# Patient Record
Sex: Female | Born: 1935
Health system: Southern US, Community
[De-identification: ages and names within clinical notes are randomized; demographics above are authoritative.]

## PROBLEM LIST (undated history)

## (undated) DIAGNOSIS — I739 Peripheral vascular disease, unspecified: Secondary | ICD-10-CM

## (undated) DIAGNOSIS — I2510 Atherosclerotic heart disease of native coronary artery without angina pectoris: Secondary | ICD-10-CM

## (undated) DIAGNOSIS — I495 Sick sinus syndrome: Secondary | ICD-10-CM

## (undated) DIAGNOSIS — E039 Hypothyroidism, unspecified: Secondary | ICD-10-CM

## (undated) DIAGNOSIS — I10 Essential (primary) hypertension: Secondary | ICD-10-CM

## (undated) DIAGNOSIS — G729 Myopathy, unspecified: Secondary | ICD-10-CM

## (undated) DIAGNOSIS — K5792 Diverticulitis of intestine, part unspecified, without perforation or abscess without bleeding: Secondary | ICD-10-CM

## (undated) DIAGNOSIS — C801 Malignant (primary) neoplasm, unspecified: Secondary | ICD-10-CM

## (undated) DIAGNOSIS — E785 Hyperlipidemia, unspecified: Secondary | ICD-10-CM

## (undated) DIAGNOSIS — T8859XA Other complications of anesthesia, initial encounter: Secondary | ICD-10-CM

## (undated) DIAGNOSIS — I6529 Occlusion and stenosis of unspecified carotid artery: Secondary | ICD-10-CM

## (undated) DIAGNOSIS — K148 Other diseases of tongue: Secondary | ICD-10-CM

## (undated) DIAGNOSIS — I499 Cardiac arrhythmia, unspecified: Secondary | ICD-10-CM

## (undated) HISTORY — PX: CATARACT EXTRACTION, BILATERAL: SHX1313

## (undated) HISTORY — DX: Peripheral vascular disease, unspecified: I73.9

## (undated) HISTORY — PX: SALIVARY GLAND SURGERY: SHX768

## (undated) HISTORY — PX: CORONARY ANGIOPLASTY: SHX604

## (undated) HISTORY — DX: Hyperlipidemia, unspecified: E78.5

## (undated) HISTORY — PX: SKIN CANCER EXCISION: SHX779

## (undated) HISTORY — DX: Cardiac arrhythmia, unspecified: I49.9

## (undated) HISTORY — DX: Essential (primary) hypertension: I10

## (undated) HISTORY — DX: Myopathy, unspecified: G72.9

## (undated) HISTORY — PX: CORONARY STENT PLACEMENT: SHX6853

## (undated) HISTORY — DX: Other diseases of tongue: K14.8

## (undated) HISTORY — DX: Occlusion and stenosis of unspecified carotid artery: I65.29

## (undated) HISTORY — PX: SALPINGECTOMY: SHX328

## (undated) HISTORY — PX: COLONOSCOPY: SHX174

## (undated) HISTORY — DX: Atherosclerotic heart disease of native coronary artery without angina pectoris: I25.10

## (undated) HISTORY — DX: Sick sinus syndrome: I49.5

## (undated) HISTORY — PX: APPENDECTOMY: SHX54

## (undated) HISTORY — PX: MULTIPLE TOOTH EXTRACTIONS: SHX2053

## (undated) HISTORY — PX: CAROTID ENDARTERECTOMY: SUR193

---

## 2016-08-09 ENCOUNTER — Telehealth: Payer: Self-pay | Admitting: Cardiology

## 2016-08-09 NOTE — Telephone Encounter (Signed)
Received records from [facility] for appointment on 09/01/16 with Dr Percival Spanish.  Records given to [facility] (medical records) for Dr Hochrein's schedule on 09/01/16. lp

## 2016-08-31 NOTE — Progress Notes (Signed)
Cardiology Office Note   Date:  09/03/2016   ID:  Jasmin Crane, DOB 1932/03/07, MRN [id number]  PCP:  No primary care provider on file.  Cardiologist:   Minus Breeding, MD  Referring:  Self  Chief Complaint  Patient presents with  . Coronary Artery Disease      History of Present Illness: Jasmin Crane is a 83 y.o. female who presents for evaluation of coronary artery disease. She was living in [place] and is now moving here to live with her daughter. First diagnosis of coronary disease was apparently in 1998. She had stenting of her right coronary artery that time. She's had a well preserved ejection fraction over the years. Her last echocardiogram in August of last year demonstrated an EF of 60%. She does have some aortic sclerosis with moderate aortic insufficiency area she does have carotid stenosis with right 80% stenosis and left 15-49% stenosis. She has some moderately had normal ABIs left greater than right. Does have some symptoms of claudication. She's never had any neurologic symptoms area she had a stress perfusion study in August 2017 this demonstrated moderate to high suspicion for ischemia. However, this was followed up with coronary CTA. She had a long calcification of the left main and proximal LAD but the severity of this could not be quantified secondary to motion artifact. There was moderate stenosis of the left circumflex. The right coronary artery could not be assessed secondary to artifact. It was decided however manage her medically. He was suggested. She will undergo carotid endarterectomy she would likely need cardiac catheterization.  She comes today to establish as a new patient. She does quite well. She was walking when she was in [place]. She's not started that yet here. However, with her activities she denies any cardiovascular symptoms. The patient denies any new symptoms such as chest discomfort, neck or arm discomfort. There has been no new shortness of  breath, PND or orthopnea. There have been no reported palpitations, presyncope or syncope.   Past Medical History:  Diagnosis Date  . Arrhythmia   . Carotid artery occlusion   . Claudication of lower extremity ([place])   . Coronary artery disease   . Hyperlipidemia   . Hypertension   . Myopathy   . Paralysis of tongue   . Sick sinus syndrome [facility])     Past Surgical History:  Procedure Laterality Date  . APPENDECTOMY    . CAROTID ENDARTERECTOMY    . CATARACT EXTRACTION, BILATERAL    . CESAREAN SECTION    . CORONARY ANGIOPLASTY    . SALPINGECTOMY       Current Outpatient Prescriptions  Medication Sig Dispense Refill  . amLODipine (NORVASC) 5 MG tablet Take 5 mg by mouth daily.    [name] aspirin 325 MG tablet Take 325 mg by mouth daily.    [name] ezetimibe (ZETIA) 10 MG tablet Take 10 mg by mouth daily.    [name] levothyroxine (SYNTHROID, LEVOTHROID) 100 MCG tablet Take 1 tablet (100 mcg total) by mouth daily before breakfast. 90 tablet 3  . metoprolol succinate (TOPROL-XL) 25 MG 24 hr tablet Take 25 mg by mouth daily.    . rosuvastatin (CRESTOR) 40 MG tablet Take 40 mg by mouth daily.     No current facility-administered medications for this visit.     Allergies:   Cozaar [losartan]; Pletaal [cilostazol]; and Scallops [shellfish allergy]    Social History:  The patient  reports that she has quit smoking. She does not  have any smokeless tobacco history on file.   Family History:  The patient's family history includes Arthritis in her brother; Cancer in her mother; Heart attack (age of onset: 50) in her son; Heart disease (age of onset: 31) in her father; Heart failure in her sister; Lung cancer in her brother; Pancreatic cancer in her brother; Stroke in her mother.    ROS:  Please see the history of present illness.   Otherwise, review of systems are positive for none.   All other systems are reviewed and negative.    PHYSICAL EXAM: VS:  BP (!) 156/72 (BP Location: Left Arm, Patient  Position: Sitting, Cuff Size: Normal)   Pulse 82   Ht 5\' 2"  (1.575 m)   Wt 115 lb 6 oz (52.3 kg)   BMI 21.10 kg/m  , BMI Body mass index is 21.1 kg/m. GENERAL:  Well appearing HEENT:  Pupils equal round and reactive, fundi not visualized, oral mucosa unremarkable NECK:  No jugular venous distention, waveform within normal limits, carotid upstroke brisk and symmetric, bilateral bruits, no thyromegaly LYMPHATICS:  No cervical, inguinal adenopathy LUNGS:  Clear to auscultation bilaterally BACK:  No CVA tenderness CHEST:  Unremarkable HEART:  PMI not displaced or sustained,S1 and S2 within normal limits, no S3, no S4, no clicks, no rubs, no murmurs ABD:  Flat, positive bowel sounds normal in frequency in pitch, abdominal bruits, no rebound, no guarding, no midline pulsatile mass, no hepatomegaly, no splenomegaly EXT:  2 plus pulses throughout, no edema, no cyanosis no clubbing SKIN:  No rashes no nodules NEURO:  Cranial nerves II through XII grossly intact, motor grossly intact throughout PSYCH:  Cognitively intact, oriented to person place and time    EKG:  EKG is ordered today. The ekg ordered today demonstrates sinus rhythm with sinus arrhythmia, axis within normal limits, intervals within normal limits, diffuse nonspecific T-wave flattening.   Recent Labs: 09/01/2016: ALT 16; BUN 18; Creat 0.93; Hemoglobin 12.7; Platelets 175; Potassium 4.3; Sodium 142; TSH 0.20    Lipid Panel    Component Value Date/Time   CHOL 162 09/01/2016 1226   TRIG 75 09/01/2016 1226   HDL 82 09/01/2016 1226   CHOLHDL 2.0 09/01/2016 1226   VLDL 15 09/01/2016 1226   LDLCALC 65 09/01/2016 1226      Wt Readings from Last 3 Encounters:  09/01/16 115 lb 6 oz (52.3 kg)      Other studies Reviewed: Additional studies/ records that were reviewed today include: Outside extensive records. Review of the above records demonstrates:  Please see elsewhere in the note.     ASSESSMENT AND PLAN:  CAD:  The  patient has coronary disease as described. She's not having any ongoing symptoms. I will continue with aggressive risk reduction.  She can reduce her aspirin 81 mg daily.  PVD:  She has carotid stenosis as described. I'll plan repeat carotid scanning in 6 months.  DYSLIPIDEMIA:  She'll have a lipid profile today with other labs. The goal will be an LDL in the 70s.  HTN:  Her blood pressure is well controlled and she'll continue the meds as listed.  HYPOTHYROIDISM:  I will check a TSH.   AI:  I will follow up with echoes in the future.  Surprisingly I don't hear a murmur today.  Current medicines are reviewed at length with the patient today.  The patient does not have concerns regarding medicines.  The following changes have been made:  no change  Labs/ tests ordered today  include:   Orders Placed This Encounter  Procedures  . CBC  . TSH  . COMPLETE METABOLIC PANEL WITH GFR  . Lipid panel  . EKG 12-Lead     Disposition:   FU with me in 3 months.      Signed, Minus Breeding, MD  09/03/2016 8:31 PM    [place]

## 2016-09-01 ENCOUNTER — Ambulatory Visit (INDEPENDENT_AMBULATORY_CARE_PROVIDER_SITE_OTHER): Payer: Medicare Other | Admitting: Cardiology

## 2016-09-01 ENCOUNTER — Encounter: Payer: Self-pay | Admitting: Cardiology

## 2016-09-01 VITALS — BP 156/72 | HR 82 | Ht 62.0 in | Wt 115.4 lb

## 2016-09-01 DIAGNOSIS — E785 Hyperlipidemia, unspecified: Secondary | ICD-10-CM | POA: Diagnosis not present

## 2016-09-01 DIAGNOSIS — I2510 Atherosclerotic heart disease of native coronary artery without angina pectoris: Secondary | ICD-10-CM

## 2016-09-01 DIAGNOSIS — R5383 Other fatigue: Secondary | ICD-10-CM | POA: Diagnosis not present

## 2016-09-01 DIAGNOSIS — Z79899 Other long term (current) drug therapy: Secondary | ICD-10-CM | POA: Diagnosis not present

## 2016-09-01 LAB — COMPLETE METABOLIC PANEL WITH GFR
ALBUMIN: 3.9 g/dL (ref 3.6–5.1)
ALK PHOS: 51 U/L (ref 33–130)
ALT: 16 U/L (ref 6–29)
AST: 28 U/L (ref 10–35)
BUN: 18 mg/dL (ref 7–25)
CALCIUM: 9.1 mg/dL (ref 8.6–10.4)
CHLORIDE: 107 mmol/L (ref 98–110)
CO2: 28 mmol/L (ref 20–31)
Creat: 0.93 mg/dL — ABNORMAL HIGH (ref 0.60–0.88)
GFR, Est African American: 66 mL/min (ref >=60)
GFR, Est Non African American: 57 mL/min — ABNORMAL LOW (ref >=60)
GLUCOSE: 86 mg/dL (ref 65–99)
Potassium: 4.3 mmol/L (ref 3.5–5.3)
SODIUM: 142 mmol/L (ref 135–146)
Total Bilirubin: 0.5 mg/dL (ref 0.2–1.2)
Total Protein: 6.2 g/dL (ref 6.1–8.1)

## 2016-09-01 LAB — LIPID PANEL
CHOL/HDL RATIO: 2 ratio (ref <5.0)
CHOLESTEROL: 162 mg/dL (ref <200)
HDL: 82 mg/dL (ref >50)
LDL Cholesterol: 65 mg/dL (ref <100)
Triglycerides: 75 mg/dL (ref <150)
VLDL: 15 mg/dL (ref <30)

## 2016-09-01 LAB — CBC
HEMATOCRIT: 40.9 % (ref 35.0–45.0)
HEMOGLOBIN: 12.7 g/dL (ref 11.7–15.5)
MCH: 28.1 pg (ref 27.0–33.0)
MCHC: 31.1 g/dL — ABNORMAL LOW (ref 32.0–36.0)
MCV: 90.5 fL (ref 80.0–100.0)
MPV: 10.5 fL (ref 7.5–12.5)
Platelets: 175 10*3/uL (ref 140–400)
RBC: 4.52 MIL/uL (ref 3.80–5.10)
RDW: 14.3 % (ref 11.0–15.0)
WBC: 5.2 10*3/uL (ref 3.8–10.8)

## 2016-09-01 LAB — TSH: TSH: 0.2 mIU/L — ABNORMAL LOW

## 2016-09-01 MED ORDER — LEVOTHYROXINE SODIUM 100 MCG PO TABS: 100.0000 ug | ORAL_TABLET | Freq: Every day | ORAL | 3 refills | Status: DC

## 2016-09-01 NOTE — Patient Instructions (Signed)
Medication Instructions:  Continue current medications  Labwork: CBC, CMP, TSH, Fasting Lipids  Testing/Procedures: None Ordered  Follow-Up: Your physician recommends that you schedule a follow-up appointment in: 3 Months   Any Other Special Instructions Will Be Listed Below (If Applicable).    If you need a refill on your cardiac medications before your next appointment, please call your pharmacy.

## 2016-09-03 ENCOUNTER — Encounter: Payer: Self-pay | Admitting: Cardiology

## 2016-09-06 ENCOUNTER — Encounter: Payer: Self-pay | Admitting: Cardiology

## 2016-09-15 ENCOUNTER — Telehealth: Payer: Self-pay | Admitting: *Deleted

## 2016-09-15 DIAGNOSIS — Z1329 Encounter for screening for other suspected endocrine disorder: Secondary | ICD-10-CM

## 2016-09-15 DIAGNOSIS — E079 Disorder of thyroid, unspecified: Secondary | ICD-10-CM

## 2016-09-15 NOTE — Telephone Encounter (Signed)
-----   Message from Minus Breeding, MD sent at 09/10/2016 10:07 AM EST ----- Labs OK except TSH is low.  Please draw T3, T4.  Call Ms. Cutsforth with the results

## 2016-09-15 NOTE — Telephone Encounter (Signed)
Spoke with pt about her blood work, informed pt Dr Percival Spanish will like to check T3 T4 lab, pt stated that she is going to [place] on Saturday and will be back February 3rd, told pt she can get done when she returned, pt voice understanding

## 2016-10-10 LAB — T4, FREE: Free T4: 1.2 ng/dL (ref 0.8–1.8)

## 2016-10-10 LAB — T3, FREE: T3, Free: 2.4 pg/mL (ref 2.3–4.2)

## 2016-12-03 NOTE — Progress Notes (Signed)
Cardiology Office Note   Date:  12/04/2016   ID:  Jasmin Crane, DOB 04-27-1932, MRN [id number]  PCP:  No PCP Per Patient  Cardiologist:   Minus Breeding, MD  Referring:  Self  Chief Complaint  Patient presents with  . Leg Pain      History of Present Illness: Jasmin Crane is a 83 y.o. female who presents for evaluation of coronary artery disease. She was living in [place] and has moved here to live with her daughter. Her first diagnosis of coronary disease was apparently in 1998. She had stenting of her right coronary artery that time. She's had a well preserved ejection fraction over the years. Her last echocardiogram in August of last year demonstrated an EF of 60%. She does have some aortic sclerosis with moderate aortic insufficiency.  She does have carotid stenosis with right 80% stenosis and left 25 -49% stenosis. She has some moderately abnormal ABIs left greater than right. She had a stress perfusion study in August 2017 that demonstrated moderate to high suspicion for ischemia. However, this was followed up with coronary CTA. She had a long calcification of the left main and proximal LAD but the severity of this could not be quantified secondary to motion artifact. There was moderate stenosis of the left circumflex. The right coronary artery could not be assessed secondary to artifact. It was decided however manage her medically. It was suggested that if she were to undergo carotid endarterectomy she would likely need cardiac catheterization.  This is her second visit with me.  She has been doing well except for leg pain.  This seems to happen more at night with cramping in her foot. It was in her calf previously. She does have some symptoms of claudication as well but she is not walking as much as she was in [place] and doesn't know if this is worse or better.  She denies any chest pressure, neck or arm discomfort. She's had no new shortness of breath, PND or orthopnea. She's had  no weight gain or edema.   Past Medical History:  Diagnosis Date  . Arrhythmia   . Carotid artery occlusion   . Claudication of lower extremity ([place])   . Coronary artery disease   . Hyperlipidemia   . Hypertension   . Myopathy   . Paralysis of tongue   . Sick sinus syndrome [facility])     Past Surgical History:  Procedure Laterality Date  . APPENDECTOMY    . CAROTID ENDARTERECTOMY    . CATARACT EXTRACTION, BILATERAL    . CESAREAN SECTION    . CORONARY ANGIOPLASTY    . SALPINGECTOMY       Current Outpatient Prescriptions  Medication Sig Dispense Refill  . amLODipine (NORVASC) 5 MG tablet Take 5 mg by mouth daily.    [name] aspirin 325 MG tablet Take 325 mg by mouth daily.    [name] ezetimibe (ZETIA) 10 MG tablet Take 10 mg by mouth daily.    [name] levothyroxine (SYNTHROID, LEVOTHROID) 100 MCG tablet Take 1 tablet (100 mcg total) by mouth daily before breakfast. 90 tablet 3  . metoprolol succinate (TOPROL-XL) 25 MG 24 hr tablet Take 25 mg by mouth daily.    . rosuvastatin (CRESTOR) 40 MG tablet Take 40 mg by mouth daily.     No current facility-administered medications for this visit.     Allergies:   Cozaar [losartan]; Pletaal [cilostazol]; and Scallops [shellfish allergy]     ROS:  Please see the  history of present illness.   Otherwise, review of systems are positive for foot cramping.   All other systems are reviewed and negative.    PHYSICAL EXAM: VS:  BP 122/62   Pulse 68   Ht 5\' 2"  (1.575 m)   Wt 116 lb 6.4 oz (52.8 kg)   SpO2 96%   BMI 21.29 kg/m  , BMI Body mass index is 21.29 kg/m. GENERAL:  Well appearing NECK:  No jugular venous distention, waveform within normal limits, carotid upstroke brisk and symmetric, bilateral bruits, no thyromegaly LUNGS:  Clear to auscultation bilaterally BACK:  No CVA tenderness CHEST:  Unremarkable HEART:  PMI not displaced or sustained,S1 and S2 within normal limits, no S3, no S4, no clicks, no rubs, no murmurs ABD:  Flat, positive  bowel sounds normal in frequency in pitch, positive abdominal bruits, no rebound, no guarding, no midline pulsatile mass, no hepatomegaly, no splenomegaly EXT:  2 plus pulses throughout, no edema, no cyanosis no clubbing    EKG:  EKG is not ordered today.   Recent Labs: 09/01/2016: ALT 16; BUN 18; Creat 0.93; Hemoglobin 12.7; Platelets 175; Potassium 4.3; Sodium 142; TSH 0.20    Lipid Panel    Component Value Date/Time   CHOL 162 09/01/2016 1226   TRIG 75 09/01/2016 1226   HDL 82 09/01/2016 1226   CHOLHDL 2.0 09/01/2016 1226   VLDL 15 09/01/2016 1226   LDLCALC 65 09/01/2016 1226      Wt Readings from Last 3 Encounters:  12/04/16 116 lb 6.4 oz (52.8 kg)  09/01/16 115 lb 6 oz (52.3 kg)      Other studies Reviewed: Additional studies/ records that were reviewed today include: Labs Review of the above records demonstrates:  See below   ASSESSMENT AND PLAN:  CAD:    The patient has coronary disease as described. She's not having any ongoing symptoms. I will continue with aggressive risk reduction.  She will continue with meds as listed.   PVD:  She has carotid stenosis as described. I'll plan repeat carotid Dopplers.  I will also order ABIs  DYSLIPIDEMIA:     Her LDL is at target.  She will remain on the meds as listed.  HTN:   Her blood pressure is well controlled and she'll continue the meds as listed.  No change in therapyl  CLAUDICATION:  I will check ABIs as above.   HYPOTHYROIDISM:   T3 and T4 were OK.    AI:     I will follow up with echoes in the future.   She also has moderate TR and MR that we will follow.   Current medicines are reviewed at length with the patient today.  The patient does not have concerns regarding medicines.  The following changes have been made:   None  Labs/ tests ordered today include:    No orders of the defined types were placed in this encounter.    Disposition:   FU with me in 6 months.      Signed, Minus Breeding, MD    12/04/2016 11:42 AM    [facility]

## 2016-12-04 ENCOUNTER — Encounter: Payer: Self-pay | Admitting: Cardiology

## 2016-12-04 ENCOUNTER — Ambulatory Visit (INDEPENDENT_AMBULATORY_CARE_PROVIDER_SITE_OTHER): Payer: Medicare Other | Admitting: Cardiology

## 2016-12-04 VITALS — BP 122/62 | HR 68 | Ht 62.0 in | Wt 116.4 lb

## 2016-12-04 DIAGNOSIS — I10 Essential (primary) hypertension: Secondary | ICD-10-CM | POA: Diagnosis not present

## 2016-12-04 DIAGNOSIS — I351 Nonrheumatic aortic (valve) insufficiency: Secondary | ICD-10-CM | POA: Diagnosis not present

## 2016-12-04 DIAGNOSIS — I2510 Atherosclerotic heart disease of native coronary artery without angina pectoris: Secondary | ICD-10-CM | POA: Diagnosis not present

## 2016-12-04 DIAGNOSIS — I6523 Occlusion and stenosis of bilateral carotid arteries: Secondary | ICD-10-CM | POA: Diagnosis not present

## 2016-12-04 DIAGNOSIS — I739 Peripheral vascular disease, unspecified: Secondary | ICD-10-CM

## 2016-12-04 NOTE — Patient Instructions (Signed)
Medication Instructions:  Continue current medications  Labwork: None Ordered  Testing/Procedures: Your physician has requested that you have a carotid duplex. This test is an ultrasound of the carotid arteries in your neck. It looks at blood flow through these arteries that supply the brain with blood. Allow one hour for this exam. There are no restrictions or special instructions.  Your physician has requested that you have an ankle brachial index (ABI). During this test an ultrasound and blood pressure cuff are used to evaluate the arteries that supply the arms and legs with blood. Allow thirty minutes for this exam. There are no restrictions or special instructions.  Follow-Up: Your physician wants you to follow-up in: 6 Months. You will receive a reminder letter in the mail two months in advance. If you don't receive a letter, please call our office to schedule the follow-up appointment.   Any Other Special Instructions Will Be Listed Below (If Applicable).   If you need a refill on your cardiac medications before your next appointment, please call your pharmacy.

## 2017-01-29 ENCOUNTER — Other Ambulatory Visit: Payer: Self-pay | Admitting: *Deleted

## 2017-01-29 MED ORDER — AMLODIPINE BESYLATE 5 MG PO TABS: 5.0000 mg | ORAL_TABLET | Freq: Every day | ORAL | 2 refills | Status: DC

## 2017-02-08 ENCOUNTER — Other Ambulatory Visit: Payer: Self-pay | Admitting: Cardiology

## 2017-02-12 ENCOUNTER — Other Ambulatory Visit: Payer: Self-pay | Admitting: Cardiology

## 2017-02-12 DIAGNOSIS — I739 Peripheral vascular disease, unspecified: Secondary | ICD-10-CM

## 2017-02-20 ENCOUNTER — Ambulatory Visit (HOSPITAL_COMMUNITY)
Admission: RE | Admit: 2017-02-20 | Discharge: 2017-02-20 | Disposition: A | Discharge status: Home / Self Care | Payer: Medicare Other | Source: Ambulatory Visit | Attending: Cardiovascular Disease | Admitting: Cardiovascular Disease

## 2017-02-20 ENCOUNTER — Ambulatory Visit (HOSPITAL_COMMUNITY)
Admission: RE | Admit: 2017-02-20 | Discharge: 2017-02-20 | Disposition: A | Discharge status: Home / Self Care | Payer: Medicare Other | Source: Ambulatory Visit | Attending: Cardiology | Admitting: Cardiology

## 2017-02-20 DIAGNOSIS — I6523 Occlusion and stenosis of bilateral carotid arteries: Secondary | ICD-10-CM | POA: Insufficient documentation

## 2017-02-20 DIAGNOSIS — I739 Peripheral vascular disease, unspecified: Secondary | ICD-10-CM | POA: Diagnosis not present

## 2017-02-20 DIAGNOSIS — I70201 Unspecified atherosclerosis of native arteries of extremities, right leg: Secondary | ICD-10-CM | POA: Diagnosis not present

## 2017-02-20 DIAGNOSIS — I700 Atherosclerosis of aorta: Secondary | ICD-10-CM | POA: Insufficient documentation

## 2017-02-20 DIAGNOSIS — I708 Atherosclerosis of other arteries: Secondary | ICD-10-CM | POA: Insufficient documentation

## 2017-02-20 DIAGNOSIS — I70292 Other atherosclerosis of native arteries of extremities, left leg: Secondary | ICD-10-CM | POA: Insufficient documentation

## 2017-02-26 ENCOUNTER — Telehealth: Payer: Self-pay | Admitting: *Deleted

## 2017-02-26 NOTE — Telephone Encounter (Addendum)
-----   Message from Minus Breeding, MD sent at 02/24/2017  1:25 PM EDT ----- Abnormal ABIs.  Please call patient and refer to Dr. Gwenlyn Found for further discussion of her claudication and abnormal ABIs.   Left message for pt to call

## 2017-02-27 NOTE — Telephone Encounter (Signed)
New Message     Pt is following up with Hilda Blades for message left

## 2017-02-27 NOTE — Telephone Encounter (Signed)
Unable to reach pt or leave a message  

## 2017-03-01 NOTE — Telephone Encounter (Signed)
Spoke with pt, aware of dopplers results and follow up scheduled for eval with dr berry at patient convenience. Copy of dopplers placed at the front desk for patient pick up.

## 2017-03-06 ENCOUNTER — Encounter: Payer: Self-pay | Admitting: Cardiovascular Disease

## 2017-03-06 ENCOUNTER — Ambulatory Visit (INDEPENDENT_AMBULATORY_CARE_PROVIDER_SITE_OTHER): Payer: Medicare Other | Admitting: Cardiovascular Disease

## 2017-03-06 VITALS — BP 141/69 | HR 75 | Ht 62.0 in | Wt 113.0 lb

## 2017-03-06 DIAGNOSIS — E7800 Pure hypercholesterolemia, unspecified: Secondary | ICD-10-CM

## 2017-03-06 DIAGNOSIS — I6523 Occlusion and stenosis of bilateral carotid arteries: Secondary | ICD-10-CM

## 2017-03-06 DIAGNOSIS — I10 Essential (primary) hypertension: Secondary | ICD-10-CM | POA: Diagnosis not present

## 2017-03-06 DIAGNOSIS — I739 Peripheral vascular disease, unspecified: Secondary | ICD-10-CM | POA: Diagnosis not present

## 2017-03-06 DIAGNOSIS — I2583 Coronary atherosclerosis due to lipid rich plaque: Secondary | ICD-10-CM | POA: Diagnosis not present

## 2017-03-06 DIAGNOSIS — E785 Hyperlipidemia, unspecified: Secondary | ICD-10-CM | POA: Insufficient documentation

## 2017-03-06 DIAGNOSIS — I2510 Atherosclerotic heart disease of native coronary artery without angina pectoris: Secondary | ICD-10-CM

## 2017-03-06 DIAGNOSIS — I779 Disorder of arteries and arterioles, unspecified: Secondary | ICD-10-CM | POA: Insufficient documentation

## 2017-03-06 NOTE — Assessment & Plan Note (Signed)
Jasmin Crane has had left carotid endarterectomy 1998 [place]. Recent Dopplers performed 02/20/17 showed moderate bilateral ICA stenosis probably in the 60% range followed by Dr. Percival Spanish

## 2017-03-06 NOTE — Patient Instructions (Signed)
Medication Instructions: Your physician recommends that you continue on your current medications as directed. Please refer to the Current Medication list given to you today.  Testing: Your physician has requested that you have a lower extremity arterial duplex. During this test, ultrasound is used to evaluate arterial blood flow in the legs. Allow one hour for this exam. There are no restrictions or special instructions.  Your physician has requested that you have an ankle brachial index (ABI). During this test an ultrasound and blood pressure cuff are used to evaluate the arteries that supply the arms and legs with blood. Allow thirty minutes for this exam. There are no restrictions or special instructions. (In 1 year)   Follow-Up: Your physician recommends that you schedule a follow-up appointment as needed with Dr. Gwenlyn Found.

## 2017-03-06 NOTE — Progress Notes (Signed)
03/06/2017 Jasmin Crane   February 26, 1932  [id number]  Primary Physician Patient, No Pcp Per Primary Cardiologist: Lorretta Harp MD Jasmin Crane  HPI:  [name] is a delightful 83 year old thin-appearing widowed Caucasian female mother of 2 children (accompanied by her daughter [name] ), a mother to 2 grandchildren who recently relocated from [place] 6 months ago to be closer to her daughter. She was referred to me by Dr. Percival Spanish for peripheral vascular evaluation because of leg pain. She has a history of coronary disease well outlined in her previous office note as well as carotid artery disease now 20 years status post carotid endarterectomy on the left side in the [place]. She does complain of bilateral calf claudication as well as nocturnal pain probably related to restless leg syndrome. Recent Dopplers performed 02/20/17 revealed ABIs of 8.6 range bilaterally with moderately elevated iliac velocities and SFA occlusions.   Current Outpatient Prescriptions  Medication Sig Dispense Refill  . amLODipine (NORVASC) 5 MG tablet Take 1 tablet (5 mg total) by mouth daily. 90 tablet 2  . aspirin 325 MG tablet Take 325 mg by mouth daily.    [name] ezetimibe (ZETIA) 10 MG tablet Take 10 mg by mouth daily.    [name] levothyroxine (SYNTHROID, LEVOTHROID) 100 MCG tablet Take 1 tablet (100 mcg total) by mouth daily before breakfast. 90 tablet 3  . metoprolol succinate (TOPROL-XL) 25 MG 24 hr tablet TAKE 1 TABLET BY MOUTH EVERY DAY 90 tablet 1  . rosuvastatin (CRESTOR) 40 MG tablet TAKE 1 TABLET BY MOUTH AT BEDTIME 90 tablet 1   No current facility-administered medications for this visit.     Allergies  Allergen Reactions  . Cozaar [Losartan] Nausea Only  . Pletaal [Cilostazol] Palpitations  . Scallops [Shellfish Allergy] Swelling    Social History   Social History  . Marital status: Widowed    Spouse name: [name]  . Number of children: 3  . Years of education: [name]   Occupational  History  .      Worked for [employer] History Main Topics  . Smoking status: Former Research scientist (life sciences)  . Smokeless tobacco: Never Used     Comment: Quit tobacco 25 years ago  . Alcohol use Not on file  . Drug use: Unknown  . Sexual activity: Not on file   Other Topics Concern  . Not on file   Social History Narrative  . No narrative on file     Review of Systems: General: negative for chills, fever, night sweats or weight changes.  Cardiovascular: negative for chest pain, dyspnea on exertion, edema, orthopnea, palpitations, paroxysmal nocturnal dyspnea or shortness of breath Dermatological: negative for rash Respiratory: negative for cough or wheezing Urologic: negative for hematuria Abdominal: negative for nausea, vomiting, diarrhea, bright red blood per rectum, melena, or hematemesis Neurologic: negative for visual changes, syncope, or dizziness All other systems reviewed and are otherwise negative except as noted above.    Blood pressure (!) 141/69, pulse 75, height 5\' 2"  (1.575 m), weight 113 lb (51.3 kg).  General appearance: alert and no distress Neck: no adenopathy, no JVD, supple, symmetrical, trachea midline, thyroid not enlarged, symmetric, no tenderness/mass/nodules and Loud bilateral carotid bruits Lungs: no adenopathy, no JVD, supple, symmetrical, trachea midline, thyroid not enlarged, symmetric, no tenderness/mass/nodules and Loud bilateral carotid bruits Heart: regular rate and rhythm, S1, S2 normal, no murmur, click, rub or gallop Extremities: extremities normal, atraumatic, no cyanosis or edema  EKG sinus rhythm at  75 with nonspecific ST and T-wave changes. I personally reviewed this EKG  ASSESSMENT AND PLAN:   Claudication in peripheral vascular disease ([place]) History of left thalamic claudication for the last 15 years which is mildly lifestyle limiting especially walking up an incline. She also has nocturnal pain probably related to restless leg syndrome. Recent  Dopplers performed 02/20/17 revealed moderate iliac disease bilaterally with occluded SFAs and ABIs in the 0.6 range bilaterally. We did talk about endovascular therapy of her iliac arteries initially however the patient wishes to be treated conservatively at this time. We will continue to monitor her noninvasively. Unfortunately, she was tried on cilostazol and had a reaction to this.  Carotid artery disease [facility]) Ms. [name] has had left carotid endarterectomy 1998 [place]. Recent Dopplers performed 02/20/17 showed moderate bilateral ICA stenosis probably in the 60% range followed by Dr. Gerre Pebbles MD [facility], [facility] 03/06/2017 9:08 AM

## 2017-03-06 NOTE — Assessment & Plan Note (Addendum)
History of left thalamic claudication for the last 15 years which is mildly lifestyle limiting especially walking up an incline. She also has nocturnal pain probably related to restless leg syndrome. Recent Dopplers performed 02/20/17 revealed moderate iliac disease bilaterally with occluded SFAs and ABIs in the 0.6 range bilaterally. We did talk about endovascular therapy of her iliac arteries initially however the patient wishes to be treated conservatively at this time. We will continue to monitor her noninvasively. Unfortunately, she was tried on cilostazol and had a reaction to this.

## 2017-06-02 NOTE — Progress Notes (Signed)
Cardiology Office Note   Date:  06/05/2017   ID:  Javen Ridings, DOB 1932-01-31, MRN [id number]  PCP:  Patient, No Pcp Per  Cardiologist:   Minus Breeding, MD  Referring:  Self  Chief Complaint  Patient presents with  . Coronary Artery Disease      History of Present Illness: Jasmin Crane is a 83 y.o. female who presents for evaluation of coronary artery disease. She was living in [place] and moved here with her daughter.  The first diagnosis of coronary disease was apparently in 1998. She had stenting of her right coronary artery that time. She's had a well preserved ejection fraction over the years. Her last echocardiogram in August of last year demonstrated an EF of 60%. She does have some aortic sclerosis with moderate aortic insufficiency.  She does have carotid stenosis with right 80% stenosis and left 15-49% stenosis. She has some moderately had normal ABIs left greater than right. Does have some symptoms of claudication. She's never had any neurologic symptoms.  She had a stress perfusion study in August 2017 that demonstrated moderate to high suspicion for ischemia. However, this was followed up with coronary CTA. She had a long calcification of the left main and proximal LAD but the severity of this could not be quantified secondary to motion artifact. There was moderate stenosis of the left circumflex. The right coronary artery could not be assessed secondary to artifact. It was decided however manage her medically.   If she had undergo carotid endarterectomy she would likely need cardiac catheterization.    Since I last saw her she has done well. She has claudication and saw Dr. Gwenlyn Found with plans as outlined below.  Despite the pain in the legs she was able to walk all around [place] since I last saw her.  The patient denies any new symptoms such as chest discomfort, neck or arm discomfort. There has been no new shortness of breath, PND or orthopnea. There have been no  reported palpitations, presyncope or syncope.  Past Medical History:  Diagnosis Date  . Arrhythmia   . Carotid artery occlusion   . Claudication of lower extremity ([place])   . Coronary artery disease   . Hyperlipidemia   . Hypertension   . Myopathy   . Paralysis of tongue   . Sick sinus syndrome [facility])     Past Surgical History:  Procedure Laterality Date  . APPENDECTOMY    . CAROTID ENDARTERECTOMY    . CATARACT EXTRACTION, BILATERAL    . CESAREAN SECTION    . CORONARY ANGIOPLASTY    . SALPINGECTOMY       Current Outpatient Prescriptions  Medication Sig Dispense Refill  . amLODipine (NORVASC) 5 MG tablet Take 1 tablet (5 mg total) by mouth daily. 90 tablet 2  . aspirin 325 MG tablet Take 325 mg by mouth daily.    [name] ezetimibe (ZETIA) 10 MG tablet Take 10 mg by mouth daily.    [name] levothyroxine (SYNTHROID, LEVOTHROID) 100 MCG tablet Take 1 tablet (100 mcg total) by mouth daily before breakfast. 90 tablet 3  . metoprolol succinate (TOPROL-XL) 25 MG 24 hr tablet TAKE 1 TABLET BY MOUTH EVERY DAY 90 tablet 1  . rosuvastatin (CRESTOR) 40 MG tablet TAKE 1 TABLET BY MOUTH AT BEDTIME 90 tablet 1   No current facility-administered medications for this visit.     Allergies:   Cozaar [losartan]; Pletaal [cilostazol]; and Scallops [shellfish allergy]    ROS:  Please see the  history of present illness.   Otherwise, review of systems are positive for none.   All other systems are reviewed and negative.    PHYSICAL EXAM: VS:  BP (!) 112/56   Pulse 72   Ht 5\' 2"  (1.575 m)   Wt 111 lb 6.4 oz (50.5 kg)   BMI 20.38 kg/m  , BMI Body mass index is 20.38 kg/m.  GENERAL:  Well appearing NECK:  No jugular venous distention, waveform within normal limits, carotid upstroke brisk and symmetric, postive bruits, no thyromegaly LUNGS:  Clear to auscultation bilaterally CHEST:  Unremarkable HEART:  PMI not displaced or sustained,S1 and S2 within normal limits, no S3, no S4, no clicks, no rubs, soft  diastolic intermittent murmur at the mid left sternal border. ABD:  Flat, positive bowel sounds normal in frequency in pitch, positive bruits, no rebound, no guarding, no midline pulsatile mass, no hepatomegaly, no splenomegaly EXT:  2 plus pulses throughout, no edema, no cyanosis no clubbing, high pitched left femoral bruit.     GENERAL:  Well appearing HEENT:  Pupils equal round and reactive, fundi not visualized, oral mucosa unremarkable NECK:  No jugular venous distention, waveform within normal limits, carotid upstroke brisk and symmetric, bilateral bruits, no thyromegaly LYMPHATICS:  No cervical, inguinal adenopathy LUNGS:  Clear to auscultation bilaterally BACK:  No CVA tenderness CHEST:  Unremarkable HEART:  PMI not displaced or sustained,S1 and S2 within normal limits, no S3, no S4, no clicks, no rubs, no murmurs ABD:  Flat, positive bowel sounds normal in frequency in pitch, abdominal bruits, no rebound, no guarding, no midline pulsatile mass, no hepatomegaly, no splenomegaly EXT:  2 plus pulses upper 2 plus, DP/PT decreased , no edema, no cyanosis no clubbing SKIN:  No rashes no nodules NEURO:  Cranial nerves II through XII grossly intact, motor grossly intact throughout PSYCH:  Cognitively intact, oriented to person place and time    EKG:  EKG is not ordered today.   Recent Labs: 09/01/2016: ALT 16; BUN 18; Creat 0.93; Hemoglobin 12.7; Platelets 175; Potassium 4.3; Sodium 142; TSH 0.20    Lipid Panel    Component Value Date/Time   CHOL 162 09/01/2016 1226   TRIG 75 09/01/2016 1226   HDL 82 09/01/2016 1226   CHOLHDL 2.0 09/01/2016 1226   VLDL 15 09/01/2016 1226   LDLCALC 65 09/01/2016 1226      Wt Readings from Last 3 Encounters:  06/04/17 111 lb 6.4 oz (50.5 kg)  03/06/17 113 lb (51.3 kg)  12/04/16 116 lb 6.4 oz (52.8 kg)      Other studies Reviewed: Additional studies/ records that were reviewed today include: None Review of the above records demonstrates:      ASSESSMENT AND PLAN:  CAD:  The patient has coronary disease   as described. She does not want to reduce her ASA to 81 mg.  No change in therapy or further testing is indicated.  PVD:  She saw Dr. Gwenlyn Found.  She has reduced ABIs.  She did not tolerate Pletal.  She is going to be managed conservatively.   DYSLIPIDEMIA:  She had an excellent lipid profile.  She will continue with meds as listed.   HTN:  The blood pressure is at target. No change in medications is indicated. We will continue with therapeutic lifestyle changes (TLC).  HYPOTHYROIDISM:  TSH was low but T3 and T4 were OK.   AI:    I will follow up with echoes in August as it will be two  years since the previous.   AAA:   She had this reported when she lived in [place].  I will repeat an ultrasound.   Current medicines are reviewed at length with the patient today.  The patient does not have concerns regarding medicines.  The following changes have been made:  None  Labs/ tests ordered today include:    Orders Placed This Encounter  Procedures  . ECHOCARDIOGRAM COMPLETE     Disposition:   FU with me in August.   Signed, Minus Breeding, MD  06/05/2017 9:02 PM    [place]

## 2017-06-04 ENCOUNTER — Ambulatory Visit (INDEPENDENT_AMBULATORY_CARE_PROVIDER_SITE_OTHER): Payer: Medicare Other | Admitting: Cardiology

## 2017-06-04 ENCOUNTER — Encounter: Payer: Self-pay | Admitting: Cardiology

## 2017-06-04 VITALS — BP 112/56 | HR 72 | Ht 62.0 in | Wt 111.4 lb

## 2017-06-04 DIAGNOSIS — I2510 Atherosclerotic heart disease of native coronary artery without angina pectoris: Secondary | ICD-10-CM

## 2017-06-04 DIAGNOSIS — I714 Abdominal aortic aneurysm, without rupture, unspecified: Secondary | ICD-10-CM

## 2017-06-04 DIAGNOSIS — I6523 Occlusion and stenosis of bilateral carotid arteries: Secondary | ICD-10-CM

## 2017-06-04 DIAGNOSIS — I10 Essential (primary) hypertension: Secondary | ICD-10-CM

## 2017-06-04 DIAGNOSIS — E785 Hyperlipidemia, unspecified: Secondary | ICD-10-CM

## 2017-06-04 DIAGNOSIS — I351 Nonrheumatic aortic (valve) insufficiency: Secondary | ICD-10-CM

## 2017-06-04 NOTE — Patient Instructions (Signed)
Medication Instructions:  Continue current medications  If you need a refill on your cardiac medications before your next appointment, please call your pharmacy.  Labwork: None Ordered   Testing/Procedures: Your physician has requested that you have an echocardiogram in August 2019. Echocardiography is a painless test that uses sound waves to create images of your heart. It provides your doctor with information about the size and shape of your heart and how well your heart's chambers and valves are working. This procedure takes approximately one hour. There are no restrictions for this procedure.  Your physician has requested that you have an abdominal aorta duplex. During this test, an ultrasound is used to evaluate the aorta. Allow 30 minutes for this exam. Do not eat after midnight the day before and avoid carbonated beverages   Follow-Up: Your physician wants you to follow-up in: 1 Year. You should receive a reminder letter in the mail two months in advance. If you do not receive a letter, please call our office [phone].   Thank you for choosing [facility] at [facility]!!

## 2017-06-05 ENCOUNTER — Encounter: Payer: Self-pay | Admitting: Cardiology

## 2017-06-22 ENCOUNTER — Ambulatory Visit (HOSPITAL_COMMUNITY)
Admission: RE | Admit: 2017-06-22 | Discharge: 2017-06-22 | Disposition: A | Discharge status: Home / Self Care | Payer: Medicare Other | Source: Ambulatory Visit | Attending: Cardiology | Admitting: Cardiology

## 2017-06-22 DIAGNOSIS — I714 Abdominal aortic aneurysm, without rupture, unspecified: Secondary | ICD-10-CM

## 2017-06-25 ENCOUNTER — Telehealth: Payer: Self-pay | Admitting: Cardiology

## 2017-06-25 NOTE — Telephone Encounter (Signed)
LEFT MESSAGE TO CALL BACK

## 2017-06-25 NOTE — Telephone Encounter (Signed)
New message ° ° ° °Pt verbalized that she is returning call for rn  °

## 2017-06-25 NOTE — Telephone Encounter (Signed)
Notes recorded by Minus Breeding, MD on 06/24/2017 at 7:59 PM EDT; Mild AAA at 3 cm. No follow up or therapy indicated at this time. Call Ms. Bontrager with the results  lm2cb

## 2017-06-25 NOTE — Telephone Encounter (Signed)
Follow up    Pt returning call from nurse

## 2017-06-26 ENCOUNTER — Telehealth: Payer: Self-pay | Admitting: *Deleted

## 2017-06-26 ENCOUNTER — Other Ambulatory Visit: Payer: Self-pay | Admitting: Cardiology

## 2017-06-26 DIAGNOSIS — I714 Abdominal aortic aneurysm, without rupture, unspecified: Secondary | ICD-10-CM

## 2017-06-26 NOTE — Telephone Encounter (Signed)
AAA ordered for 1 year

## 2017-06-27 ENCOUNTER — Encounter: Payer: Self-pay | Admitting: *Deleted

## 2017-06-27 NOTE — Telephone Encounter (Signed)
Lm2cb 

## 2017-06-28 NOTE — Telephone Encounter (Signed)
Letter with result mailed to pt 

## 2017-08-03 ENCOUNTER — Other Ambulatory Visit: Payer: Self-pay | Admitting: Cardiology

## 2017-11-12 ENCOUNTER — Other Ambulatory Visit: Payer: Self-pay | Admitting: Cardiology

## 2017-12-26 ENCOUNTER — Other Ambulatory Visit: Payer: Self-pay | Admitting: Family Medicine

## 2017-12-26 DIAGNOSIS — R1031 Right lower quadrant pain: Secondary | ICD-10-CM

## 2018-01-01 ENCOUNTER — Ambulatory Visit
Admission: RE | Admit: 2018-01-01 | Discharge: 2018-01-01 | Disposition: A | Discharge status: Home / Self Care | Payer: Medicare Other | Source: Ambulatory Visit | Attending: Family Medicine | Admitting: Family Medicine

## 2018-01-01 DIAGNOSIS — R1031 Right lower quadrant pain: Secondary | ICD-10-CM

## 2018-01-01 MED ORDER — IOPAMIDOL (ISOVUE-300) INJECTION 61%
100.0000 mL | Freq: Once | INTRAVENOUS | Status: AC | PRN
  Administered 2018-01-01: 100 mL via INTRAVENOUS

## 2018-01-07 ENCOUNTER — Other Ambulatory Visit: Payer: Self-pay | Admitting: Family Medicine

## 2018-01-07 DIAGNOSIS — R1031 Right lower quadrant pain: Secondary | ICD-10-CM

## 2018-02-12 ENCOUNTER — Ambulatory Visit
Admission: RE | Admit: 2018-02-12 | Discharge: 2018-02-12 | Disposition: A | Discharge status: Home / Self Care | Payer: Medicare Other | Source: Ambulatory Visit | Attending: Family Medicine | Admitting: Family Medicine

## 2018-02-12 DIAGNOSIS — R1031 Right lower quadrant pain: Secondary | ICD-10-CM

## 2018-04-04 ENCOUNTER — Other Ambulatory Visit: Payer: Self-pay

## 2018-04-04 ENCOUNTER — Encounter (INDEPENDENT_AMBULATORY_CARE_PROVIDER_SITE_OTHER): Payer: Self-pay

## 2018-04-04 ENCOUNTER — Ambulatory Visit (HOSPITAL_COMMUNITY): Payer: Medicare Other | Attending: Cardiovascular Disease

## 2018-04-04 DIAGNOSIS — E785 Hyperlipidemia, unspecified: Secondary | ICD-10-CM | POA: Diagnosis not present

## 2018-04-04 DIAGNOSIS — I351 Nonrheumatic aortic (valve) insufficiency: Secondary | ICD-10-CM

## 2018-04-04 DIAGNOSIS — I2510 Atherosclerotic heart disease of native coronary artery without angina pectoris: Secondary | ICD-10-CM | POA: Diagnosis not present

## 2018-04-04 DIAGNOSIS — I10 Essential (primary) hypertension: Secondary | ICD-10-CM | POA: Insufficient documentation

## 2018-04-04 DIAGNOSIS — I082 Rheumatic disorders of both aortic and tricuspid valves: Secondary | ICD-10-CM | POA: Diagnosis not present

## 2018-04-04 DIAGNOSIS — I499 Cardiac arrhythmia, unspecified: Secondary | ICD-10-CM | POA: Diagnosis not present

## 2018-05-06 ENCOUNTER — Other Ambulatory Visit: Payer: Self-pay | Admitting: Cardiology

## 2018-06-04 ENCOUNTER — Other Ambulatory Visit: Payer: Self-pay | Admitting: Cardiovascular Disease

## 2018-06-04 DIAGNOSIS — I739 Peripheral vascular disease, unspecified: Secondary | ICD-10-CM

## 2018-06-08 NOTE — Progress Notes (Signed)
Cardiology Office Note   Date:  06/10/2018   ID:  Jasmin Crane, DOB May 02, 1932, MRN [id number]  PCP:  Hayden Rasmussen, MD  Cardiologist:   Minus Breeding, MD  Referring:  Self  Chief Complaint  Patient presents with  . Coronary Artery Disease      History of Present Illness: Jasmin Crane is a 84 y.o. female who presents for evaluation of coronary artery disease. She was living in [place] and moved here with her daughter.  The first diagnosis of coronary disease was apparently in 1998. She had stenting of her right coronary artery that time. She's had a well preserved ejection fraction over the years. Her last echocardiogram in August of last year demonstrated an EF of 60%. She does have some aortic sclerosis with moderate aortic insufficiency.  She does have carotid stenosis with right 80% stenosis and left 15-49% stenosis. She has some moderately had normal ABIs left greater than right. Does have some symptoms of claudication. She's never had any neurologic symptoms.  She had a stress perfusion study in August 2017 that demonstrated moderate to high suspicion for ischemia. However, this was followed up with coronary CTA. She had a long calcification of the left main and proximal LAD but the severity of this could not be quantified secondary to motion artifact. There was moderate stenosis of the left circumflex. The right coronary artery could not be assessed secondary to artifact. It was decided however manage her medically.   If she had to undergo carotid endarterectomy she would likely need cardiac catheterization.  Echo demonstrated a well-preserved ejection fraction with moderate aortic insufficiency.  She has no cardiac complaints and she was last seen.  She is doing the treadmill.  She does some church activities.  She walks at home.  Her biggest issue has been some lower abdominal pain and vaginal pain.  She has had weight loss.  I reviewed a CT she had of her abdomen and  there was a question of a small mass on the pancreas with a suggestion of an ultrasound that needed to be followed up.  She is been referred to GI but she has not yet made that appointment.   Past Medical History:  Diagnosis Date  . Arrhythmia   . Carotid artery occlusion   . Claudication of lower extremity ([place])   . Coronary artery disease   . Hyperlipidemia   . Hypertension   . Myopathy   . Paralysis of tongue   . Sick sinus syndrome [facility])     Past Surgical History:  Procedure Laterality Date  . APPENDECTOMY    . CAROTID ENDARTERECTOMY    . CATARACT EXTRACTION, BILATERAL    . CESAREAN SECTION    . CORONARY ANGIOPLASTY    . SALPINGECTOMY       Current Outpatient Medications  Medication Sig Dispense Refill  . amLODipine (NORVASC) 2.5 MG tablet Take 2.5 mg by mouth daily.    [name] aspirin 325 MG tablet Take 325 mg by mouth daily.    [name] levothyroxine (SYNTHROID, LEVOTHROID) 100 MCG tablet TAKE 1 TABLET BY MOUTH EVERY DAY BEFORE breakfast 90 tablet 3  . metoprolol succinate (TOPROL-XL) 25 MG 24 hr tablet TAKE 1 TABLET BY MOUTH EVERY DAY 90 tablet 3  . rosuvastatin (CRESTOR) 40 MG tablet TAKE 1 TABLET BY MOUTH AT BEDTIME 90 tablet 3   No current facility-administered medications for this visit.     Allergies:   Cozaar [losartan]; Pletaal [cilostazol]; and Scallops [employer]  allergy]    ROS:  Please see the history of present illness.   Otherwise, review of systems are positive for abdominal pain, decreased eating, weight loss.   All other systems are reviewed and negative.    PHYSICAL EXAM: VS:  BP 139/62 (BP Location: Left Arm, Patient Position: Sitting, Cuff Size: Normal)   Pulse 71   Ht 5\' 2"  (1.575 m)   Wt 101 lb (45.8 kg)   BMI 18.47 kg/m  , BMI Body mass index is 18.47 kg/m.  GENERAL:  Frail appearing NECK:  No jugular venous distention, waveform within normal limits, carotid upstroke brisk and symmetric, positive bruits, no thyromegaly LUNGS:  Clear to  auscultation bilaterally CHEST:  Unremarkable HEART:  PMI not displaced or sustained,S1 and S2 within normal limits, no S3, no S4, no clicks, no rubs, soft diastolic intermittent murmur at the lower left sternal border murmurs ABD:  Flat, positive bowel sounds normal in frequency in pitch, positive abdominal bruits, no rebound, no guarding, no midline pulsatile mass, no hepatomegaly, no splenomegaly EXT:  2 plus pulses throughout, no edema, no cyanosis no clubbing, high pitch left femoral  EKG:  EKG is  ordered today. Sinus rhythm, rate 71, axis within normal limits, intervals within normal limits, no acute ST-T wave changes.  Recent Labs: No results found for requested labs within last 8760 hours.    Lipid Panel    Component Value Date/Time   CHOL 162 09/01/2016 1226   TRIG 75 09/01/2016 1226   HDL 82 09/01/2016 1226   CHOLHDL 2.0 09/01/2016 1226   VLDL 15 09/01/2016 1226   LDLCALC 65 09/01/2016 1226      Wt Readings from Last 3 Encounters:  06/10/18 101 lb (45.8 kg)  06/04/17 111 lb 6.4 oz (50.5 kg)  03/06/17 113 lb (51.3 kg)      Other studies Reviewed: Additional studies/ records that were reviewed today include: CT of the abdomen Review of the above records demonstrates:  See elsewhere   ASSESSMENT AND PLAN:  CAD:  The patient has no new sypmtoms.  No further cardiovascular testing is indicated.  We will continue with aggressive risk reduction and meds as listed.  PVD:   She does have reduced ABIs.  She saw Dr. Alvester Chou in the past but could not tolerate Pletal.  We are managing her conservatively with risk reduction.  DYSLIPIDEMIA: I have ordered a lipid profile liver enzymes.  HTN:  The blood pressure is at target.  No change in therapy.  HYPOTHYROIDISM:    I will check a TSH  AI:   This was moderate on recent echo.  No change in therapy.  AAA:   This was 3 cm last year.  She will have follow-up tomorrow.  ABDOMINAL COMPLAINTS: I reviewed the results of the CT  as described and I reviewed this with her and encouraged that she comply with follow-up imaging and GI follow-up.   Current medicines are reviewed at length with the patient today.  The patient does not have concerns regarding medicines.  The following changes have been made:  None  Labs/ tests ordered today include:    Orders Placed This Encounter  Procedures  . CBC  . Comprehensive Metabolic Panel (CMET)  . TSH  . Lipid panel  . EKG 12-Lead     Disposition:   FU with me in six months.   Signed, Minus Breeding, MD  06/10/2018 10:56 AM    [facility]

## 2018-06-10 ENCOUNTER — Ambulatory Visit (INDEPENDENT_AMBULATORY_CARE_PROVIDER_SITE_OTHER): Payer: Medicare Other | Admitting: Cardiology

## 2018-06-10 ENCOUNTER — Encounter: Payer: Self-pay | Admitting: Cardiology

## 2018-06-10 VITALS — BP 139/62 | HR 71 | Ht 62.0 in | Wt 101.0 lb

## 2018-06-10 DIAGNOSIS — I2510 Atherosclerotic heart disease of native coronary artery without angina pectoris: Secondary | ICD-10-CM

## 2018-06-10 DIAGNOSIS — I2583 Coronary atherosclerosis due to lipid rich plaque: Secondary | ICD-10-CM | POA: Diagnosis not present

## 2018-06-10 DIAGNOSIS — Z79899 Other long term (current) drug therapy: Secondary | ICD-10-CM | POA: Diagnosis not present

## 2018-06-10 DIAGNOSIS — I739 Peripheral vascular disease, unspecified: Secondary | ICD-10-CM | POA: Insufficient documentation

## 2018-06-10 DIAGNOSIS — E7800 Pure hypercholesterolemia, unspecified: Secondary | ICD-10-CM | POA: Diagnosis not present

## 2018-06-10 DIAGNOSIS — I714 Abdominal aortic aneurysm, without rupture, unspecified: Secondary | ICD-10-CM | POA: Insufficient documentation

## 2018-06-10 DIAGNOSIS — R5383 Other fatigue: Secondary | ICD-10-CM | POA: Diagnosis not present

## 2018-06-10 DIAGNOSIS — R1031 Right lower quadrant pain: Secondary | ICD-10-CM

## 2018-06-10 DIAGNOSIS — I351 Nonrheumatic aortic (valve) insufficiency: Secondary | ICD-10-CM

## 2018-06-10 NOTE — Patient Instructions (Signed)
Medication Instructions:  Continue current medications  If you need a refill on your cardiac medications before your next appointment, please call your pharmacy.  Labwork: Fasting Lipids, CBC, TSH, CMP HERE IN OUR OFFICE AT [facility]  Take the provided lab slips with you to the lab for your blood draw.   You will need to fast. DO NOT EAT OR DRINK PAST MIDNIGHT.    If you have labs (blood work) drawn today and your tests are completely normal, you will receive your results only by: [name] MyChart Message (if you have MyChart) OR . A paper copy in the mail If you have any lab test that is abnormal or we need to change your treatment, we will call you to review the results.  Testing/Procedures: None Ordered  Follow-Up: You will need a follow up appointment in 6 Months.  Please call our office 2 months in [phone]) to schedule the appointment.  You may see  DR Percival Spanish or one of the following Advanced Practice Providers on your designated Care Team:   . Rosaria Ferries, PA-C . Jory Sims, DNP, ANP  At [facility], you and your health needs are our priority.  As part of our continuing mission to provide you with exceptional heart care, we have created designated Provider Care Teams.  These Care Teams include your primary Cardiologist (physician) and Advanced Practice Providers (APPs -  Physician Assistants and Nurse Practitioners) who all work together to provide you with the care you need, when you need it.   Thank you for choosing [facility] at [facility]!!

## 2018-06-11 ENCOUNTER — Ambulatory Visit (HOSPITAL_COMMUNITY)
Admission: RE | Admit: 2018-06-11 | Discharge: 2018-06-11 | Disposition: A | Discharge status: Home / Self Care | Payer: Medicare Other | Source: Ambulatory Visit | Attending: Cardiovascular Disease | Admitting: Cardiovascular Disease

## 2018-06-11 ENCOUNTER — Ambulatory Visit (HOSPITAL_BASED_OUTPATIENT_CLINIC_OR_DEPARTMENT_OTHER)
Admission: RE | Admit: 2018-06-11 | Discharge: 2018-06-11 | Disposition: A | Discharge status: Home / Self Care | Payer: Medicare Other | Source: Ambulatory Visit | Attending: Cardiovascular Disease | Admitting: Cardiovascular Disease

## 2018-06-11 DIAGNOSIS — I739 Peripheral vascular disease, unspecified: Secondary | ICD-10-CM

## 2018-06-11 DIAGNOSIS — I714 Abdominal aortic aneurysm, without rupture, unspecified: Secondary | ICD-10-CM

## 2018-06-12 ENCOUNTER — Other Ambulatory Visit: Payer: Self-pay | Admitting: *Deleted

## 2018-06-12 DIAGNOSIS — I739 Peripheral vascular disease, unspecified: Secondary | ICD-10-CM

## 2018-06-12 LAB — CBC
Hematocrit: 42.6 % (ref 34.0–46.6)
Hemoglobin: 13.5 g/dL (ref 11.1–15.9)
MCH: 28.4 pg (ref 26.6–33.0)
MCHC: 31.7 g/dL (ref 31.5–35.7)
MCV: 90 fL (ref 79–97)
PLATELETS: 153 10*3/uL (ref 150–450)
RBC: 4.76 x10E6/uL (ref 3.77–5.28)
RDW: 12.8 % (ref 12.3–15.4)
WBC: 5.8 10*3/uL (ref 3.4–10.8)

## 2018-06-12 LAB — LIPID PANEL
CHOL/HDL RATIO: 2.1 ratio (ref 0.0–4.4)
Cholesterol, Total: 185 mg/dL (ref 100–199)
HDL: 88 mg/dL (ref >39)
LDL Calculated: 82 mg/dL (ref 0–99)
Triglycerides: 77 mg/dL (ref 0–149)
VLDL Cholesterol Cal: 15 mg/dL (ref 5–40)

## 2018-06-12 LAB — COMPREHENSIVE METABOLIC PANEL
ALT: 17 IU/L (ref 0–32)
AST: 29 IU/L (ref 0–40)
Albumin/Globulin Ratio: 1.8 (ref 1.2–2.2)
Albumin: 4.4 g/dL (ref 3.5–4.7)
Alkaline Phosphatase: 83 IU/L (ref 39–117)
BILIRUBIN TOTAL: 0.4 mg/dL (ref 0.0–1.2)
BUN/Creatinine Ratio: 15 (ref 12–28)
BUN: 13 mg/dL (ref 8–27)
CHLORIDE: 102 mmol/L (ref 96–106)
CO2: 23 mmol/L (ref 20–29)
CREATININE: 0.88 mg/dL (ref 0.57–1.00)
Calcium: 9.9 mg/dL (ref 8.7–10.3)
GFR calc non Af Amer: 61 mL/min/{1.73_m2} (ref >59)
GFR, EST AFRICAN AMERICAN: 70 mL/min/{1.73_m2} (ref >59)
GLUCOSE: 87 mg/dL (ref 65–99)
Globulin, Total: 2.5 g/dL (ref 1.5–4.5)
Potassium: 3.8 mmol/L (ref 3.5–5.2)
Sodium: 144 mmol/L (ref 134–144)
TOTAL PROTEIN: 6.9 g/dL (ref 6.0–8.5)

## 2018-06-12 LAB — TSH: TSH: 0.024 u[IU]/mL — ABNORMAL LOW (ref 0.450–4.500)

## 2018-06-17 ENCOUNTER — Telehealth (HOSPITAL_COMMUNITY): Payer: Self-pay | Admitting: Cardiology

## 2018-06-17 NOTE — Telephone Encounter (Signed)
Patient was informed of AAA results. Awaiting for MD to review labs.

## 2018-06-17 NOTE — Telephone Encounter (Signed)
New message ° ° °Patient is returning call for test results. °

## 2018-06-21 NOTE — Telephone Encounter (Signed)
Please call patient with lab results

## 2018-07-17 ENCOUNTER — Other Ambulatory Visit: Payer: Self-pay | Admitting: Cardiology

## 2018-11-04 ENCOUNTER — Other Ambulatory Visit: Payer: Self-pay | Admitting: Surgery

## 2018-11-04 ENCOUNTER — Ambulatory Visit: Payer: Self-pay | Admitting: Surgery

## 2018-11-04 DIAGNOSIS — K828 Other specified diseases of gallbladder: Secondary | ICD-10-CM

## 2018-11-04 NOTE — H&P (Signed)
History of Present Illness Jasmin Crane. Mynor Witkop MD; 11/04/2018 12:33 PM) The patient is a 85 year old female who presents with an inguinal hernia. Referred by Dr. Carol Ada for right inguinal hernia PCP Dr. Horald Pollen  This is an 85 year old female who presents with a 5 year history of an intermittent bulge in her right groin. Over the last several weeks is has become larger and more painful. It remains reducible. She did have a CT scan last year that did not show an obvious hernia but she was of course supine. The patient has a lot of issues with chronic constipation. Her CT scan last year also mentioned a mass on her gallbladder but this was never followed up with ultrasound or MRI. She denies any gallbladder symptoms. The patient lives independently and is still fairly active. She does still drive her car.  CLINICAL DATA: Right lower quadrant abdominal pain bulging, no injury  EXAM: CT ABDOMEN AND PELVIS WITH CONTRAST  TECHNIQUE: Multidetector CT imaging of the abdomen and pelvis was performed using the standard protocol following bolus administration of intravenous contrast.  CONTRAST: 158mL ISOVUE-300 IOPAMIDOL (ISOVUE-300) INJECTION 61%  COMPARISON: None.  Creatinine was obtained on site at [facility] [facility] at [address]  Results: Creatinine 0.8 mg/dL. GFR of 68  FINDINGS: Lower chest: Mild scarring is noted anteromedially within the right middle lobe. However no active process is seen within the lung bases. Cardiomegaly is present but no pericardial effusion is seen.  Hepatobiliary: The liver enhances and only a single small cyst is noted inferiorly in the right lobe of liver laterally. No calcified gallstones are seen. However, at the fundus of the gallbladder there is a rounded soft tissue structure present of approximately 11 mm in diameter. This could be associated with the fundus of the gallbladder and could possibly represent gallbladder  carcinoma. Ultrasound of the right upper quadrant is recommended to assess further versus MRI for more sensitive assessment.  Pancreas: The pancreas is normal in size in the pancreatic duct is not dilated.  Spleen: The spleen is unremarkable.  Adrenals/Urinary Tract: The adrenal glands appear normal. The kidneys enhance and there are small right renal cyst present. However no calculi are seen and there is no evidence of solid renal mass. Ureters appear normal in caliber. The urinary bladder is not well distended but no abnormality is seen.  Stomach/Bowel: The stomach is moderately distended oral contrast. A small polypoid defect in the fundus of the stomach could represent infolding of the gastric mucosa versus a gastric lesion such as a polyp. Unfortunately a few small bowel loops are clumped together in the left lower quadrant making assessment difficult. The mucosa appears somewhat thickened on a few loops of small bowel in the left lower quadrant and edema or infiltration of small bowel wall focally cannot be excluded. If concerned clinically for a small-bowel lesion then either small-bowel follow-through or perhaps MR of the abdomen would be helpful. There are multiple rectosigmoid colon diverticula noted but no diverticulitis is seen. There is a moderate amount of feces throughout the colon. The terminal ileum is unremarkable and by history the appendix has previously been resected.  Vascular/Lymphatic: There is moderate abdominal aortic atherosclerosis present. There is focal bulging of the mid and distal abdominal aorta to maximum diameter of 2.7 cm. Ectatic abdominal aorta at risk for aneurysm development. Recommend followup by ultrasound in 5 years. This recommendation follows ACR consensus guidelines: White Paper of the ACR Incidental Findings Committee II on Vascular  Findings. J Am Coll Radiol 2013; 10:789-794.  Reproductive: The uterus is normal in size. No adnexal  lesion is seen. No free fluid is noted within the pelvis.  Other: None. No abdominal wall hernia is seen.  Musculoskeletal: The lumbar vertebrae are in normal alignment with degenerative disc disease most marked at L4-5 and L5-S1 levels. Degenerative changes are noted in the hips right greater than left.  IMPRESSION: 1. Rounded soft tissue structure of 1.1 cm near the fundus of the gallbladder of uncertain significance. Consider ultrasound of the gallbladder or possibly MRI for further assessment. No gallstones are seen. 2. Slight prominence of mucosa of a loop of small bowel in the left abdomen of uncertain significance, possibly simply due to lack of distension, but edema or infiltration cannot be excluded. 3. Probable infolding of mucosa within the fundus of the stomach versus polypoid gastric lesion. 4. Dilatation of the distal abdominal aorta with significant abdominal aortic atherosclerosis. Ectatic abdominal aorta at risk for aneurysm development. Recommend follow up by [place] in 5 years. This recommendation follows ACR consensus guidelines: White Paper of the ACR Incidental Findings Committee II on Vascular Findings. J Am Coll Radiol 2013; 10:789-794.   Electronically Signed By: Ivar Drape M.D. On: 01/01/2018 13:46    Past Surgical History Sabino Gasser, CMA; 11/04/2018 10:19 AM) Appendectomy Carotid Artery Surgery Left. Cataract Surgery Bilateral. Colon Polyp Removal - Colonoscopy  Allergies Sabino Gasser, CMA; 11/04/2018 10:19 AM) No Known Drug Allergies [11/04/2018]: Allergies Reconciled  Medication History Sabino Gasser, CMA; 11/04/2018 10:21 AM) amLODIPine Besylate (2.5MG  Tablet, Oral) Active. Levothyroxine Sodium (75MCG Tablet, Oral) Active. Metoprolol Succinate ER (25MG  Tablet ER 24HR, Oral) Active. Rosuvastatin Calcium (40MG  Tablet, Oral) Active. Medications Reconciled  Social History Sabino Gasser, CMA; 11/04/2018 10:19 AM) Alcohol use  Occasional alcohol use. No caffeine use No drug use Tobacco use Former smoker.  Family History Sabino Gasser, [place]; 11/04/2018 10:19 AM) Colon Cancer Brother. Heart Disease Father. Malignant Neoplasm Of Pancreas Mother.  Other Problems Sabino Gasser, CMA; 11/04/2018 10:19 AM) Thyroid Disease     Review of Systems Sabino Gasser CMA; 11/04/2018 10:19 AM) General Present- Weight Loss. Not Present- Appetite Loss, Chills, Fatigue, Fever, Night Sweats and Weight Gain. HEENT Present- Wears glasses/contact lenses. Not Present- Earache, Hearing Loss, Hoarseness, Nose Bleed, Oral Ulcers, Ringing in the Ears, Seasonal Allergies, Sinus Pain, Sore Throat, Visual Disturbances and Yellow Eyes. Respiratory Not Present- Bloody sputum, Chronic Cough, Difficulty Breathing, Snoring and Wheezing. Breast Not Present- Breast Mass, Breast Pain, Nipple Discharge and Skin Changes. Cardiovascular Present- Leg Cramps. Not Present- Chest Pain, Difficulty Breathing Lying Down, Palpitations, Rapid Heart Rate, Shortness of Breath and Swelling of Extremities. Gastrointestinal Present- Constipation. Not Present- Abdominal Pain, Bloating, Bloody Stool, Change in Bowel Habits, Chronic diarrhea, Difficulty Swallowing, Excessive gas, Gets full quickly at meals, Hemorrhoids, Indigestion, Nausea, Rectal Pain and Vomiting. Female Genitourinary Not Present- Frequency, Nocturia, Painful Urination, Pelvic Pain and Urgency. Musculoskeletal Not Present- Back Pain, Joint Pain, Joint Stiffness, Muscle Pain, Muscle Weakness and Swelling of Extremities. Neurological Not Present- Decreased Memory, Fainting, Headaches, Numbness, Seizures, Tingling, Tremor, Trouble walking and Weakness. Psychiatric Not Present- Anxiety, Bipolar, Change in Sleep Pattern, Depression, Fearful and Frequent crying. Endocrine Not Present- Cold Intolerance, Excessive Hunger, Hair Changes, Heat Intolerance, Hot flashes and New Diabetes. Hematology Not Present-  Blood Thinners, Easy Bruising, Excessive bleeding, Gland problems, HIV and Persistent Infections.  Vitals Sabino Gasser CMA; 11/04/2018 10:21 AM) 11/04/2018 10:21 AM Weight: 99.13 lb Height: 62in Body Surface Area: 1.42 m Body Mass Index: 18.13  kg/m  Temp.: 97.80F(Oral)  Pulse: 79 (Regular)  P.OX: 98% (Room air) BP: 110/62 (Sitting, Left Arm, Standard)      Physical Exam Rodman Key K. Kareena Arrambide MD; 11/04/2018 12:34 PM)  The physical exam findings are as follows: Note:WDWN in NAD Eyes: Pupils equal, round; sclera anicteric HENT: Oral mucosa moist; good dentition Neck: No masses palpated, no thyromegaly Lungs: CTA bilaterally; normal respiratory effort CV: Regular rate and rhythm; no murmurs; extremities well-perfused with no edema Abd: +bowel sounds, soft, non-tender, no palpable organomegaly; Reducible right inguinal hernia - no sign of left inguinal hernia Skin: Warm, dry; no sign of jaundice Psychiatric - alert and oriented x 4; calm mood and affect    Assessment & Plan Rodman Key K. Kensington Duerst MD; 11/04/2018 12:34 PM)  INGUINAL HERNIA OF RIGHT SIDE WITHOUT OBSTRUCTION OR GANGRENE (K40.90)  Current Plans Schedule for Surgery - Right inguinal hernia repair with mesh. The surgical procedure has been discussed with the patient. Potential risks, benefits, alternative treatments, and expected outcomes have been explained. All of the patient's questions at this time have been answered. The likelihood of reaching the patient's treatment goal is good. The patient understand the proposed surgical procedure and wishes to proceed.   Cardiac clearance first   GALLBLADDER MASS (K82.8)  Current Plans ABDOMEN, COMPLETE (19417) Note:Abd u/s to thoroughly evaluate GB mass. May need cholecystectomy to rule out GB cancer  Jasmin Crane. Georgette Dover, MD, [facility]  General/ Trauma Surgery Beeper [phone]  11/04/2018 12:34 PM

## 2018-11-05 ENCOUNTER — Telehealth: Payer: Self-pay

## 2018-11-05 NOTE — Telephone Encounter (Signed)
   [facility] Pre-operative Risk Assessment    Request for surgical clearance:  1. What type of surgery is being performed? Right Inguinal Hernia Repair  2. When is this surgery scheduled? TBD  3. What type of clearance is required (medical clearance vs. Pharmacy clearance to hold med vs. Both)? Both  4. Are there any medications that need to be held prior to surgery and how long? Aspirin  5. Practice name and name of physician performing surgery? [facility]  Dr.Tsuei  6. What is your office phone number [phone]   7.   What is your office fax number [phone]  8.   Anesthesia type General   Kathyrn Lass 11/05/2018, 8:26 AM  _________________________________________________________________   (provider comments below)

## 2018-11-07 NOTE — Telephone Encounter (Signed)
New Message ° ° ° °Patient returning phone call  °

## 2018-11-07 NOTE — Telephone Encounter (Signed)
Pt prefers to wait for clearance until she is seen by Dr. Percival Spanish. 12/12/18

## 2018-11-12 ENCOUNTER — Ambulatory Visit
Admission: RE | Admit: 2018-11-12 | Discharge: 2018-11-12 | Disposition: A | Discharge status: Home / Self Care | Payer: Medicare Other | Source: Ambulatory Visit | Attending: Surgery | Admitting: Surgery

## 2018-11-12 DIAGNOSIS — K828 Other specified diseases of gallbladder: Secondary | ICD-10-CM

## 2018-11-28 ENCOUNTER — Telehealth: Payer: Self-pay

## 2018-11-28 ENCOUNTER — Encounter: Payer: Self-pay | Admitting: Cardiology

## 2018-11-28 NOTE — Telephone Encounter (Signed)
Virtual Visit Pre-Appointment Phone Call  Steps For Call:  1. Confirm consent - "In the setting of the current Covid19 crisis, you are scheduled for a (phone or video) visit with your provider on (date) at (time).  Just as we do with many in-office visits, in order for you to participate in this visit, we must obtain consent.  If you'd like, I can send this to your mychart (if signed up) or email for you to review.  Otherwise, I can obtain your verbal consent now.  All virtual visits are billed to your insurance company just like a normal visit would be.  By agreeing to a virtual visit, we'd like you to understand that the technology does not allow for your provider to perform an examination, and thus may limit your provider's ability to fully assess your condition.  Finally, though the technology is pretty good, we cannot assure that it will always work on either your or our end, and in the setting of a video visit, we may have to convert it to a phone-only visit.  In either situation, we cannot ensure that we have a secure connection.  Are you willing to proceed?"  2. Give patient instructions for WebEx download to smartphone as below if video visit  3. Advise patient to be prepared with any vital sign or heart rhythm information, their current medicines, and a piece of paper and pen handy for any instructions they may receive the day of their visit  4. Inform patient they will receive a phone call 15 minutes prior to their appointment time (may be from unknown caller ID) so they should be prepared to answer  5. Confirm that appointment type is correct in Epic appointment notes (video vs telephone)    TELEPHONE CALL NOTE  Jasmin Crane has been deemed a candidate for a follow-up tele-health visit to limit community exposure during the Covid-19 pandemic. I spoke with the patient via phone to ensure availability of phone/video source, confirm preferred email & phone number, and discuss  instructions and expectations.  I reminded Jasmin Crane to be prepared with any vital sign and/or heart rhythm information that could potentially be obtained via home monitoring, at the time of her visit. I reminded Jasmin Crane to expect a phone call at the time of her visit if her visit.  Did the patient verbally acknowledge consent to treatment? Patient provided verbal consent.  Jasmin Crane, [place] 11/28/2018 11:11 AM   DOWNLOADING THE WEBEX SOFTWARE TO SMARTPHONE  - If Apple, go to [employer] and type in WebEx in the search bar. [place] [employer], the blue/green circle. The app is free but as with any other app downloads, their phone may require them to verify saved payment information or Apple password. The patient does NOT have to create an account.  - If Android, ask patient to go to [employer] and type in WebEx in the search bar. [place] [employer], the blue/green circle. The app is free but as with any other app downloads, their phone may require them to verify saved payment information or Android password. The patient does NOT have to create an account.   CONSENT FOR TELE-HEALTH VISIT - PLEASE REVIEW  I hereby voluntarily request, consent and authorize [facility] and its employed or contracted physicians, physician assistants, nurse practitioners or other licensed health care professionals (the Practitioner), to provide me with telemedicine health care services (the "Services") as deemed necessary by the treating Practitioner.  I acknowledge and consent to receive the Services by the Practitioner via telemedicine. I understand that the telemedicine visit will involve communicating with the Practitioner through live audiovisual communication technology and the disclosure of certain medical information by electronic transmission. I acknowledge that I have been given the opportunity to request an in-person assessment or other available alternative prior to  the telemedicine visit and am voluntarily participating in the telemedicine visit.  I understand that I have the right to withhold or withdraw my consent to the use of telemedicine in the course of my care at any time, without affecting my right to future care or treatment, and that the Practitioner or I may terminate the telemedicine visit at any time. I understand that I have the right to inspect all information obtained and/or recorded in the course of the telemedicine visit and may receive copies of available information for a reasonable fee.  I understand that some of the potential risks of receiving the Services via telemedicine include:  [name] Delay or interruption in medical evaluation due to technological equipment failure or disruption; . Information transmitted may not be sufficient (e.g. poor resolution of images) to allow for appropriate medical decision making by the Practitioner; and/or  . In rare instances, security protocols could fail, causing a breach of personal health information.  Furthermore, I acknowledge that it is my responsibility to provide information about my medical history, conditions and care that is complete and accurate to the best of my ability. I acknowledge that Practitioner's advice, recommendations, and/or decision may be based on factors not within their control, such as incomplete or inaccurate data provided by me or distortions of diagnostic images or specimens that may result from electronic transmissions. I understand that the practice of medicine is not an exact science and that Practitioner makes no warranties or guarantees regarding treatment outcomes. I acknowledge that I will receive a copy of this consent concurrently upon execution via email to the email address I last provided but may also request a printed copy by calling the office of [facility].    I understand that my insurance will be billed for this visit.   I have read or had this consent read to  me. . I understand the contents of this consent, which adequately explains the benefits and risks of the Services being provided via telemedicine.  . I have been provided ample opportunity to ask questions regarding this consent and the Services and have had my questions answered to my satisfaction. . I give my informed consent for the services to be provided through the use of telemedicine in my medical care  By participating in this telemedicine visit I agree to the above.

## 2018-11-29 ENCOUNTER — Other Ambulatory Visit: Payer: Self-pay | Admitting: *Deleted

## 2018-11-29 ENCOUNTER — Telehealth (INDEPENDENT_AMBULATORY_CARE_PROVIDER_SITE_OTHER): Payer: Medicare Other | Admitting: Cardiology

## 2018-11-29 ENCOUNTER — Encounter: Payer: Self-pay | Admitting: Cardiology

## 2018-11-29 VITALS — Ht 62.0 in | Wt 99.0 lb

## 2018-11-29 DIAGNOSIS — I739 Peripheral vascular disease, unspecified: Secondary | ICD-10-CM | POA: Diagnosis not present

## 2018-11-29 DIAGNOSIS — I2510 Atherosclerotic heart disease of native coronary artery without angina pectoris: Secondary | ICD-10-CM

## 2018-11-29 DIAGNOSIS — Z01810 Encounter for preprocedural cardiovascular examination: Secondary | ICD-10-CM

## 2018-11-29 NOTE — Progress Notes (Signed)
Virtual Visit via Telephone Note    Evaluation Performed:  Follow-up visit  This visit type was conducted due to national recommendations for restrictions regarding the COVID-19 Pandemic (e.g. social distancing).  This format is felt to be most appropriate for this patient at this time.  All issues noted in this document were discussed and addressed.  No physical exam was performed (except for noted visual exam findings with Video Visits).  Please refer to the patient's chart (MyChart message for video visits and phone note for telephone visits) for the patient's consent to telehealth for [facility].  Date:  11/29/2018   ID:  Jasmin Crane, DOB 10-Dec-1931, MRN [id number]  Patient Location:  Home address  Provider location:   [facility] office  PCP:  Jasmin Rasmussen, MD  Cardiologist:  Jasmin Breeding, MD  Electrophysiologist:  None   Chief Complaint:    Abdominal pain.    History of Present Illness:    Jasmin Crane is a 85 y.o. female who presents via audio/video conferencing for a telehealth visit today.    Jasmin Crane is a 85 y.o. female who presents for evaluation of coronary artery disease. She was living in [place] and moved here with her daughter.  The first diagnosis of coronary disease was apparently in 1998. She had stenting of her right coronary artery that time. She's had a well preserved ejection fraction over the years. Her last echocardiogram in August of last year demonstrated an EF of 60%. She does have some aortic sclerosis with moderate aortic insufficiency.  She does have carotid stenosis with right 80% stenosis and left 15-49% stenosis. She has some moderately had normal ABIs left greater than right. Does have some symptoms of claudication. She's never had any neurologic symptoms.  She had a stress perfusion study in August 2017 that demonstrated moderate to high suspicion for ischemia. However, this was followed up with coronary CTA. She had a long calcification  of the left main and proximal LAD but the severity of this could not be quantified secondary to motion artifact. There was moderate stenosis of the left circumflex. The right coronary artery could not be assessed secondary to artifact. It was decided however manage her medically.   If she had to undergo carotid endarterectomy she would likely need cardiac catheterization.  Echo demonstrated a well-preserved ejection fraction with moderate aortic insufficiency.  She was having a lot of abdominal discomfort previously and was seen by GI.  She is found to have an inguinal hernia and is going to have this electively repaired.  She actually has been feeling very well from a cardiovascular standpoint.  She said she just walked up a big hill and she did fine. The patient denies any new symptoms such as chest discomfort, neck or arm discomfort. There has been no new shortness of breath, PND or orthopnea. There have been no reported palpitations, presyncope or syncope.  The patient does not have symptoms concerning for COVID-19 infection (fever, chills, cough, or new shortness of breath).    Prior CV studies:   The following studies were reviewed today:  Labs.    Past Medical History:  Diagnosis Date  . Arrhythmia   . Carotid artery occlusion   . Claudication of lower extremity ([place])   . Coronary artery disease   . Hyperlipidemia   . Hypertension   . Myopathy   . Paralysis of tongue   . Sick sinus syndrome [facility])    Past Surgical History:  Procedure Laterality Date  .  APPENDECTOMY    . CAROTID ENDARTERECTOMY    . CATARACT EXTRACTION, BILATERAL    . CESAREAN SECTION    . CORONARY ANGIOPLASTY    . SALPINGECTOMY       Current Meds  Medication Sig  . amLODipine (NORVASC) 2.5 MG tablet Take 2.5 mg by mouth daily.  [name] aspirin 325 MG tablet Take 325 mg by mouth daily.  [name] levothyroxine (SYNTHROID, LEVOTHROID) 75 MCG tablet TAKE 1 TABLET BY MOUTH EVERY DAY in THE morning ON an empty stomach  .  metoprolol succinate (TOPROL-XL) 25 MG 24 hr tablet TAKE 1 TABLET BY MOUTH EVERY DAY  . rosuvastatin (CRESTOR) 40 MG tablet TAKE 1 TABLET BY MOUTH AT BEDTIME  . [DISCONTINUED] levothyroxine (SYNTHROID, LEVOTHROID) 100 MCG tablet TAKE 1 TABLET BY MOUTH EVERY DAY BEFORE BREAKFAST     Allergies:   Cozaar [losartan]; Pletaal [cilostazol]; and Scallops [shellfish allergy]   Social History   Tobacco Use  . Smoking status: Former Research scientist (life sciences)  . Smokeless tobacco: Never Used  . Tobacco comment: Quit tobacco 25 years ago  Substance Use Topics  . Alcohol use: Never    Frequency: Never  . Drug use: Never     Family Hx: The patient's family history includes Arthritis in her brother; Cancer in her mother; Heart attack (age of onset: 11) in her son; Heart disease (age of onset: 36) in her father; Heart failure in her sister; Lung cancer in her brother; Pancreatic cancer in her brother; Stroke in her mother.  ROS:   Please see the history of present illness.    Positive for constipation All other systems reviewed and are negative.   Labs/Other Tests and Data Reviewed:    Recent Labs: 06/11/2018: ALT 17; BUN 13; Creatinine, Ser 0.88; Hemoglobin 13.5; Platelets 153; Potassium 3.8; Sodium 144; TSH 0.024   Recent Lipid Panel Lab Results  Component Value Date/Time   CHOL 185 06/11/2018 10:26 AM   TRIG 77 06/11/2018 10:26 AM   HDL 88 06/11/2018 10:26 AM   CHOLHDL 2.1 06/11/2018 10:26 AM   CHOLHDL 2.0 09/01/2016 12:26 PM   LDLCALC 82 06/11/2018 10:26 AM    Wt Readings from Last 3 Encounters:  11/29/18 99 lb (44.9 kg)  06/10/18 101 lb (45.8 kg)  06/04/17 111 lb 6.4 oz (50.5 kg)     Objective:    Vital Signs:  Ht 5\' 2"  (1.575 m)   Wt 99 lb (44.9 kg)   BMI 18.11 kg/m     ASSESSMENT & PLAN:    CAD:  The patient has no new sypmtoms.  No further cardiovascular testing is indicated.  We will continue with aggressive risk reduction and meds as listed.  PREOP: At this point the patient  would be out of acceptable risk for the planned surgery.  However, she really does not want to have this and I would agree that this would be deferred until the COVID crisis is well cleared.  I do not think she need any further cardiovascular testing although I would probably want to revisit this question if her elective surgery is more than 6 months out.  PVD:    She is not having any claudication.  No change in therapy.  DYSLIPIDEMIA:    She had a good lipid profile in October.  No change in therapy.  I reviewed these labs today for this visit.  HTN:  She says that this is at target.  No change in therapy.  HYPOTHYROIDISM:   Her TSH was slow but  she says that Jasmin Rasmussen, MD has changed her meds to adjust for this.    AI:   This was moderate on echo in August.  I will follow-up with an echo probably in a year.  AAA:   This was 2.8 cm last year.  No change in follow up.    ABDOMINAL COMPLAINTS: I reviewed the results of the CT as described and I reviewed this with her and encouraged that she comply with follow-up imaging and GI follow-up.    COVID-19 Education: The signs and symptoms of COVID-19 were discussed with the patient and how to seek care for testing (follow up with PCP or arrange E-visit).  The importance of social distancing was discussed today.  Patient Risk:   After full review of this patient's clinical status, I feel that they are at least moderate risk at this time.  Time:   Today, I have spent 25 minutes with the patient with telehealth technology discussing the above.     Medication Adjustments/Labs and Tests Ordered: Current medicines are reviewed at length with the patient today.  Concerns regarding medicines are outlined above.  Tests Ordered: No orders of the defined types were placed in this encounter.  Medication Changes: No orders of the defined types were placed in this encounter.   Disposition:  Follow up in 6 month(s)  Signed, Jasmin Breeding, MD  11/29/2018 4:06 PM    [facility]

## 2018-11-29 NOTE — Patient Instructions (Signed)
Medication Instructions:  Continue current medications   If you need a refill on your cardiac medications before your next appointment, please call your pharmacy.  Labwork: None ordered   Testing/Procedures: None Ordered   Follow-Up: You will need a follow up appointment in 6 months.  Please call our office 2 months in advance to schedule this appointment.  You may see Dr Percival Spanish or one of the following Advanced Practice Providers on your designated Care Team:   Rosaria Ferries, PA-C . Jory Sims, DNP, ANP      At [facility], you and your health needs are our priority.  As part of our continuing mission to provide you with exceptional heart care, we have created designated Provider Care Teams.  These Care Teams include your primary Cardiologist (physician) and Advanced Practice Providers (APPs -  Physician Assistants and Nurse Practitioners) who all work together to provide you with the care you need, when you need it.  Thank you for choosing [facility] at [facility]!!

## 2018-12-12 ENCOUNTER — Ambulatory Visit: Payer: Medicare Other | Admitting: Cardiology

## 2019-02-14 ENCOUNTER — Ambulatory Visit: Payer: Self-pay | Admitting: Surgery

## 2019-02-21 ENCOUNTER — Encounter (HOSPITAL_COMMUNITY)
Admission: RE | Admit: 2019-02-21 | Discharge: 2019-02-21 | Disposition: A | Discharge status: Home / Self Care | Payer: Medicare Other | Source: Ambulatory Visit | Attending: Surgery | Admitting: Surgery

## 2019-02-21 ENCOUNTER — Encounter (HOSPITAL_COMMUNITY): Payer: Self-pay

## 2019-02-21 ENCOUNTER — Other Ambulatory Visit: Payer: Self-pay

## 2019-02-21 ENCOUNTER — Other Ambulatory Visit (HOSPITAL_COMMUNITY)
Admission: RE | Admit: 2019-02-21 | Discharge: 2019-02-21 | Disposition: A | Discharge status: Home / Self Care | Payer: Medicare Other | Source: Ambulatory Visit | Attending: Surgery | Admitting: Surgery

## 2019-02-21 DIAGNOSIS — E039 Hypothyroidism, unspecified: Secondary | ICD-10-CM | POA: Diagnosis not present

## 2019-02-21 DIAGNOSIS — K4090 Unilateral inguinal hernia, without obstruction or gangrene, not specified as recurrent: Secondary | ICD-10-CM | POA: Insufficient documentation

## 2019-02-21 DIAGNOSIS — Z79899 Other long term (current) drug therapy: Secondary | ICD-10-CM | POA: Diagnosis not present

## 2019-02-21 DIAGNOSIS — I2510 Atherosclerotic heart disease of native coronary artery without angina pectoris: Secondary | ICD-10-CM | POA: Diagnosis not present

## 2019-02-21 DIAGNOSIS — Z01812 Encounter for preprocedural laboratory examination: Secondary | ICD-10-CM | POA: Insufficient documentation

## 2019-02-21 DIAGNOSIS — I10 Essential (primary) hypertension: Secondary | ICD-10-CM | POA: Diagnosis not present

## 2019-02-21 DIAGNOSIS — Z79890 Hormone replacement therapy (postmenopausal): Secondary | ICD-10-CM | POA: Diagnosis not present

## 2019-02-21 DIAGNOSIS — Z87891 Personal history of nicotine dependence: Secondary | ICD-10-CM | POA: Diagnosis not present

## 2019-02-21 DIAGNOSIS — Z1159 Encounter for screening for other viral diseases: Secondary | ICD-10-CM | POA: Diagnosis not present

## 2019-02-21 DIAGNOSIS — E785 Hyperlipidemia, unspecified: Secondary | ICD-10-CM | POA: Diagnosis not present

## 2019-02-21 HISTORY — DX: Other complications of anesthesia, initial encounter: T88.59XA

## 2019-02-21 HISTORY — DX: Diverticulitis of intestine, part unspecified, without perforation or abscess without bleeding: K57.92

## 2019-02-21 HISTORY — DX: Malignant (primary) neoplasm, unspecified: C80.1

## 2019-02-21 HISTORY — DX: Hypothyroidism, unspecified: E03.9

## 2019-02-21 LAB — BASIC METABOLIC PANEL
Anion gap: 8 (ref 5–15)
BUN: 15 mg/dL (ref 8–23)
CO2: 25 mmol/L (ref 22–32)
Calcium: 9.8 mg/dL (ref 8.9–10.3)
Chloride: 109 mmol/L (ref 98–111)
Creatinine, Ser: 0.89 mg/dL (ref 0.44–1.00)
GFR calc Af Amer: >60 mL/min (ref >60)
GFR calc non Af Amer: 59 mL/min — ABNORMAL LOW (ref >60)
Glucose, Bld: 95 mg/dL (ref 70–99)
Potassium: 4.5 mmol/L (ref 3.5–5.1)
Sodium: 142 mmol/L (ref 135–145)

## 2019-02-21 LAB — CBC
HCT: 43 % (ref 36.0–46.0)
Hemoglobin: 13.2 g/dL (ref 12.0–15.0)
MCH: 28.3 pg (ref 26.0–34.0)
MCHC: 30.7 g/dL (ref 30.0–36.0)
MCV: 92.3 fL (ref 80.0–100.0)
Platelets: 146 10*3/uL — ABNORMAL LOW (ref 150–400)
RBC: 4.66 MIL/uL (ref 3.87–5.11)
RDW: 14.3 % (ref 11.5–15.5)
WBC: 4.5 10*3/uL (ref 4.0–10.5)
nRBC: 0 % (ref 0.0–0.2)

## 2019-02-21 LAB — SARS CORONAVIRUS 2 (TAT 6-24 HRS): SARS Coronavirus 2: NEGATIVE

## 2019-02-21 NOTE — Pre-Procedure Instructions (Signed)
   Jasmin Crane  02/21/2019     [place] [address] [place] [address] Phone: [phone] Fax: [phone]   Your procedure is scheduled on Tuesday, February 25, 2019  Report to [place] at 9:30 A.M.  Call this number if you have problems the morning of surgery:  [phone]   Remember: Brush your teeth the morning of surgery with your regular toothpaste.  Do not eat  after midnight Monday, February 24, 2019   You may drink clear liquids until 8:30 A.M..( 3 hours prior to surgery).  Clear liquids allowed are:  Water, Juice (non-citric and without pulp), Carbonated beverages, Clear Tea, Black Coffee only, Plain Jell-O only, Gatorade and Plain Popsicles only    Take these medicines the morning of surgery with A SIP OF WATER: amLODipine (NORVASC),  levothyroxine (SYNTHROID, LEVOTHROID),   metoprolol succinate (TOPROL-XL) Stop taking Aspirin (unless otherwise advised by surgeon), vitamins, fish oil and herbal medications. Do not take any NSAIDs ie: Ibuprofen, Advil, Naproxen (Aleve), Motrin, BC and Goody Powder;stop now. Please call surgeon's office, if no pre-op  instructions were provided regarding Aspirin.   Do not wear jewelry, make-up or nail polish.  Do not wear lotions, powders, or perfumes, or deodorant.  Do not shave 48 hours prior to surgery.   Do not bring valuables to the hospital.  [facility] is not responsible for any belongings or valuables.  Contacts, dentures or bridgework may not be worn into surgery.  Patients discharged the day of surgery will not be allowed to drive home.  Special instructions: Shower the night before and the morning of surgery with CHG. Please read over the following fact sheets that you were given. Pain Booklet, Coughing and Deep Breathing and Surgical Site Infection Prevention

## 2019-02-21 NOTE — Progress Notes (Signed)
Pt denies SOB and chest pain. Pt stated that she is under the care of Dr. Percival Spanish, Cardiology and Dr. Horald Pollen, PCP. Pt stated that a stress test was performed >10 years ago. Pt denies having a chest x ray within the last year. Pt denies recent labs.   Pt denies that she and family members tested positive for COVID-19 ( pt scheduled to be tested today and reminded to quarantine).     Pt denies that she ad family members experienced the following symptoms:  Cough yes/no: No Fever (>100.70F)  yes/no: No Runny nose yes/no: No Sore throat yes/no: No Difficulty breathing/shortness of breath  yes/no: No  Have you or a family member traveled in the last 14 days and where? yes/no: No  Pt reminded that hospital visitation restrictions are in effect and the importance of the restrictions.   Pt verbalized understanding of all pre-op instructions.  Pt chart forwarded to PA, Anesthesiology, for review of cardiac history.

## 2019-02-24 NOTE — Anesthesia Preprocedure Evaluation (Addendum)
Anesthesia Evaluation  Patient identified by MRN, date of birth, ID band Patient awake    Reviewed: Allergy & Precautions, NPO status , Patient's Chart, lab work & pertinent test results, reviewed documented beta blocker date and time   Airway Mallampati: III  TM Distance: >3 FB Neck ROM: Full  Mouth opening: Limited Mouth Opening  Dental no notable dental hx. (+) Edentulous Upper, Missing, Dental Advisory Given,    Pulmonary neg pulmonary ROS, former smoker,    Pulmonary exam normal breath sounds clear to auscultation       Cardiovascular hypertension, Pt. on medications and Pt. on home beta blockers + CAD, + Cardiac Stents and + Peripheral Vascular Disease (s/p CEA)  Normal cardiovascular exam+ Valvular Problems/Murmurs AI  Rhythm:Regular Rate:Normal     Neuro/Psych negative neurological ROS  negative psych ROS   GI/Hepatic negative GI ROS, Neg liver ROS,   Endo/Other  Hypothyroidism   Renal/GU negative Renal ROS  negative genitourinary   Musculoskeletal negative musculoskeletal ROS (+)   Abdominal   Peds  Hematology negative hematology ROS (+)   Anesthesia Other Findings Follows with cardiology for CAD s/p RCA stent 1998, Mod AR, Carotid stenosis (60-79% RICA stenosis. 86-76% LICA stenosis, s/p CEA. By echo 01/2017).  TTE 04/04/18: EF 60-65%, mod AI   Reproductive/Obstetrics                          Anesthesia Physical Anesthesia Plan  ASA: III  Anesthesia Plan: General and Regional   Post-op Pain Management:  Regional for Post-op pain   Induction: Intravenous  PONV Risk Score and Plan: 3 and Ondansetron, Dexamethasone and Treatment may vary due to age or medical condition  Airway Management Planned: Oral ETT  Additional Equipment:   Intra-op Plan:   Post-operative Plan: Extubation in OR  Informed Consent: I have reviewed the patients History and Physical, chart, labs and  discussed the procedure including the risks, benefits and alternatives for the proposed anesthesia with the patient or authorized representative who has indicated his/her understanding and acceptance.     Dental advisory given  Plan Discussed with: CRNA  Anesthesia Plan Comments: (  )      Anesthesia Quick Evaluation

## 2019-02-25 ENCOUNTER — Other Ambulatory Visit: Payer: Self-pay

## 2019-02-25 ENCOUNTER — Ambulatory Visit (HOSPITAL_COMMUNITY): Payer: Medicare Other | Admitting: Anesthesiology

## 2019-02-25 ENCOUNTER — Encounter (HOSPITAL_COMMUNITY): Payer: Self-pay

## 2019-02-25 ENCOUNTER — Ambulatory Visit (HOSPITAL_COMMUNITY)
Admission: RE | Admit: 2019-02-25 | Discharge: 2019-02-25 | Disposition: A | Discharge status: Home / Self Care | Payer: Medicare Other | Attending: Surgery | Admitting: Surgery

## 2019-02-25 ENCOUNTER — Ambulatory Visit (HOSPITAL_COMMUNITY): Payer: Medicare Other | Admitting: Physician Assistant

## 2019-02-25 ENCOUNTER — Encounter (HOSPITAL_COMMUNITY)
Admission: RE | Disposition: A | Discharge status: Home / Self Care | Payer: Self-pay | Source: Home / Self Care | Attending: Surgery

## 2019-02-25 DIAGNOSIS — Z79890 Hormone replacement therapy (postmenopausal): Secondary | ICD-10-CM | POA: Diagnosis not present

## 2019-02-25 DIAGNOSIS — I2510 Atherosclerotic heart disease of native coronary artery without angina pectoris: Secondary | ICD-10-CM | POA: Insufficient documentation

## 2019-02-25 DIAGNOSIS — I10 Essential (primary) hypertension: Secondary | ICD-10-CM | POA: Diagnosis not present

## 2019-02-25 DIAGNOSIS — E039 Hypothyroidism, unspecified: Secondary | ICD-10-CM | POA: Insufficient documentation

## 2019-02-25 DIAGNOSIS — Z955 Presence of coronary angioplasty implant and graft: Secondary | ICD-10-CM | POA: Insufficient documentation

## 2019-02-25 DIAGNOSIS — Z87891 Personal history of nicotine dependence: Secondary | ICD-10-CM | POA: Diagnosis not present

## 2019-02-25 DIAGNOSIS — Z79899 Other long term (current) drug therapy: Secondary | ICD-10-CM | POA: Insufficient documentation

## 2019-02-25 DIAGNOSIS — K4090 Unilateral inguinal hernia, without obstruction or gangrene, not specified as recurrent: Secondary | ICD-10-CM | POA: Diagnosis present

## 2019-02-25 DIAGNOSIS — I739 Peripheral vascular disease, unspecified: Secondary | ICD-10-CM | POA: Insufficient documentation

## 2019-02-25 HISTORY — PX: INGUINAL HERNIA REPAIR: SHX194

## 2019-02-25 SURGERY — REPAIR, HERNIA, INGUINAL, ADULT
Anesthesia: Regional | Site: Groin | Laterality: Right

## 2019-02-25 MED ORDER — LIDOCAINE 2% (20 MG/ML) 5 ML SYRINGE
INTRAMUSCULAR | Status: AC
  Filled 2019-02-25: qty 5

## 2019-02-25 MED ORDER — ACETAMINOPHEN 500 MG PO TABS
1000.0000 mg | ORAL_TABLET | ORAL | Status: AC
  Administered 2019-02-25: 06:00:00 1000 mg via ORAL

## 2019-02-25 MED ORDER — FENTANYL CITRATE (PF) 250 MCG/5ML IJ SOLN
INTRAMUSCULAR | Status: AC
  Filled 2019-02-25: qty 5

## 2019-02-25 MED ORDER — BUPIVACAINE-EPINEPHRINE (PF) 0.25% -1:200000 IJ SOLN
INTRAMUSCULAR | Status: AC
  Filled 2019-02-25: qty 30

## 2019-02-25 MED ORDER — BUPIVACAINE-EPINEPHRINE (PF) 0.5% -1:200000 IJ SOLN
INTRAMUSCULAR | Status: DC | PRN
  Administered 2019-02-25: 20 mL via PERINEURAL

## 2019-02-25 MED ORDER — HYDROCODONE-ACETAMINOPHEN 5-325 MG PO TABS: 1.0000 | ORAL_TABLET | Freq: Four times a day (QID) | ORAL | 0 refills | Status: DC | PRN

## 2019-02-25 MED ORDER — FENTANYL CITRATE (PF) 100 MCG/2ML IJ SOLN: 25.0000 ug | INTRAMUSCULAR | Status: DC | PRN

## 2019-02-25 MED ORDER — MIDAZOLAM HCL 2 MG/2ML IJ SOLN
INTRAMUSCULAR | Status: AC
  Filled 2019-02-25: qty 2

## 2019-02-25 MED ORDER — 0.9 % SODIUM CHLORIDE (POUR BTL) OPTIME
TOPICAL | Status: DC | PRN
  Administered 2019-02-25: 1000 mL

## 2019-02-25 MED ORDER — PHENYLEPHRINE 40 MCG/ML (10ML) SYRINGE FOR IV PUSH (FOR BLOOD PRESSURE SUPPORT)
PREFILLED_SYRINGE | INTRAVENOUS | Status: AC
  Filled 2019-02-25: qty 10

## 2019-02-25 MED ORDER — CEFAZOLIN SODIUM-DEXTROSE 2-4 GM/100ML-% IV SOLN
2.0000 g | INTRAVENOUS | Status: AC
  Administered 2019-02-25: 2 g via INTRAVENOUS

## 2019-02-25 MED ORDER — ONDANSETRON HCL 4 MG/2ML IJ SOLN
INTRAMUSCULAR | Status: DC | PRN
  Administered 2019-02-25: 4 mg via INTRAVENOUS

## 2019-02-25 MED ORDER — PROPOFOL 10 MG/ML IV BOLUS
INTRAVENOUS | Status: AC
  Filled 2019-02-25: qty 20

## 2019-02-25 MED ORDER — GABAPENTIN 300 MG PO CAPS
300.0000 mg | ORAL_CAPSULE | ORAL | Status: AC
  Administered 2019-02-25: 06:00:00 300 mg via ORAL

## 2019-02-25 MED ORDER — CLONIDINE HCL (ANALGESIA) 100 MCG/ML EP SOLN
EPIDURAL | Status: DC | PRN
  Administered 2019-02-25: 50 ug

## 2019-02-25 MED ORDER — LIDOCAINE 2% (20 MG/ML) 5 ML SYRINGE
INTRAMUSCULAR | Status: DC | PRN
  Administered 2019-02-25: 40 mg via INTRAVENOUS

## 2019-02-25 MED ORDER — GABAPENTIN 300 MG PO CAPS
ORAL_CAPSULE | ORAL | Status: AC
  Administered 2019-02-25: 300 mg via ORAL
  Filled 2019-02-25: qty 1

## 2019-02-25 MED ORDER — PROPOFOL 10 MG/ML IV BOLUS
INTRAVENOUS | Status: DC | PRN
  Administered 2019-02-25: 50 mg via INTRAVENOUS

## 2019-02-25 MED ORDER — ACETAMINOPHEN 500 MG PO TABS
ORAL_TABLET | ORAL | Status: AC
  Administered 2019-02-25: 1000 mg via ORAL
  Filled 2019-02-25: qty 2

## 2019-02-25 MED ORDER — DEXAMETHASONE SODIUM PHOSPHATE 4 MG/ML IJ SOLN
INTRAMUSCULAR | Status: DC | PRN
  Administered 2019-02-25: 4 mg via INTRAVENOUS

## 2019-02-25 MED ORDER — CHLORHEXIDINE GLUCONATE CLOTH 2 % EX PADS: 6.0000 | MEDICATED_PAD | Freq: Once | CUTANEOUS | Status: DC

## 2019-02-25 MED ORDER — FENTANYL CITRATE (PF) 100 MCG/2ML IJ SOLN
INTRAMUSCULAR | Status: DC | PRN
  Administered 2019-02-25 (×2): 25 ug via INTRAVENOUS

## 2019-02-25 MED ORDER — CEFAZOLIN SODIUM-DEXTROSE 2-4 GM/100ML-% IV SOLN
INTRAVENOUS | Status: AC
  Filled 2019-02-25: qty 100

## 2019-02-25 MED ORDER — BUPIVACAINE-EPINEPHRINE 0.25% -1:200000 IJ SOLN
INTRAMUSCULAR | Status: DC | PRN
  Administered 2019-02-25: 10 mL

## 2019-02-25 MED ORDER — DEXAMETHASONE SODIUM PHOSPHATE 10 MG/ML IJ SOLN
INTRAMUSCULAR | Status: AC
  Filled 2019-02-25: qty 1

## 2019-02-25 MED ORDER — PHENYLEPHRINE 40 MCG/ML (10ML) SYRINGE FOR IV PUSH (FOR BLOOD PRESSURE SUPPORT)
PREFILLED_SYRINGE | INTRAVENOUS | Status: DC | PRN
  Administered 2019-02-25 (×3): 80 ug via INTRAVENOUS

## 2019-02-25 MED ORDER — LACTATED RINGERS IV SOLN
INTRAVENOUS | Status: DC | PRN
  Administered 2019-02-25: 07:00:00 via INTRAVENOUS

## 2019-02-25 MED ORDER — SUCCINYLCHOLINE CHLORIDE 200 MG/10ML IV SOSY
PREFILLED_SYRINGE | INTRAVENOUS | Status: AC
  Filled 2019-02-25: qty 10

## 2019-02-25 MED ORDER — SUCCINYLCHOLINE CHLORIDE 200 MG/10ML IV SOSY
PREFILLED_SYRINGE | INTRAVENOUS | Status: DC | PRN
  Administered 2019-02-25: 100 mg via INTRAVENOUS

## 2019-02-25 MED ORDER — ONDANSETRON HCL 4 MG/2ML IJ SOLN
INTRAMUSCULAR | Status: AC
  Filled 2019-02-25: qty 2

## 2019-02-25 SURGICAL SUPPLY — 45 items
BENZOIN TINCTURE PRP APPL 2/3 (GAUZE/BANDAGES/DRESSINGS) ×2 IMPLANT
CHLORAPREP W/TINT 26 (MISCELLANEOUS) ×2 IMPLANT
CLOSURE STERI-STRIP 1/4X4 (GAUZE/BANDAGES/DRESSINGS) ×1 IMPLANT
COVER SURGICAL LIGHT HANDLE (MISCELLANEOUS) ×2 IMPLANT
COVER WAND RF STERILE (DRAPES) ×2 IMPLANT
DRAIN PENROSE 1/2X12 LTX STRL (WOUND CARE) IMPLANT
DRAPE LAPAROSCOPIC ABDOMINAL (DRAPES) IMPLANT
DRAPE LAPAROTOMY TRNSV 102X78 (DRAPES) IMPLANT
DRSG TEGADERM 4X4.75 (GAUZE/BANDAGES/DRESSINGS) ×2 IMPLANT
ELECT CAUTERY BLADE 6.4 (BLADE) IMPLANT
ELECT REM PT RETURN 9FT ADLT (ELECTROSURGICAL) ×2
ELECTRODE REM PT RTRN 9FT ADLT (ELECTROSURGICAL) ×1 IMPLANT
GAUZE 4X4 16PLY RFD (DISPOSABLE) ×2 IMPLANT
GAUZE SPONGE 4X4 12PLY STRL (GAUZE/BANDAGES/DRESSINGS) ×2 IMPLANT
GLOVE BIO SURGEON STRL SZ7 (GLOVE) ×2 IMPLANT
GLOVE BIOGEL PI IND STRL 7.5 (GLOVE) ×1 IMPLANT
GLOVE BIOGEL PI INDICATOR 7.5 (GLOVE) ×1
GOWN STRL REUS W/ TWL LRG LVL3 (GOWN DISPOSABLE) ×2 IMPLANT
GOWN STRL REUS W/TWL LRG LVL3 (GOWN DISPOSABLE) ×2
KIT BASIN OR (CUSTOM PROCEDURE TRAY) ×2 IMPLANT
KIT TURNOVER KIT B (KITS) ×2 IMPLANT
MESH PARIETEX PROGRIP RIGHT (Mesh General) ×1 IMPLANT
NDL HYPO 25GX1X1/2 BEV (NEEDLE) ×1 IMPLANT
NEEDLE HYPO 25GX1X1/2 BEV (NEEDLE) ×2 IMPLANT
NS IRRIG 1000ML POUR BTL (IV SOLUTION) ×2 IMPLANT
PACK GENERAL/GYN (CUSTOM PROCEDURE TRAY) ×2 IMPLANT
PAD ARMBOARD 7.5X6 YLW CONV (MISCELLANEOUS) ×2 IMPLANT
PENCIL SMOKE EVACUATOR (MISCELLANEOUS) ×2 IMPLANT
SPONGE INTESTINAL PEANUT (DISPOSABLE) ×2 IMPLANT
STRIP CLOSURE SKIN 1/2X4 (GAUZE/BANDAGES/DRESSINGS) ×2 IMPLANT
SUT MNCRL AB 4-0 PS2 18 (SUTURE) ×2 IMPLANT
SUT PDS AB 0 CT 36 (SUTURE) IMPLANT
SUT SILK 2 0 SH (SUTURE) IMPLANT
SUT SILK 3 0 (SUTURE)
SUT SILK 3-0 18XBRD TIE 12 (SUTURE) IMPLANT
SUT VIC AB 0 CT1 27 (SUTURE) ×1
SUT VIC AB 0 CT1 27XBRD ANBCTR (SUTURE) IMPLANT
SUT VIC AB 0 CT2 27 (SUTURE) ×2 IMPLANT
SUT VIC AB 2-0 SH 27 (SUTURE) ×1
SUT VIC AB 2-0 SH 27X BRD (SUTURE) ×1 IMPLANT
SUT VIC AB 3-0 SH 27 (SUTURE) ×1
SUT VIC AB 3-0 SH 27XBRD (SUTURE) ×1 IMPLANT
SYR CONTROL 10ML LL (SYRINGE) ×2 IMPLANT
TOWEL GREEN STERILE FF (TOWEL DISPOSABLE) ×2 IMPLANT
TOWEL OR 17X26 10 PK STRL BLUE (TOWEL DISPOSABLE) ×2 IMPLANT

## 2019-02-25 NOTE — Anesthesia Postprocedure Evaluation (Signed)
Anesthesia Post Note  Patient: Cheyeanne Roadcap  Procedure(s) Performed: RIGHT INGUINAL HERNIA REPAIR WITH MESH (Right Groin)     Patient location during evaluation: PACU Anesthesia Type: Regional and General Level of consciousness: awake and alert Pain management: pain level controlled Vital Signs Assessment: post-procedure vital signs reviewed and stable Respiratory status: spontaneous breathing, nonlabored ventilation, respiratory function stable and patient connected to nasal cannula oxygen Cardiovascular status: blood pressure returned to baseline and stable Postop Assessment: no apparent nausea or vomiting Anesthetic complications: no    Last Vitals:  Vitals:   02/25/19 0915 02/25/19 0930  BP: (!) 146/65 131/60  Pulse: 68   Resp: 18 16  Temp:    SpO2: 93% 94%    Last Pain:  Vitals:   02/25/19 0930  TempSrc:   PainSc: 0-No pain                 Dashanique Brownstein L Jibran Crookshanks

## 2019-02-25 NOTE — Transfer of Care (Signed)
Immediate Anesthesia Transfer of Care Note  Patient: Jasmin Crane  Procedure(s) Performed: RIGHT INGUINAL HERNIA REPAIR WITH MESH (Right Groin)  Patient Location: PACU  Anesthesia Type:General  Level of Consciousness: oriented, drowsy and patient cooperative  Airway & Oxygen Therapy: Patient Spontanous Breathing and Patient connected to nasal cannula oxygen  Post-op Assessment: Report given to RN and Post -op Vital signs reviewed and stable  Post vital signs: Reviewed  Last Vitals:  Vitals Value Taken Time  BP    Temp    Pulse 73 02/25/19 0832  Resp 23 02/25/19 0832  SpO2 95 % 02/25/19 0832  Vitals shown include unvalidated device data.  Last Pain:  Vitals:   02/25/19 0554  TempSrc: Oral  PainSc: 0-No pain         Complications: No apparent anesthesia complications

## 2019-02-25 NOTE — H&P (Signed)
History of Present Illness The patient is a 85 year old female who presents with an inguinal hernia. Referred by Dr. Carol Ada for right inguinal hernia PCP Dr. Horald Pollen  This is an 85 year old female who presents with a 5 year history of an intermittent bulge in her right groin. Over the last several weeks is has become larger and more painful. It remains reducible. She did have a CT scan last year that did not show an obvious hernia but she was of course supine. The patient has a lot of issues with chronic constipation. Her CT scan last year also mentioned a mass on her gallbladder but this was never followed up with ultrasound or MRI. She denies any gallbladder symptoms. The patient lives independently and is still fairly active. She does still drive her car.  CLINICAL DATA: Right lower quadrant abdominal pain bulging, no injury  EXAM: CT ABDOMEN AND PELVIS WITH CONTRAST  TECHNIQUE: Multidetector CT imaging of the abdomen and pelvis was performed using the standard protocol following bolus administration of intravenous contrast.  CONTRAST: 16mL ISOVUE-300 IOPAMIDOL (ISOVUE-300) INJECTION 61%  COMPARISON: None.  Creatinine was obtained on site at [facility] at [address]  Results: Creatinine 0.8 mg/dL. GFR of 68  FINDINGS: Lower chest: Mild scarring is noted anteromedially within the right middle lobe. However no active process is seen within the lung bases. Cardiomegaly is present but no pericardial effusion is seen.  Hepatobiliary: The liver enhances and only a single small cyst is noted inferiorly in the right lobe of liver laterally. No calcified gallstones are seen. However, at the fundus of the gallbladder there is a rounded soft tissue structure present of approximately 11 mm in diameter. This could be associated with the fundus of the gallbladder and could possibly represent gallbladder carcinoma. Ultrasound of the right  upper quadrant is recommended to assess further versus MRI for more sensitive assessment.  Pancreas: The pancreas is normal in size in the pancreatic duct is not dilated.  Spleen: The spleen is unremarkable.  Adrenals/Urinary Tract: The adrenal glands appear normal. The kidneys enhance and there are small right renal cyst present. However no calculi are seen and there is no evidence of solid renal mass. Ureters appear normal in caliber. The urinary bladder is not well distended but no abnormality is seen.  Stomach/Bowel: The stomach is moderately distended oral contrast. A small polypoid defect in the fundus of the stomach could represent infolding of the gastric mucosa versus a gastric lesion such as a polyp. Unfortunately a few small bowel loops are clumped together in the left lower quadrant making assessment difficult. The mucosa appears somewhat thickened on a few loops of small bowel in the left lower quadrant and edema or infiltration of small bowel wall focally cannot be excluded. If concerned clinically for a small-bowel lesion then either small-bowel follow-through or perhaps MR of the abdomen would be helpful. There are multiple rectosigmoid colon diverticula noted but no diverticulitis is seen. There is a moderate amount of feces throughout the colon. The terminal ileum is unremarkable and by history the appendix has previously been resected.  Vascular/Lymphatic: There is moderate abdominal aortic atherosclerosis present. There is focal bulging of the mid and distal abdominal aorta to maximum diameter of 2.7 cm. Ectatic abdominal aorta at risk for aneurysm development. Recommend followup by ultrasound in 5 years. This recommendation follows ACR consensus guidelines: White Paper of the ACR Incidental Findings Committee II on Vascular Findings. J Am Coll Radiol 2013; 10:789-794.  Reproductive: The uterus is normal in size. No adnexal lesion is seen. No free fluid  is noted within the pelvis.  Other: None. No abdominal wall hernia is seen.  Musculoskeletal: The lumbar vertebrae are in normal alignment with degenerative disc disease most marked at L4-5 and L5-S1 levels. Degenerative changes are noted in the hips right greater than left.  IMPRESSION: 1. Rounded soft tissue structure of 1.1 cm near the fundus of the gallbladder of uncertain significance. Consider ultrasound of the gallbladder or possibly MRI for further assessment. No gallstones are seen. 2. Slight prominence of mucosa of a loop of small bowel in the left abdomen of uncertain significance, possibly simply due to lack of distension, but edema or infiltration cannot be excluded. 3. Probable infolding of mucosa within the fundus of the stomach versus polypoid gastric lesion. 4. Dilatation of the distal abdominal aorta with significant abdominal aortic atherosclerosis. Ectatic abdominal aorta at risk for aneurysm development. Recommend follow up by [place] in 5 years. This recommendation follows ACR consensus guidelines: White Paper of the ACR Incidental Findings Committee II on Vascular Findings. J Am Coll Radiol 2013; 10:789-794.   Electronically Signed By: Ivar Drape M.D. On: 01/01/2018 13:46    Past Surgical History Sabino Gasser, CMA; 11/04/2018 10:19 AM) Appendectomy Carotid Artery Surgery Left. Cataract Surgery Bilateral. Colon Polyp Removal - Colonoscopy  Allergies No Known Drug Allergies  Allergies Reconciled  Medication History amLODIPine Besylate (2.5MG  Tablet, Oral) Active. Levothyroxine Sodium (75MCG Tablet, Oral) Active. Metoprolol Succinate ER (25MG  Tablet ER 24HR, Oral) Active. Rosuvastatin Calcium (40MG  Tablet, Oral) Active. Medications Reconciled  Social History Alcohol use Occasional alcohol use. No caffeine use No drug use Tobacco use Former smoker.  Family History Colon Cancer Brother. Heart Disease  Father. Malignant Neoplasm Of Pancreas Mother.  Other Problems Thyroid Disease     Review of Systems  General Present- Weight Loss. Not Present- Appetite Loss, Chills, Fatigue, Fever, Night Sweats and Weight Gain. HEENT Present- Wears glasses/contact lenses. Not Present- Earache, Hearing Loss, Hoarseness, Nose Bleed, Oral Ulcers, Ringing in the Ears, Seasonal Allergies, Sinus Pain, Sore Throat, Visual Disturbances and Yellow Eyes. Respiratory Not Present- Bloody sputum, Chronic Cough, Difficulty Breathing, Snoring and Wheezing. Breast Not Present- Breast Mass, Breast Pain, Nipple Discharge and Skin Changes. Cardiovascular Present- Leg Cramps. Not Present- Chest Pain, Difficulty Breathing Lying Down, Palpitations, Rapid Heart Rate, Shortness of Breath and Swelling of Extremities. Gastrointestinal Present- Constipation. Not Present- Abdominal Pain, Bloating, Bloody Stool, Change in Bowel Habits, Chronic diarrhea, Difficulty Swallowing, Excessive gas, Gets full quickly at meals, Hemorrhoids, Indigestion, Nausea, Rectal Pain and Vomiting. Female Genitourinary Not Present- Frequency, Nocturia, Painful Urination, Pelvic Pain and Urgency. Musculoskeletal Not Present- Back Pain, Joint Pain, Joint Stiffness, Muscle Pain, Muscle Weakness and Swelling of Extremities. Neurological Not Present- Decreased Memory, Fainting, Headaches, Numbness, Seizures, Tingling, Tremor, Trouble walking and Weakness. Psychiatric Not Present- Anxiety, Bipolar, Change in Sleep Pattern, Depression, Fearful and Frequent crying. Endocrine Not Present- Cold Intolerance, Excessive Hunger, Hair Changes, Heat Intolerance, Hot flashes and New Diabetes. Hematology Not Present- Blood Thinners, Easy Bruising, Excessive bleeding, Gland problems, HIV and Persistent Infections.  Vitals  Weight: 99.13 lb Height: 62in Body Surface Area: 1.42 m Body Mass Index: 18.13 kg/m  Temp.: 97.59F(Oral)  Pulse: 79 (Regular)   P.OX: 98% (Room air) BP: 110/62 (Sitting, Left Arm, Standard)      Physical Exam   The physical exam findings are as follows: Note:WDWN in NAD Eyes: Pupils equal, round; sclera anicteric HENT: Oral mucosa moist; good dentition Neck:  No masses palpated, no thyromegaly Lungs: CTA bilaterally; normal respiratory effort CV: Regular rate and rhythm; no murmurs; extremities well-perfused with no edema Abd: +bowel sounds, soft, non-tender, no palpable organomegaly; Reducible right inguinal hernia - no sign of left inguinal hernia Skin: Warm, dry; no sign of jaundice Psychiatric - alert and oriented x 4; calm mood and affect    Assessment & Plan   INGUINAL HERNIA OF RIGHT SIDE WITHOUT OBSTRUCTION OR GANGRENE (K40.90)  Current Plans Schedule for Surgery - Right inguinal hernia repair with mesh. The surgical procedure has been discussed with the patient. Potential risks, benefits, alternative treatments, and expected outcomes have been explained. All of the patient's questions at this time have been answered. The likelihood of reaching the patient's treatment goal is good. The patient understand the proposed surgical procedure and wishes to proceed.   Jasmin Crane. Georgette Dover, MD, [facility]  General/ Trauma Surgery Beeper [phone]  02/25/2019 7:16 AM

## 2019-02-25 NOTE — Anesthesia Procedure Notes (Signed)
Procedure Name: Intubation Date/Time: 02/25/2019 7:37 AM Performed by: Jenne Campus, CRNA Pre-anesthesia Checklist: Patient identified, Emergency Drugs available, Suction available and Patient being monitored Patient Re-evaluated:Patient Re-evaluated prior to induction Oxygen Delivery Method: Circle System Utilized Preoxygenation: Pre-oxygenation with 100% oxygen Induction Type: IV induction Ventilation: Mask ventilation without difficulty Laryngoscope Size: Miller and 2 Grade View: Grade I Tube type: Oral Tube size: 7.0 mm Number of attempts: 1 Airway Equipment and Method: Stylet and Oral airway Placement Confirmation: ETT inserted through vocal cords under direct vision,  positive ETCO2 and breath sounds checked- equal and bilateral Secured at: 18 cm Tube secured with: Tape Dental Injury: Teeth and Oropharynx as per pre-operative assessment

## 2019-02-25 NOTE — Anesthesia Procedure Notes (Signed)
Anesthesia Regional Block: Quadratus lumborum   Pre-Anesthetic Checklist: ,, timeout performed, Correct Patient, Correct Site, Correct Laterality, Correct Procedure, Correct Position, site marked, Risks and benefits discussed,  Surgical consent,  Pre-op evaluation,  At surgeon's request and post-op pain management  Laterality: Right  Prep: Maximum Sterile Barrier Precautions used, chloraprep       Needles:  Injection technique: Single-shot  Needle Type: Echogenic Stimulator Needle     Needle Length: 9cm  Needle Gauge: 22     Additional Needles:   Procedures:,,,, ultrasound used (permanent image in chart),,,,  Narrative:  Start time: 02/25/2019 7:03 AM End time: 02/25/2019 7:13 AM Injection made incrementally with aspirations every 5 mL.  Performed by: Personally  Anesthesiologist: Freddrick March, MD  Additional Notes: Monitors applied. No increased pain on injection. No increased resistance to injection. Injection made in 5cc increments. Good needle visualization. Patient tolerated procedure well.

## 2019-02-25 NOTE — Op Note (Signed)
Hernia, Open, Procedure Note  Indications: The patient presented with a history of a right, reducible inguinal hernia.    Pre-operative Diagnosis: right reducible inguinal hernia Post-operative Diagnosis: same  Surgeon: Maia Petties   Assistants: Arrie Aran, RNFA  Anesthesia: General LMA anesthesia and TAP block  ASA Class: 2  Procedure Details  The patient was seen again in the Holding Room. The risks, benefits, complications, treatment options, and expected outcomes were discussed with the patient. The possibilities of reaction to medication, pulmonary aspiration, perforation of viscus, bleeding, recurrent infection, the need for additional procedures, and development of a complication requiring transfusion or further operation were discussed with the patient and/or family. The likelihood of success in repairing the hernia and returning the patient to their previous functional status is good.  There was concurrence with the proposed plan, and informed consent was obtained. The site of surgery was properly noted/marked. The patient was taken to the Operating Room, identified as Jasmin Crane, and the procedure verified as right inguinal hernia repair. A Time Out was held and the above information confirmed.  The patient was placed in the supine position and underwent induction of anesthesia. The lower abdomen and groin was prepped with Chloraprep and draped in the standard fashion, and 0.25% Marcaine with epinephrine was used to anesthetize the skin over the mid-portion of the inguinal canal. An oblique incision was made. Dissection was carried down through the subcutaneous tissue with cautery to the external oblique fascia.  We opened the external oblique fascia along the direction of its fibers to the external ring.    The floor of the inguinal canal was inspected and showed a 1 cm direct defect.  I dissected the round ligament and we divided this with cautery.  The hernia sac was dissected  free and reduced back into the preperitoneal space.  We used a right Progrip mesh which was inserted and deployed across the floor of the inguinal canal. The mesh was tucked underneath the external oblique fascia laterally.  The opening in the mesh was closed with 0 Vicryl. The mesh was secured to the pubic tubercle  and the shelving edge with 0 Vicryl.  The external oblique fascia was reapproximated with 2-0 Vicryl.  3-0 Vicryl was used to close the subcutaneous tissues and 4-0 Monocryl was used to close the skin in subcuticular fashion.  Benzoin and steri-strips were used to seal the incision.  A clean dressing was applied.  The patient was then extubated and brought to the recovery room in stable condition.  All sponge, instrument, and needle counts were correct prior to closure and at the conclusion of the case.   Estimated Blood Loss: Minimal                 Complications: None; patient tolerated the procedure well.         Disposition: PACU - hemodynamically stable.         Condition: stable  Imogene Burn. Georgette Dover, MD, [facility]  General/ Trauma Surgery Beeper [phone]  02/25/2019 8:18 AM

## 2019-02-25 NOTE — Discharge Instructions (Signed)
[facility] ° ° INGUINAL HERNIA REPAIR: POST OP INSTRUCTIONS ° °Always review your discharge instruction sheet given to you by the facility where your surgery was performed. °IF YOU HAVE DISABILITY OR FAMILY LEAVE FORMS, YOU MUST BRING THEM TO THE OFFICE FOR PROCESSING.   °DO NOT GIVE THEM TO YOUR DOCTOR. ° °1. A  prescription for pain medication may be given to you upon discharge.  Take your pain medication as prescribed, if needed.  If narcotic pain medicine is not needed, then you may take acetaminophen (Tylenol) or ibuprofen (Advil) as needed. °2. Take your usually prescribed medications unless otherwise directed. °3. If you need a refill on your pain medication, please contact your pharmacy.  They will contact our office to request authorization. Prescriptions will not be filled after 5 pm or on week-ends. °4. You should follow a light diet the first 24 hours after arrival home, such as soup and crackers, etc.  Be sure to include lots of fluids daily.  Resume your normal diet the day after surgery. °5. Most patients will experience some swelling and bruising around the umbilicus or in the groin and scrotum.  Ice packs and reclining will help.  Swelling and bruising can take several days to resolve.  °6. It is common to experience some constipation if taking pain medication after surgery.  Increasing fluid intake and taking a stool softener (such as Colace) will usually help or prevent this problem from occurring.  A mild laxative (Milk of Magnesia or Miralax) should be taken according to package directions if there are no bowel movements after 48 hours. °7. Unless discharge instructions indicate otherwise, you may remove your bandages 24-48 hours after surgery, and you may shower at that time.  You will have steri-strips (small skin tapes) in place directly over the incision.  These strips should be left on the skin for 7-10 days. °8. ACTIVITIES:  You may resume regular (light) daily activities  beginning the next day--such as daily self-care, walking, climbing stairs--gradually increasing activities as tolerated.  You may have sexual intercourse when it is comfortable.  Refrain from any heavy lifting or straining until approved by your doctor. °a. You may drive when you are no longer taking prescription pain medication, you can comfortably wear a seatbelt, and you can safely maneuver your car and apply brakes. °b. RETURN TO WORK:  2-3 weeks with light duty - no lifting over 15 lbs. °9. You should see your doctor in the office for a follow-up appointment approximately 2-3 weeks after your surgery.  Make sure that you call for this appointment within a day or two after you arrive home to insure a convenient appointment time. °10. OTHER INSTRUCTIONS:  __________________________________________________________________________________________________________________________________________________________________________________________  °WHEN TO CALL YOUR DOCTOR: °1. Fever over 101.0 °2. Inability to urinate °3. Nausea and/or vomiting °4. Extreme swelling or bruising °5. Continued bleeding from incision. °6. Increased pain, redness, or drainage from the incision ° °The clinic staff is available to answer your questions during regular business hours.  Please don’t hesitate to call and ask to speak to one of the nurses for clinical concerns.  If you have a medical emergency, go to the nearest emergency room or call 911.  A surgeon from [facility] is always on call at the hospital ° ° °[address], [address], [place], [place]  [address] ? ° [address] [address], [place], [place]   [address] °[phone]    [phone]    FAX [phone] °Web site:   [email or web address] ° ° °

## 2019-02-26 ENCOUNTER — Encounter (HOSPITAL_COMMUNITY): Payer: Self-pay | Admitting: Surgery

## 2019-03-07 ENCOUNTER — Telehealth: Payer: Self-pay | Admitting: Cardiology

## 2019-03-07 NOTE — Telephone Encounter (Signed)
New Message             Patient is calling in to get an October appointment, need verification to put someone on the schedule. Pls call patient to advise.

## 2019-03-13 NOTE — Telephone Encounter (Signed)
Left message to call back tomorrow to get scheduled

## 2019-03-14 NOTE — Telephone Encounter (Signed)
Scheduled patient, aware of date and time

## 2019-05-27 NOTE — Progress Notes (Signed)
Cardiology Office Note   Date:  05/29/2019   ID:  Jasmin Crane, DOB 01-10-1932, MRN [id number]  PCP:  Hayden Rasmussen, MD  Cardiologist:   Minus Breeding, MD    Chief Complaint  Patient presents with  . Coronary Artery Disease      History of Present Illness: Jasmin Crane is a 85 y.o. female who presents for evaluation of coronary artery disease. She was living in [place] and moved here with her daughter.  The first diagnosis of coronary disease was apparently in 1998. She had stenting of her right coronary artery that time. She's had a well preserved ejection fraction over the years. Her last echocardiogram in August of 2018 demonstrated an EF of 60%. She does have some aortic sclerosis with moderate aortic insufficiency.  She does have carotid stenosis with right 80% stenosis and left 15-49% stenosis. She has some moderately had normal ABIs left greater than right. Does have some symptoms of claudication. She's never had any neurologic symptoms.  She had a stress perfusion study in August 2017 that demonstrated moderate to high suspicion for ischemia. However, this was followed up with coronary CTA. She had a long calcification of the left main and proximal LAD but the severity of this could not be quantified secondary to motion artifact. There was moderate stenosis of the left circumflex. The right coronary artery could not be assessed secondary to artifact. It was decided however manage her medically.   If she had to undergo carotid endarterectomy she would likely need cardiac catheterization.    Since I last saw her she had an inguinal hernia repair.  She says she has done well.  She walks on the treadmill.  She walks with a group of friends.The patient denies any new symptoms such as chest discomfort, neck or arm discomfort. There has been no new shortness of breath, PND or orthopnea. There have been no reported palpitations, presyncope or syncope.  Last year she had a lot of  abdominal complaints but this is resolved.    Past Medical History:  Diagnosis Date  . Arrhythmia   . Cancer ([place])    skin cancer 2005  . Carotid artery occlusion   . Claudication of lower extremity ([place])   . Coronary artery disease   . Diverticulitis   . Hyperlipidemia   . Hypertension   . Hypothyroidism   . Myopathy   . Paralysis of tongue   . Sick sinus syndrome [facility])     Past Surgical History:  Procedure Laterality Date  . APPENDECTOMY    . CAROTID ENDARTERECTOMY    . CATARACT EXTRACTION, BILATERAL    . CESAREAN SECTION    . COLONOSCOPY    . CORONARY ANGIOPLASTY    . CORONARY STENT PLACEMENT     right coronary artery 1998  . INGUINAL HERNIA REPAIR Right 02/25/2019   Procedure: RIGHT INGUINAL HERNIA REPAIR WITH MESH;  Surgeon: Donnie Mesa, MD;  Location: [facility];  Service: General;  Laterality: Right;  LMA AND TAP BLOCK  . MULTIPLE TOOTH EXTRACTIONS    . SALIVARY GLAND SURGERY     removed 1998  . SALPINGECTOMY    . SKIN CANCER EXCISION     2005     Current Outpatient Medications  Medication Sig Dispense Refill  . amLODipine (NORVASC) 2.5 MG tablet Take 1.25 mg by mouth daily.     [name] aspirin EC 81 MG tablet Take 81 mg by mouth daily.    [name] levothyroxine (SYNTHROID, LEVOTHROID)  75 MCG tablet Take 75 mcg by mouth daily before breakfast.     . metoprolol succinate (TOPROL-XL) 25 MG 24 hr tablet TAKE 1 TABLET BY MOUTH EVERY DAY (Patient taking differently: Take 25 mg by mouth daily. ) 90 tablet 3  . rosuvastatin (CRESTOR) 40 MG tablet TAKE 1 TABLET BY MOUTH AT BEDTIME (Patient taking differently: Take 40 mg by mouth every evening. ) 90 tablet 3  . vitamin C (ASCORBIC ACID) 500 MG tablet Take 500 mg by mouth daily.     No current facility-administered medications for this visit.     Allergies:   Cozaar [losartan], Pletaal [cilostazol], and Scallops [shellfish allergy]    ROS:  Please see the history of present illness.   Otherwise, review of systems are positive  for none  All other systems are reviewed and negative.    PHYSICAL EXAM: VS:  BP (!) 144/82   Pulse 67   Temp (!) 96.8 F (36 C)   Ht 5\' 2"  (1.575 m)   Wt 105 lb (47.6 kg)   BMI 19.20 kg/m  , BMI Body mass index is 19.2 kg/m.  GENERAL:  Well appearing NECK:  No jugular venous distention, waveform within normal limits, carotid upstroke brisk and symmetric, positive bruits, no thyromegaly LUNGS:  Clear to auscultation bilaterally CHEST:  Unremarkable HEART:  PMI not displaced or sustained,S1 and S2 within normal limits, no S3, no S4, no clicks, no rubs, no murmurs ABD:  Flat, positive bowel sounds normal in frequency in pitch, no bruits, no rebound, no guarding, no midline pulsatile mass, no hepatomegaly, no splenomegaly EXT:  2 plus pulses throughout, no edema, no cyanosis no clubbing   EKG:  EKG is  ordered today. Sinus rhythm, rate 67, axis within normal limits, intervals within normal limits, no acute ST-T wave changes.  Recent Labs: 06/11/2018: ALT 17; TSH 0.024 02/21/2019: BUN 15; Creatinine, Ser 0.89; Hemoglobin 13.2; Platelets 146; Potassium 4.5; Sodium 142    Lipid Panel    Component Value Date/Time   CHOL 185 06/11/2018 1026   TRIG 77 06/11/2018 1026   HDL 88 06/11/2018 1026   CHOLHDL 2.1 06/11/2018 1026   CHOLHDL 2.0 09/01/2016 1226   VLDL 15 09/01/2016 1226   LDLCALC 82 06/11/2018 1026      Wt Readings from Last 3 Encounters:  05/29/19 105 lb (47.6 kg)  02/25/19 102 lb (46.3 kg)  02/21/19 102 lb (46.3 kg)      Other studies Reviewed: Additional studies/ records that were reviewed today include: Hospital records Review of the above records demonstrates:  See above   ASSESSMENT AND PLAN:  CAD:    The patient has no ongoing symptoms.  He is exercising routinely.  No change in therapy.  PVD:   She does have reduced ABIs.  She has diminished pulses but she is asymptomatic.  She will continue with her walking regimen.  DYSLIPIDEMIA:     LDL was 82 but  the HDL was 88 so I think the ratio is excellent.  She will continue the meds as listed.  HTN:  The blood pressure is mildly elevated but this is unusual and she is usually well controlled 60 continue with the meds as listed.   HYPOTHYROIDISM:    TSH was low.  However this was followed up by her PCP and was normal.  No change in therapy.   We called Hayden Rasmussen, MD office to follow up on this.   AI:   This was moderate.  The patient would certainly want conservative therapy.  AAA:   This was 3.  0 cm a couple of years ago.  No further imaging.   ABDOMINAL COMPLAINTS:   This resolved after the hernia repair.  CAROTID STENOSIS: The patient really does not want follow-up of this at this point but agrees to have that done next year when she comes back.  Current medicines are reviewed at length with the patient today.  The patient does not have concerns regarding medicines.  The following changes have been made:  None  Labs/ tests ordered today include:  None  Orders Placed This Encounter  Procedures  . EKG 12-Lead  . VAS US CAROTID     Disposition:   FU with me in 12 months.   Signed, Minus Breeding, MD  05/29/2019 9:56 AM    [facility]

## 2019-05-28 ENCOUNTER — Encounter: Payer: Self-pay | Admitting: Cardiology

## 2019-05-28 DIAGNOSIS — E785 Hyperlipidemia, unspecified: Secondary | ICD-10-CM | POA: Insufficient documentation

## 2019-05-29 ENCOUNTER — Other Ambulatory Visit: Payer: Self-pay

## 2019-05-29 ENCOUNTER — Encounter: Payer: Self-pay | Admitting: Cardiology

## 2019-05-29 ENCOUNTER — Ambulatory Visit (INDEPENDENT_AMBULATORY_CARE_PROVIDER_SITE_OTHER): Payer: Medicare Other | Admitting: Cardiology

## 2019-05-29 VITALS — BP 144/82 | HR 67 | Temp 96.8°F | Ht 62.0 in | Wt 105.0 lb

## 2019-05-29 DIAGNOSIS — I351 Nonrheumatic aortic (valve) insufficiency: Secondary | ICD-10-CM

## 2019-05-29 DIAGNOSIS — I714 Abdominal aortic aneurysm, without rupture, unspecified: Secondary | ICD-10-CM

## 2019-05-29 DIAGNOSIS — I2583 Coronary atherosclerosis due to lipid rich plaque: Secondary | ICD-10-CM | POA: Diagnosis not present

## 2019-05-29 DIAGNOSIS — I10 Essential (primary) hypertension: Secondary | ICD-10-CM

## 2019-05-29 DIAGNOSIS — I701 Atherosclerosis of renal artery: Secondary | ICD-10-CM | POA: Diagnosis not present

## 2019-05-29 DIAGNOSIS — I2510 Atherosclerotic heart disease of native coronary artery without angina pectoris: Secondary | ICD-10-CM

## 2019-05-29 DIAGNOSIS — E785 Hyperlipidemia, unspecified: Secondary | ICD-10-CM

## 2019-05-29 DIAGNOSIS — I739 Peripheral vascular disease, unspecified: Secondary | ICD-10-CM

## 2019-05-29 NOTE — Patient Instructions (Addendum)
Medication Instructions:  Your physician recommends that you continue on your current medications as directed. Please refer to the Current Medication list given to you today.  If you need a refill on your cardiac medications before your next appointment, please call your pharmacy.   Lab work: NONE  Testing/Procedures: Your physician has requested that you have a carotid duplex. This test is an ultrasound of the carotid arteries in your neck. It looks at blood flow through these arteries that supply the brain with blood. Allow one hour for this exam. There are no restrictions or special instructions. Schedule in one year with follow up appointment.    Follow-Up: At [facility], you and your health needs are our priority.  As part of our continuing mission to provide you with exceptional heart care, we have created designated Provider Care Teams.  These Care Teams include your primary Cardiologist (physician) and Advanced Practice Providers (APPs -  Physician Assistants and Nurse Practitioners) who all work together to provide you with the care you need, when you need it. You will need a follow up appointment in 12 months.  Please call our office 2 months in advance to schedule this appointment.  You may see Minus Breeding, MD or one of the following Advanced Practice Providers on your designated Care Team:   Rosaria Ferries, PA-C Jory Sims, DNP, ANP  Any Other Special Instructions Will Be Listed Below (If Applicable). Schedule Carotid Duplex with yearly follow-up.

## 2019-06-10 ENCOUNTER — Other Ambulatory Visit (HOSPITAL_COMMUNITY): Payer: Self-pay | Admitting: Cardiovascular Disease

## 2019-06-10 DIAGNOSIS — I714 Abdominal aortic aneurysm, without rupture, unspecified: Secondary | ICD-10-CM

## 2019-06-12 ENCOUNTER — Ambulatory Visit (HOSPITAL_COMMUNITY)
Admission: RE | Admit: 2019-06-12 | Discharge: 2019-06-12 | Disposition: A | Discharge status: Home / Self Care | Payer: Medicare Other | Source: Ambulatory Visit | Attending: Cardiology | Admitting: Cardiology

## 2019-06-12 ENCOUNTER — Other Ambulatory Visit: Payer: Self-pay

## 2019-06-12 ENCOUNTER — Ambulatory Visit (HOSPITAL_BASED_OUTPATIENT_CLINIC_OR_DEPARTMENT_OTHER)
Admission: RE | Admit: 2019-06-12 | Discharge: 2019-06-12 | Disposition: A | Discharge status: Home / Self Care | Payer: Medicare Other | Source: Ambulatory Visit | Attending: Cardiovascular Disease | Admitting: Cardiovascular Disease

## 2019-06-12 ENCOUNTER — Other Ambulatory Visit (HOSPITAL_COMMUNITY): Payer: Self-pay | Admitting: Cardiovascular Disease

## 2019-06-12 DIAGNOSIS — I714 Abdominal aortic aneurysm, without rupture, unspecified: Secondary | ICD-10-CM

## 2019-06-12 DIAGNOSIS — I739 Peripheral vascular disease, unspecified: Secondary | ICD-10-CM

## 2019-06-12 DIAGNOSIS — I77811 Abdominal aortic ectasia: Secondary | ICD-10-CM

## 2019-06-13 ENCOUNTER — Other Ambulatory Visit: Payer: Self-pay

## 2019-06-13 NOTE — Progress Notes (Signed)
Not needed

## 2019-07-23 ENCOUNTER — Other Ambulatory Visit: Payer: Self-pay | Admitting: Cardiology

## 2019-11-03 ENCOUNTER — Other Ambulatory Visit: Payer: Self-pay | Admitting: Cardiology

## 2019-11-05 ENCOUNTER — Telehealth: Payer: Self-pay | Admitting: Cardiology

## 2019-11-05 NOTE — Telephone Encounter (Signed)
*  STAT* If patient is at the pharmacy, call can be transferred to refill team.   1. Which medications need to be refilled? (please list name of each medication and dose if known)  rosuvastatin (CRESTOR) 40 MG tablet  2. Which pharmacy/location (including street and city if local pharmacy) is medication to be sent to?  [facility] - [place], [place] - [address]  3. Do they need a 30 day or 90 day supply? 90 with refills

## 2019-11-06 ENCOUNTER — Other Ambulatory Visit: Payer: Self-pay

## 2020-01-07 ENCOUNTER — Other Ambulatory Visit: Payer: Self-pay | Admitting: Cardiology

## 2020-04-17 ENCOUNTER — Other Ambulatory Visit: Payer: Self-pay

## 2020-04-17 ENCOUNTER — Ambulatory Visit (HOSPITAL_COMMUNITY)
Admission: EM | Admit: 2020-04-17 | Discharge: 2020-04-17 | Disposition: A | Discharge status: Home / Self Care | Payer: Medicare Other | Attending: Emergency Medicine | Admitting: Emergency Medicine

## 2020-04-17 ENCOUNTER — Encounter (HOSPITAL_COMMUNITY): Payer: Self-pay

## 2020-04-17 DIAGNOSIS — R29898 Other symptoms and signs involving the musculoskeletal system: Secondary | ICD-10-CM | POA: Insufficient documentation

## 2020-04-17 LAB — COMPREHENSIVE METABOLIC PANEL
ALT: 16 U/L (ref 0–44)
AST: 32 U/L (ref 15–41)
Albumin: 4.1 g/dL (ref 3.5–5.0)
Alkaline Phosphatase: 53 U/L (ref 38–126)
Anion gap: 10 (ref 5–15)
BUN: 12 mg/dL (ref 8–23)
CO2: 27 mmol/L (ref 22–32)
Calcium: 9.8 mg/dL (ref 8.9–10.3)
Chloride: 105 mmol/L (ref 98–111)
Creatinine, Ser: 0.92 mg/dL (ref 0.44–1.00)
GFR calc Af Amer: >60 mL/min (ref >60)
GFR calc non Af Amer: 56 mL/min — ABNORMAL LOW (ref >60)
Glucose, Bld: 99 mg/dL (ref 70–99)
Potassium: 4.4 mmol/L (ref 3.5–5.1)
Sodium: 142 mmol/L (ref 135–145)
Total Bilirubin: 0.6 mg/dL (ref 0.3–1.2)
Total Protein: 7.4 g/dL (ref 6.5–8.1)

## 2020-04-17 NOTE — ED Provider Notes (Signed)
[facility]    CSN: [id number] Arrival date & time: 04/17/20  1715      History   Chief Complaint Chief Complaint  Patient presents with   Hand Injury    HPI Jasmin Crane is a 86 y.o. female.   Jasmin Crane presents with complaints of "sticky things coming out of (her) hands."  She has noticed this recently and her hands will turn red. Then it will go away. This occurs a few times a day. Has been occurring for the past 7 months, approximately. States it can happen approximately 4 times a day. No specific known trigger. She uses an example that she handed a friend a piece of paper and they asked why it was sticky. No current symptoms. No numbness or tingling. No pain. No claudication or blanching of fingers. She is concerned that this may be a symptom of her heart, as she has had silent MI's in the past, and she looked online about her symptoms which triggered her concern and brought her in tonight. Follows with cardiology and with her PCP.  No chest pain . No shortness of breath . No dizziness.    ROS per HPI, negative if not otherwise mentioned.      Past Medical History:  Diagnosis Date   Arrhythmia    Cancer [facility])    skin cancer 2005   Carotid artery occlusion    Claudication of lower extremity ([facility])    Coronary artery disease    Diverticulitis    Hyperlipidemia    Hypertension    Hypothyroidism    Myopathy    Paralysis of tongue    Sick sinus syndrome [facility])     Patient Active Problem List   Diagnosis Date Noted   Dyslipidemia 05/28/2019   Medication management 06/10/2018   Other fatigue 06/10/2018   PVD (peripheral vascular disease) ([place]) 06/10/2018   Right lower quadrant abdominal pain 06/10/2018   AAA (abdominal aortic aneurysm) without rupture ([place]) 06/10/2018   Aortic valve regurgitation 06/10/2018   Carotid artery disease ([place]) 03/06/2017   Claudication in peripheral vascular disease ([place]) 03/06/2017   Essential  hypertension 03/06/2017   Hyperlipidemia 03/06/2017   Coronary artery disease due to lipid rich plaque 03/06/2017    Past Surgical History:  Procedure Laterality Date   APPENDECTOMY     CAROTID ENDARTERECTOMY     CATARACT EXTRACTION, BILATERAL     CESAREAN SECTION     COLONOSCOPY     CORONARY ANGIOPLASTY     CORONARY STENT PLACEMENT     right coronary artery 1998   INGUINAL HERNIA REPAIR Right 02/25/2019   Procedure: RIGHT INGUINAL HERNIA REPAIR WITH MESH;  Surgeon: Donnie Mesa, MD;  Location: [facility];  Service: General;  Laterality: Right;  LMA AND TAP BLOCK   MULTIPLE TOOTH EXTRACTIONS     SALIVARY GLAND SURGERY     removed 1998   SALPINGECTOMY     SKIN CANCER EXCISION     2005    OB History   No obstetric history on file.      Home Medications    Prior to Admission medications   Medication Sig Start Date End Date Taking? Authorizing Provider  amLODipine (NORVASC) 2.5 MG tablet Take 1.25 mg by mouth daily.     [provider]  aspirin EC 81 MG tablet Take 81 mg by mouth daily.    [provider]  levothyroxine (SYNTHROID, LEVOTHROID) 75 MCG tablet Take 75 mcg by mouth daily before breakfast.  11/05/18  [provider]  metoprolol succinate (TOPROL-XL) 25 MG 24 hr tablet TAKE 1 TABLET BY MOUTH EVERY DAY 07/23/19   Minus Breeding, MD  rosuvastatin (CRESTOR) 40 MG tablet Take 1 tablet (40 mg total) by mouth at bedtime. 01/07/20   Minus Breeding, MD  vitamin C (ASCORBIC ACID) 500 MG tablet Take 500 mg by mouth daily.    [provider]    Family History Family History  Problem Relation Age of Onset   Cancer Mother    Stroke Mother    Heart disease Father 60       CAD   Heart failure Sister    Arthritis Brother    Lung cancer Brother    Pancreatic cancer Brother    Heart attack Son 43    Social History Social History   Tobacco Use   Smoking status: Former Smoker    Types: Cigarettes   Smokeless  tobacco: Never Used   Tobacco comment: Quit tobacco around [employer] Use   Vaping Use: Never used  Substance Use Topics   Alcohol use: Yes    Comment: rare cocktail   Drug use: Never     Allergies   Cozaar [losartan], Pletaal [cilostazol], and Scallops [shellfish allergy]   Review of Systems Review of Systems   Physical Exam Triage Vital Signs ED Triage Vitals  Enc Vitals Group     BP 04/17/20 1835 (!) 169/77     Pulse Rate 04/17/20 1835 69     Resp 04/17/20 1835 18     Temp 04/17/20 1835 (!) 97.5 F (36.4 C)     Temp Source 04/17/20 1835 Oral     SpO2 04/17/20 1835 97 %     Weight --      Height --      Head Circumference --      Peak Flow --      Pain Score 04/17/20 1842 0     Pain Loc --      Pain Edu? --      Excl. in [place]? --    No data found.  Updated Vital Signs BP (!) 169/77 (BP Location: Right Arm)    Pulse 69    Temp (!) 97.5 F (36.4 C) (Oral)    Resp 18    SpO2 97%   Visual Acuity Right Eye Distance:   Left Eye Distance:   Bilateral Distance:    Right Eye Near:   Left Eye Near:    Bilateral Near:     Physical Exam Constitutional:      General: She is not in acute distress.    Appearance: She is well-developed.  Cardiovascular:     Rate and Rhythm: Normal rate.  Pulmonary:     Effort: Pulmonary effort is normal.  Skin:    General: Skin is warm and dry.     Comments: No fluid, liquid or product palpable to hands; no claudication to hands; no rash; cap refill < 2 seconds; sensation intact   Neurological:     Mental Status: She is alert and oriented to person, place, and time.    EKG:  NSR rate of 74 . Previous EKG was available for review. No stwave changes as interpreted by me.     UC Treatments / Results  Labs (all labs ordered are listed, but only abnormal results are displayed) Labs Reviewed  COMPREHENSIVE METABOLIC PANEL - Abnormal; Notable for the following components:      Result Value   GFR calc  non Af Amer 56 (*)     All other components within normal limits    EKG   Radiology No results found.  Procedures Procedures (including critical care time)  Medications Ordered in UC Medications - No data to display  Initial Impression / Assessment and Plan / UC Course  I have reviewed the triage vital signs and the nursing notes.  Pertinent labs & imaging results that were available during my care of the patient were reviewed by me and considered in my medical decision making (see chart for details).    CMP collected as patient requests blood sugar check as well. She is concerned about potential cardiac source of her hand symptoms. No current symptoms. No red flag findings today in urgent care. Emphasized follow up with her PCP and/ or cardiologist for recheck. Patient and her daughter verbalized understanding and agreeable to plan.    Final Clinical Impressions(s) / UC Diagnoses   Final diagnoses:  Hand disorder     Discharge Instructions     I am checking basic labs and would call you if there are any concerning findings.  Your ekg looks unchanged tonight.  I would like you to follow up with your PCP and/or cardiologist for recheck, especially if your symptoms persist.  Please go to the ER for any chest pain , dizziness, shortness of breath , or otherwise worsening or concerned    ED Prescriptions    None     PDMP not reviewed this encounter.   Zigmund Gottron, NP 04/18/20 1021

## 2020-04-17 NOTE — ED Triage Notes (Signed)
Pt present stickiness to both hands with discoloration. Pt has been advise these symptoms can affect heart. Symptoms started 8 wks ago

## 2020-04-17 NOTE — Discharge Instructions (Signed)
I am checking basic labs and would call you if there are any concerning findings.  Your ekg looks unchanged tonight.  I would like you to follow up with your PCP and/or cardiologist for recheck, especially if your symptoms persist.  Please go to the ER for any chest pain , dizziness, shortness of breath , or otherwise worsening or concerned

## 2020-04-22 ENCOUNTER — Telehealth: Payer: Self-pay | Admitting: Physician Assistant

## 2020-04-22 NOTE — Telephone Encounter (Signed)
Patient called for an appointment (already scheduled for 06/09/2020) for hand problem.  Patient says that her hands are sticky and turning pink for few weeks.  Wants to know if there is anything that can be recommended.  (chart # [id number])

## 2020-04-22 NOTE — Telephone Encounter (Signed)
Called patient to let her know that we aren't able to give any advise on what to use/do without looking at her hands.  Patient understood

## 2020-05-14 ENCOUNTER — Telehealth: Payer: Self-pay | Admitting: Cardiology

## 2020-05-14 NOTE — Telephone Encounter (Signed)
Did not see where anyone had called patient. Told patient this as well. Informed patient a message would be sent to Dr. Rosezella [place] nurse to see if she had tried to call patient. Informed patient that they will call her back if needed.

## 2020-05-14 NOTE — Telephone Encounter (Signed)
New message:     Patient states that some call her, I did not see a message.

## 2020-05-20 ENCOUNTER — Ambulatory Visit (INDEPENDENT_AMBULATORY_CARE_PROVIDER_SITE_OTHER): Payer: Medicare Other | Admitting: Physician Assistant

## 2020-05-20 ENCOUNTER — Other Ambulatory Visit: Payer: Self-pay

## 2020-05-20 ENCOUNTER — Encounter: Payer: Self-pay | Admitting: Physician Assistant

## 2020-05-20 DIAGNOSIS — L301 Dyshidrosis [pompholyx]: Secondary | ICD-10-CM

## 2020-05-20 DIAGNOSIS — D485 Neoplasm of uncertain behavior of skin: Secondary | ICD-10-CM | POA: Diagnosis not present

## 2020-05-20 MED ORDER — CLOBETASOL PROP EMOLLIENT BASE 0.05 % EX CREA: 1.0000 "application " | TOPICAL_CREAM | Freq: Two times a day (BID) | CUTANEOUS | 2 refills | Status: DC

## 2020-05-20 NOTE — Progress Notes (Signed)
   Follow up Visit  Subjective  Jasmin Crane is a 86 y.o. 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 For 1-2 months. She has not tried to treat them. She says they do not itch or burn or hurt.   Objective  Well appearing patient in no apparent distress; mood and affect are within normal limits.  A focused examination was performed including face, hands, arms and back. Relevant physical exam findings are noted in the Assessment and Plan. No suspicious moles noted on back.   Objective  Right Forehead: Pink bump     Objective  Left Hand - Anterior, Right Hand - Anterior: Her hands re quite clear today. No vesicles or peeling is noted There is slight erythema on the palms.  Assessment & Plan  Neoplasm of uncertain behavior of skin Right Forehead  Skin / nail biopsy Type of biopsy: tangential   Informed consent: discussed and consent obtained   Timeout: patient name, date of birth, surgical site, and procedure verified   Procedure prep:  Patient was prepped and draped in usual sterile fashion (Non sterile) Prep type:  Chlorhexidine Anesthesia: the lesion was anesthetized in a standard fashion   Anesthetic:  1% lidocaine w/ epinephrine 1-100,000 local infiltration Instrument used: flexible razor blade   Outcome: patient tolerated procedure well   Post-procedure details: wound care instructions given    Specimen 1 - Surgical pathology Differential Diagnosis: bcc vs scc Check Margins: No  Dyshidrosis (2) Left Hand - Anterior; Right Hand - Anterior  Discussed excess water exposure.She can apply the medicine if the hands are burning, itching or broken out. If they are clear she should just use moisturizer.   Ordered Medications: Clobetasol Prop Emollient Base (CLOBETASOL  PROPIONATE E) 0.05 % emollient cream

## 2020-05-20 NOTE — Patient Instructions (Signed)

## 2020-05-28 ENCOUNTER — Telehealth: Payer: Self-pay | Admitting: Physician Assistant

## 2020-05-28 NOTE — Telephone Encounter (Signed)
Patient is calling to get a printed detailed explanation of everything that was done and talked about at her last visit with Arlyss Gandy, PAC.  Patient got the After Visit Summary, but wants more detailed.

## 2020-05-30 DIAGNOSIS — Z7189 Other specified counseling: Secondary | ICD-10-CM | POA: Insufficient documentation

## 2020-05-30 NOTE — Progress Notes (Signed)
Cardiology Office Note   Date:  05/31/2020   ID:  Jasmin Crane, DOB 1931-12-25, MRN [id number]  PCP:  Hayden Rasmussen, MD  Cardiologist:   Minus Breeding, MD    Chief Complaint  Patient presents with  . Coronary Artery Disease      History of Present Illness: Jasmin Crane is a 86 y.o. female who presents for evaluation of coronary artery disease. She was living in [place] and moved here with her daughter.  The first diagnosis of coronary disease was apparently in 1998. She had stenting of her right coronary artery that time. She's had a well preserved ejection fraction over the years. Her last echocardiogram in August of 2018 demonstrated an EF of 60%. She does have some aortic sclerosis with moderate aortic insufficiency.  She does have carotid stenosis with right 80% stenosis and left 15-49% stenosis. She has some moderately had normal ABIs left greater than right. Does have some symptoms of claudication. She's never had any neurologic symptoms.  She had a stress perfusion study in August 2017 that demonstrated moderate to high suspicion for ischemia. However, this was followed up with coronary CTA. She had a long calcification of the left main and proximal LAD but the severity of this could not be quantified secondary to motion artifact. There was moderate stenosis of the left circumflex. The right coronary artery could not be assessed secondary to artifact. It was decided however manage her medically.   If she had to undergo carotid endarterectomy she would likely need cardiac catheterization.    Since I last saw her she has done okay.  She walks on a treadmill. The patient denies any new symptoms such as chest discomfort, neck or arm discomfort. There has been no new shortness of breath, PND or orthopnea. There have been no reported palpitations, presyncope or syncope.    Past Medical History:  Diagnosis Date  . Arrhythmia   . Cancer ([place])    skin cancer 2005  . Carotid  artery occlusion   . Claudication of lower extremity ([place])   . Coronary artery disease   . Diverticulitis   . Hyperlipidemia   . Hypertension   . Hypothyroidism   . Myopathy   . Paralysis of tongue   . Sick sinus syndrome [facility])     Past Surgical History:  Procedure Laterality Date  . APPENDECTOMY    . CAROTID ENDARTERECTOMY    . CATARACT EXTRACTION, BILATERAL    . CESAREAN SECTION    . COLONOSCOPY    . CORONARY ANGIOPLASTY    . CORONARY STENT PLACEMENT     right coronary artery 1998  . INGUINAL HERNIA REPAIR Right 02/25/2019   Procedure: RIGHT INGUINAL HERNIA REPAIR WITH MESH;  Surgeon: Donnie Mesa, MD;  Location: [place];  Service: General;  Laterality: Right;  LMA AND TAP BLOCK  . MULTIPLE TOOTH EXTRACTIONS    . SALIVARY GLAND SURGERY     removed 1998  . SALPINGECTOMY    . SKIN CANCER EXCISION     2005     Current Outpatient Medications  Medication Sig Dispense Refill  . amLODipine (NORVASC) 2.5 MG tablet Take 1.25 mg by mouth daily.     [name] aspirin EC 81 MG tablet Take 81 mg by mouth daily.    . Clobetasol Prop Emollient Base (CLOBETASOL PROPIONATE E) 0.05 % emollient cream Apply 1 application topically 2 (two) times daily. Not for face,folds or groin 60 g 2  . levothyroxine (SYNTHROID, LEVOTHROID) 75  MCG tablet Take 75 mcg by mouth daily before breakfast.     . metoprolol succinate (TOPROL-XL) 25 MG 24 hr tablet TAKE 1 TABLET BY MOUTH EVERY DAY 90 tablet 4  . rosuvastatin (CRESTOR) 40 MG tablet Take 1 tablet (40 mg total) by mouth at bedtime. 90 tablet 3  . vitamin C (ASCORBIC ACID) 500 MG tablet Take 500 mg by mouth daily.     No current facility-administered medications for this visit.    Allergies:   Cozaar [losartan], Pletaal [cilostazol], and Scallops [shellfish allergy]    ROS:  Please see the history of present illness.   Otherwise, review of systems are positive for none All other systems are reviewed and negative.    PHYSICAL EXAM: VS:  BP (!) 155/62    Pulse 73   Ht 5\' 2"  (1.575 m)   Wt 104 lb 6.4 oz (47.4 kg)   SpO2 98%   BMI 19.10 kg/m  , BMI Body mass index is 19.1 kg/m.  GENERAL:  Well appearing NECK:  No jugular venous distention, waveform within normal limits, carotid upstroke brisk and symmetric, bilateral carotid bruits, no thyromegaly LUNGS:  Clear to auscultation bilaterally CHEST:  Unremarkable HEART:  PMI not displaced or sustained,S1 and S2 within normal limits, no S3, no S4, no clicks, no rubs, soft brief apical systolic murmur, no diastolic murmurs ABD:  Flat, positive bowel sounds normal in frequency in pitch, no bruits, no rebound, no guarding, no midline pulsatile mass, no hepatomegaly, no splenomegaly EXT:  2 plus pulses throughout, no edema, no cyanosis no clubbing   EKG:  EKG is not ordered today.   Recent Labs: 04/17/2020: ALT 16; BUN 12; Creatinine, Ser 0.92; Potassium 4.4; Sodium 142    Lipid Panel    Component Value Date/Time   CHOL 185 06/11/2018 1026   TRIG 77 06/11/2018 1026   HDL 88 06/11/2018 1026   CHOLHDL 2.1 06/11/2018 1026   CHOLHDL 2.0 09/01/2016 1226   VLDL 15 09/01/2016 1226   LDLCALC 82 06/11/2018 1026      Wt Readings from Last 3 Encounters:  05/31/20 104 lb 6.4 oz (47.4 kg)  05/29/19 105 lb (47.6 kg)  02/25/19 102 lb (46.3 kg)      Other studies Reviewed: Additional studies/ records that were reviewed today include:  Labs, brief look at carotid Doppler.  Review of the above records demonstrates:  See above   ASSESSMENT AND PLAN:  CAD:  The patient has no new sypmtoms.  No further cardiovascular testing is indicated.  We will continue with aggressive risk reduction and meds as listed.  PVD:   She does have reduced ABIs.  She has diminished pulses but she is asymptomatic.  No change in therapy.   DYSLIPIDEMIA:     LDL was 82 with an HDL of 88 but this was 2019.  I will try to get more recent records from her primary provider.  HTN:  The blood pressure is at target at home  in the 115 range always.  Therefore, I will make no changes to her meds.   AI:   This was moderate.  I doubt that this is changed clinically.  I will follow this with serial exams.   AAA:   This was 3.0.  This was a few years ago.  No further imaging.  CAROTID STENOSIS: She has moderate less than 60% bilateral stenosis on Doppler today and I will await the final results but we will continue with risk reduction.  COVID EDUCATION: She has had her vaccines and we talked about the booster.  Current medicines are reviewed at length with the patient today.  The patient does not have concerns regarding medicines.  The following changes have been made:  Labs/ tests ordered today include:  None  No orders of the defined types were placed in this encounter.    Disposition:   FU with me in 12 months.   Signed, Minus Breeding, MD  05/31/2020 12:32 PM     [facility]

## 2020-05-31 ENCOUNTER — Ambulatory Visit (INDEPENDENT_AMBULATORY_CARE_PROVIDER_SITE_OTHER): Payer: Medicare Other | Admitting: Cardiology

## 2020-05-31 ENCOUNTER — Other Ambulatory Visit: Payer: Self-pay

## 2020-05-31 ENCOUNTER — Ambulatory Visit (HOSPITAL_COMMUNITY)
Admission: RE | Admit: 2020-05-31 | Discharge: 2020-05-31 | Disposition: A | Discharge status: Home / Self Care | Payer: Medicare Other | Source: Ambulatory Visit | Attending: Cardiology | Admitting: Cardiology

## 2020-05-31 ENCOUNTER — Encounter: Payer: Self-pay | Admitting: Cardiology

## 2020-05-31 ENCOUNTER — Other Ambulatory Visit (HOSPITAL_COMMUNITY): Payer: Self-pay | Admitting: Cardiology

## 2020-05-31 VITALS — BP 155/62 | HR 73 | Ht 62.0 in | Wt 104.4 lb

## 2020-05-31 DIAGNOSIS — I701 Atherosclerosis of renal artery: Secondary | ICD-10-CM | POA: Insufficient documentation

## 2020-05-31 DIAGNOSIS — I2510 Atherosclerotic heart disease of native coronary artery without angina pectoris: Secondary | ICD-10-CM | POA: Diagnosis present

## 2020-05-31 DIAGNOSIS — I739 Peripheral vascular disease, unspecified: Secondary | ICD-10-CM

## 2020-05-31 DIAGNOSIS — I6523 Occlusion and stenosis of bilateral carotid arteries: Secondary | ICD-10-CM

## 2020-05-31 DIAGNOSIS — I2583 Coronary atherosclerosis due to lipid rich plaque: Secondary | ICD-10-CM | POA: Insufficient documentation

## 2020-05-31 DIAGNOSIS — I714 Abdominal aortic aneurysm, without rupture, unspecified: Secondary | ICD-10-CM

## 2020-05-31 DIAGNOSIS — E785 Hyperlipidemia, unspecified: Secondary | ICD-10-CM

## 2020-05-31 DIAGNOSIS — I10 Essential (primary) hypertension: Secondary | ICD-10-CM | POA: Diagnosis not present

## 2020-05-31 DIAGNOSIS — I351 Nonrheumatic aortic (valve) insufficiency: Secondary | ICD-10-CM | POA: Diagnosis present

## 2020-05-31 DIAGNOSIS — I6529 Occlusion and stenosis of unspecified carotid artery: Secondary | ICD-10-CM

## 2020-05-31 DIAGNOSIS — Z7189 Other specified counseling: Secondary | ICD-10-CM

## 2020-05-31 NOTE — Telephone Encounter (Signed)
Patient stated she didn't get the after visit summary- informed patient that she can come by and pick up the summary at check in.

## 2020-05-31 NOTE — Patient Instructions (Signed)
Medication Instructions:   No changes   *If you need a refill on your cardiac medications before your next appointment, please call your pharmacy*   Lab Work:  Not needed     Testing/Procedures:  Not needed  Follow-Up: At [facility], you and your health needs are our priority.  As part of our continuing mission to provide you with exceptional heart care, we have created designated Provider Care Teams.  These Care Teams include your primary Cardiologist (physician) and Advanced Practice Providers (APPs -  Physician Assistants and Nurse Practitioners) who all work together to provide you with the care you need, when you need it.  We recommend signing up for the patient portal called "MyChart".  Sign up information is provided on this After Visit Summary.  MyChart is used to connect with patients for Virtual Visits (Telemedicine).  Patients are able to view lab/test results, encounter notes, upcoming appointments, etc.  Non-urgent messages can be sent to your provider as well.   To learn more about what you can do with MyChart, go to [email or web address].    Your next appointment:   12 month(s)  The format for your next appointment:   In Person  Provider:   James Hochrein, MD     

## 2020-06-09 ENCOUNTER — Ambulatory Visit: Payer: Medicare Other | Admitting: Physician Assistant

## 2020-06-24 ENCOUNTER — Other Ambulatory Visit (HOSPITAL_COMMUNITY): Payer: Self-pay | Admitting: Cardiovascular Disease

## 2020-06-24 ENCOUNTER — Ambulatory Visit (HOSPITAL_BASED_OUTPATIENT_CLINIC_OR_DEPARTMENT_OTHER)
Admission: RE | Admit: 2020-06-24 | Discharge: 2020-06-24 | Disposition: A | Discharge status: Home / Self Care | Payer: Medicare Other | Source: Ambulatory Visit | Attending: Cardiovascular Disease | Admitting: Cardiovascular Disease

## 2020-06-24 ENCOUNTER — Other Ambulatory Visit: Payer: Self-pay

## 2020-06-24 ENCOUNTER — Ambulatory Visit (HOSPITAL_COMMUNITY)
Admission: RE | Admit: 2020-06-24 | Discharge: 2020-06-24 | Disposition: A | Discharge status: Home / Self Care | Payer: Medicare Other | Source: Ambulatory Visit | Attending: Cardiovascular Disease | Admitting: Cardiovascular Disease

## 2020-06-24 DIAGNOSIS — I739 Peripheral vascular disease, unspecified: Secondary | ICD-10-CM

## 2020-06-24 DIAGNOSIS — I77811 Abdominal aortic ectasia: Secondary | ICD-10-CM

## 2020-06-24 DIAGNOSIS — I708 Atherosclerosis of other arteries: Secondary | ICD-10-CM

## 2020-06-25 ENCOUNTER — Other Ambulatory Visit: Payer: Self-pay

## 2020-06-25 DIAGNOSIS — I714 Abdominal aortic aneurysm, without rupture, unspecified: Secondary | ICD-10-CM

## 2020-06-25 DIAGNOSIS — I739 Peripheral vascular disease, unspecified: Secondary | ICD-10-CM

## 2020-06-25 NOTE — Progress Notes (Signed)
vas 

## 2020-08-02 ENCOUNTER — Other Ambulatory Visit: Payer: Self-pay | Admitting: Cardiology

## 2021-01-11 ENCOUNTER — Other Ambulatory Visit: Payer: Self-pay | Admitting: Cardiology

## 2021-03-08 ENCOUNTER — Inpatient Hospital Stay (HOSPITAL_COMMUNITY)
Admission: EM | Admit: 2021-03-08 | Discharge: 2021-03-11 | DRG: 291 | Disposition: A | Discharge status: Skilled Nursing Facility | Payer: Medicare Other | Attending: Internal Medicine | Admitting: Internal Medicine

## 2021-03-08 ENCOUNTER — Emergency Department (HOSPITAL_COMMUNITY): Payer: Medicare Other

## 2021-03-08 ENCOUNTER — Other Ambulatory Visit: Payer: Self-pay

## 2021-03-08 DIAGNOSIS — E039 Hypothyroidism, unspecified: Secondary | ICD-10-CM | POA: Diagnosis present

## 2021-03-08 DIAGNOSIS — I517 Cardiomegaly: Secondary | ICD-10-CM

## 2021-03-08 DIAGNOSIS — Z955 Presence of coronary angioplasty implant and graft: Secondary | ICD-10-CM

## 2021-03-08 DIAGNOSIS — Z793 Long term (current) use of hormonal contraceptives: Secondary | ICD-10-CM

## 2021-03-08 DIAGNOSIS — E7800 Pure hypercholesterolemia, unspecified: Secondary | ICD-10-CM | POA: Diagnosis not present

## 2021-03-08 DIAGNOSIS — J439 Emphysema, unspecified: Secondary | ICD-10-CM | POA: Diagnosis not present

## 2021-03-08 DIAGNOSIS — Z823 Family history of stroke: Secondary | ICD-10-CM

## 2021-03-08 DIAGNOSIS — Y92009 Unspecified place in unspecified non-institutional (private) residence as the place of occurrence of the external cause: Secondary | ICD-10-CM | POA: Diagnosis not present

## 2021-03-08 DIAGNOSIS — J90 Pleural effusion, not elsewhere classified: Secondary | ICD-10-CM | POA: Diagnosis not present

## 2021-03-08 DIAGNOSIS — I10 Essential (primary) hypertension: Secondary | ICD-10-CM | POA: Diagnosis not present

## 2021-03-08 DIAGNOSIS — Z800 Family history of malignant neoplasm of digestive organs: Secondary | ICD-10-CM

## 2021-03-08 DIAGNOSIS — S8002XA Contusion of left knee, initial encounter: Secondary | ICD-10-CM | POA: Diagnosis present

## 2021-03-08 DIAGNOSIS — T402X5A Adverse effect of other opioids, initial encounter: Secondary | ICD-10-CM | POA: Diagnosis not present

## 2021-03-08 DIAGNOSIS — W19XXXA Unspecified fall, initial encounter: Secondary | ICD-10-CM | POA: Diagnosis not present

## 2021-03-08 DIAGNOSIS — Z87891 Personal history of nicotine dependence: Secondary | ICD-10-CM

## 2021-03-08 DIAGNOSIS — Z681 Body mass index (BMI) 19 or less, adult: Secondary | ICD-10-CM | POA: Diagnosis not present

## 2021-03-08 DIAGNOSIS — S92351A Displaced fracture of fifth metatarsal bone, right foot, initial encounter for closed fracture: Secondary | ICD-10-CM | POA: Diagnosis present

## 2021-03-08 DIAGNOSIS — Z7722 Contact with and (suspected) exposure to environmental tobacco smoke (acute) (chronic): Secondary | ICD-10-CM | POA: Diagnosis present

## 2021-03-08 DIAGNOSIS — R0902 Hypoxemia: Secondary | ICD-10-CM | POA: Diagnosis not present

## 2021-03-08 DIAGNOSIS — S42211A Unspecified displaced fracture of surgical neck of right humerus, initial encounter for closed fracture: Secondary | ICD-10-CM | POA: Diagnosis present

## 2021-03-08 DIAGNOSIS — Z85828 Personal history of other malignant neoplasm of skin: Secondary | ICD-10-CM

## 2021-03-08 DIAGNOSIS — I110 Hypertensive heart disease with heart failure: Secondary | ICD-10-CM | POA: Diagnosis present

## 2021-03-08 DIAGNOSIS — R636 Underweight: Secondary | ICD-10-CM | POA: Diagnosis present

## 2021-03-08 DIAGNOSIS — E785 Hyperlipidemia, unspecified: Secondary | ICD-10-CM | POA: Diagnosis present

## 2021-03-08 DIAGNOSIS — J9601 Acute respiratory failure with hypoxia: Secondary | ICD-10-CM | POA: Diagnosis present

## 2021-03-08 DIAGNOSIS — N179 Acute kidney failure, unspecified: Secondary | ICD-10-CM | POA: Diagnosis not present

## 2021-03-08 DIAGNOSIS — I358 Other nonrheumatic aortic valve disorders: Secondary | ICD-10-CM | POA: Diagnosis present

## 2021-03-08 DIAGNOSIS — Y92019 Unspecified place in single-family (private) house as the place of occurrence of the external cause: Secondary | ICD-10-CM | POA: Diagnosis not present

## 2021-03-08 DIAGNOSIS — I714 Abdominal aortic aneurysm, without rupture: Secondary | ICD-10-CM | POA: Diagnosis present

## 2021-03-08 DIAGNOSIS — S92901A Unspecified fracture of right foot, initial encounter for closed fracture: Secondary | ICD-10-CM

## 2021-03-08 DIAGNOSIS — Z8261 Family history of arthritis: Secondary | ICD-10-CM

## 2021-03-08 DIAGNOSIS — G928 Other toxic encephalopathy: Secondary | ICD-10-CM | POA: Diagnosis not present

## 2021-03-08 DIAGNOSIS — I739 Peripheral vascular disease, unspecified: Secondary | ICD-10-CM | POA: Diagnosis present

## 2021-03-08 DIAGNOSIS — Z20822 Contact with and (suspected) exposure to covid-19: Secondary | ICD-10-CM | POA: Diagnosis present

## 2021-03-08 DIAGNOSIS — I2583 Coronary atherosclerosis due to lipid rich plaque: Secondary | ICD-10-CM | POA: Diagnosis present

## 2021-03-08 DIAGNOSIS — I2510 Atherosclerotic heart disease of native coronary artery without angina pectoris: Secondary | ICD-10-CM | POA: Diagnosis not present

## 2021-03-08 DIAGNOSIS — F0390 Unspecified dementia without behavioral disturbance: Secondary | ICD-10-CM | POA: Diagnosis present

## 2021-03-08 DIAGNOSIS — Z801 Family history of malignant neoplasm of trachea, bronchus and lung: Secondary | ICD-10-CM

## 2021-03-08 DIAGNOSIS — Z87440 Personal history of urinary (tract) infections: Secondary | ICD-10-CM | POA: Diagnosis not present

## 2021-03-08 DIAGNOSIS — S42201A Unspecified fracture of upper end of right humerus, initial encounter for closed fracture: Secondary | ICD-10-CM

## 2021-03-08 DIAGNOSIS — Z8249 Family history of ischemic heart disease and other diseases of the circulatory system: Secondary | ICD-10-CM

## 2021-03-08 DIAGNOSIS — I959 Hypotension, unspecified: Secondary | ICD-10-CM | POA: Diagnosis present

## 2021-03-08 DIAGNOSIS — Z7982 Long term (current) use of aspirin: Secondary | ICD-10-CM | POA: Diagnosis not present

## 2021-03-08 DIAGNOSIS — Z79890 Hormone replacement therapy (postmenopausal): Secondary | ICD-10-CM

## 2021-03-08 DIAGNOSIS — Z9109 Other allergy status, other than to drugs and biological substances: Secondary | ICD-10-CM

## 2021-03-08 DIAGNOSIS — I5031 Acute diastolic (congestive) heart failure: Secondary | ICD-10-CM | POA: Diagnosis present

## 2021-03-08 DIAGNOSIS — Z79899 Other long term (current) drug therapy: Secondary | ICD-10-CM

## 2021-03-08 DIAGNOSIS — W010XXA Fall on same level from slipping, tripping and stumbling without subsequent striking against object, initial encounter: Secondary | ICD-10-CM | POA: Diagnosis present

## 2021-03-08 LAB — CBC WITH DIFFERENTIAL/PLATELET
Abs Immature Granulocytes: 0.01 10*3/uL (ref 0.00–0.07)
Basophils Absolute: 0.1 10*3/uL (ref 0.0–0.1)
Basophils Relative: 2 %
Eosinophils Absolute: 0.1 10*3/uL (ref 0.0–0.5)
Eosinophils Relative: 1 %
HCT: 37.9 % (ref 36.0–46.0)
Hemoglobin: 11.7 g/dL — ABNORMAL LOW (ref 12.0–15.0)
Immature Granulocytes: 0 %
Lymphocytes Relative: 20 %
Lymphs Abs: 0.8 10*3/uL (ref 0.7–4.0)
MCH: 29.4 pg (ref 26.0–34.0)
MCHC: 30.9 g/dL (ref 30.0–36.0)
MCV: 95.2 fL (ref 80.0–100.0)
Monocytes Absolute: 0.3 10*3/uL (ref 0.1–1.0)
Monocytes Relative: 8 %
Neutro Abs: 2.9 10*3/uL (ref 1.7–7.7)
Neutrophils Relative %: 69 %
Platelets: 150 10*3/uL (ref 150–400)
RBC: 3.98 MIL/uL (ref 3.87–5.11)
RDW: 15.8 % — ABNORMAL HIGH (ref 11.5–15.5)
WBC: 4.2 10*3/uL (ref 4.0–10.5)
nRBC: 0 % (ref 0.0–0.2)

## 2021-03-08 LAB — BASIC METABOLIC PANEL
Anion gap: 7 (ref 5–15)
BUN: 15 mg/dL (ref 8–23)
CO2: 26 mmol/L (ref 22–32)
Calcium: 8.9 mg/dL (ref 8.9–10.3)
Chloride: 106 mmol/L (ref 98–111)
Creatinine, Ser: 0.9 mg/dL (ref 0.44–1.00)
GFR, Estimated: >60 mL/min (ref >60)
Glucose, Bld: 125 mg/dL — ABNORMAL HIGH (ref 70–99)
Potassium: 3.7 mmol/L (ref 3.5–5.1)
Sodium: 139 mmol/L (ref 135–145)

## 2021-03-08 LAB — TROPONIN I (HIGH SENSITIVITY)
Troponin I (High Sensitivity): 6 ng/L (ref <18)
Troponin I (High Sensitivity): 10 ng/L (ref <18)

## 2021-03-08 LAB — RESP PANEL BY RT-PCR (FLU A&B, COVID) ARPGX2
Influenza A by PCR: NEGATIVE
Influenza B by PCR: NEGATIVE
SARS Coronavirus 2 by RT PCR: NEGATIVE

## 2021-03-08 MED ORDER — METOPROLOL SUCCINATE ER 25 MG PO TB24
25.0000 mg | ORAL_TABLET | Freq: Every day | ORAL | Status: DC
  Administered 2021-03-11: 25 mg via ORAL
  Filled 2021-03-08 (×3): qty 1

## 2021-03-08 MED ORDER — FENTANYL CITRATE (PF) 100 MCG/2ML IJ SOLN
25.0000 ug | Freq: Once | INTRAMUSCULAR | Status: AC
  Administered 2021-03-08: 25 ug via INTRAVENOUS
  Filled 2021-03-08: qty 2

## 2021-03-08 MED ORDER — METHOCARBAMOL 500 MG PO TABS
500.0000 mg | ORAL_TABLET | Freq: Four times a day (QID) | ORAL | Status: DC | PRN
  Administered 2021-03-09 – 2021-03-11 (×5): 500 mg via ORAL
  Filled 2021-03-08 (×5): qty 1

## 2021-03-08 MED ORDER — AMLODIPINE BESYLATE 2.5 MG PO TABS
1.2500 mg | ORAL_TABLET | Freq: Every day | ORAL | Status: DC
  Administered 2021-03-11: 1.25 mg via ORAL
  Filled 2021-03-08 (×4): qty 0.5

## 2021-03-08 MED ORDER — HYDROCODONE-ACETAMINOPHEN 5-325 MG PO TABS
1.0000 | ORAL_TABLET | Freq: Four times a day (QID) | ORAL | Status: DC | PRN
  Administered 2021-03-09: 2 via ORAL
  Filled 2021-03-08: qty 2

## 2021-03-08 MED ORDER — FENTANYL CITRATE (PF) 100 MCG/2ML IJ SOLN
50.0000 ug | Freq: Once | INTRAMUSCULAR | Status: AC
  Administered 2021-03-08: 50 ug via INTRAVENOUS
  Filled 2021-03-08: qty 2

## 2021-03-08 MED ORDER — ACETAMINOPHEN 325 MG PO TABS
650.0000 mg | ORAL_TABLET | Freq: Once | ORAL | Status: AC
  Administered 2021-03-08: 650 mg via ORAL
  Filled 2021-03-08: qty 2

## 2021-03-08 MED ORDER — LEVOTHYROXINE SODIUM 50 MCG PO TABS: 75.0000 ug | ORAL_TABLET | Freq: Every day | ORAL | Status: DC

## 2021-03-08 MED ORDER — MORPHINE SULFATE (PF) 2 MG/ML IV SOLN
2.0000 mg | INTRAVENOUS | Status: DC | PRN
  Administered 2021-03-08 – 2021-03-09 (×3): 2 mg via INTRAVENOUS
  Filled 2021-03-08 (×3): qty 1

## 2021-03-08 MED ORDER — LEVOTHYROXINE SODIUM 75 MCG PO TABS
75.0000 ug | ORAL_TABLET | Freq: Every day | ORAL | Status: DC
  Administered 2021-03-09 – 2021-03-11 (×3): 75 ug via ORAL
  Filled 2021-03-08 (×3): qty 1

## 2021-03-08 MED ORDER — METHOCARBAMOL 1000 MG/10ML IJ SOLN
500.0000 mg | Freq: Four times a day (QID) | INTRAVENOUS | Status: DC | PRN
  Administered 2021-03-09: 500 mg via INTRAVENOUS
  Filled 2021-03-08: qty 5
  Filled 2021-03-08: qty 500

## 2021-03-08 MED ORDER — FENTANYL CITRATE (PF) 100 MCG/2ML IJ SOLN: 50.0000 ug | INTRAMUSCULAR | Status: DC | PRN

## 2021-03-08 MED ORDER — ROSUVASTATIN CALCIUM 10 MG PO TABS
40.0000 mg | ORAL_TABLET | Freq: Every day | ORAL | Status: DC
  Administered 2021-03-09 – 2021-03-10 (×3): 40 mg via ORAL
  Filled 2021-03-08 (×3): qty 4
  Filled 2021-03-08: qty 2

## 2021-03-08 NOTE — ED Notes (Signed)
Dr. Roel Cluck notified that pt needs pain medication

## 2021-03-08 NOTE — ED Provider Notes (Signed)
[facility] DEPT Provider Note   CSN: [id number] Arrival date & time: 03/08/21  1437     History Chief Complaint  Patient presents with   Lytle Michaels    Jasmin Crane is a 87 y.o. female with a past medical history of hypertension, hyperlipidemia presenting to the ED with a chief complaint of fall.  Patient had a mechanical fall just prior to arrival.  States that she was on her way to the bathroom when she felt like she tripped over something.  She landed on her right arm and has been having pain since then.  Denies any head injury or loss of consciousness.  Takes a baby aspirin daily but denies any anticoagulant use.  Denies headache, blurry vision or vomiting.  Is mostly having pain in her right arm.  Denies any numbness or weakness.  Believes she may have bruised her left knee or shin as well.  Has not ambulated since the fall happened.  She resides at home by herself.  Denies any neck pain, back pain, hip pain, lightheadedness or dizziness.  HPI     Past Medical History:  Diagnosis Date   Arrhythmia    Cancer ([place])    skin cancer 2005   Carotid artery occlusion    Claudication of lower extremity ([place])    Coronary artery disease    Diverticulitis    Hyperlipidemia    Hypertension    Hypothyroidism    Myopathy    Paralysis of tongue    Sick sinus syndrome [facility])     Patient Active Problem List   Diagnosis Date Noted   Acute respiratory failure with hypoxia ([place]) 03/08/2021   Educated about COVID-19 virus infection 05/30/2020   Dyslipidemia 05/28/2019   Medication management 06/10/2018   Other fatigue 06/10/2018   PVD (peripheral vascular disease) ([place]) 06/10/2018   Right lower quadrant abdominal pain 06/10/2018   AAA (abdominal aortic aneurysm) without rupture ([place]) 06/10/2018   Aortic valve regurgitation 06/10/2018   Carotid artery disease ([place]) 03/06/2017   Claudication in peripheral vascular disease ([place]) 03/06/2017   Essential  hypertension 03/06/2017   Hyperlipidemia 03/06/2017   Coronary artery disease due to lipid rich plaque 03/06/2017    Past Surgical History:  Procedure Laterality Date   APPENDECTOMY     CAROTID ENDARTERECTOMY     CATARACT EXTRACTION, BILATERAL     CESAREAN SECTION     COLONOSCOPY     CORONARY ANGIOPLASTY     CORONARY STENT PLACEMENT     right coronary artery 1998   INGUINAL HERNIA REPAIR Right 02/25/2019   Procedure: RIGHT INGUINAL HERNIA REPAIR WITH MESH;  Surgeon: Donnie Mesa, MD;  Location: [facility];  Service: General;  Laterality: Right;  LMA AND TAP BLOCK   MULTIPLE TOOTH EXTRACTIONS     SALIVARY GLAND SURGERY     removed 1998   SALPINGECTOMY     SKIN CANCER EXCISION     2005     OB History   No obstetric history on file.     Family History  Problem Relation Age of Onset   Cancer Mother    Stroke Mother    Heart disease Father 60       CAD   Heart failure Sister    Arthritis Brother    Lung cancer Brother    Pancreatic cancer Brother    Heart attack Son 57    Social History   Tobacco Use   Smoking status: Former    Pack years: 0.00  Types: Cigarettes   Smokeless tobacco: Never   Tobacco comments:    Quit tobacco around 1975  Vaping Use   Vaping Use: Never used  Substance Use Topics   Alcohol use: Yes    Comment: rare cocktail   Drug use: Never    Home Medications Prior to Admission medications   Medication Sig Start Date End Date Taking? Authorizing Provider  amLODipine (NORVASC) 2.5 MG tablet Take 1.25 mg by mouth daily.     [provider]  aspirin EC 81 MG tablet Take 81 mg by mouth daily.    [provider]  Clobetasol Prop Emollient Base (CLOBETASOL PROPIONATE E) 0.05 % emollient cream Apply 1 application topically 2 (two) times daily. Not for face,folds or groin 05/20/20   Sigal-Burning, Anderson Malta, PA-C  levothyroxine (SYNTHROID, LEVOTHROID) 75 MCG tablet Take 75 mcg by mouth daily before breakfast.  11/05/18   [provider]  metoprolol succinate (TOPROL-XL) 25 MG 24 hr tablet TAKE 1 TABLET BY MOUTH EVERY DAY 08/02/20   Minus Breeding, MD  rosuvastatin (CRESTOR) 40 MG tablet TAKE 1 TABLET BY MOUTH AT BEDTIME 01/11/21   Minus Breeding, MD  vitamin C (ASCORBIC ACID) 500 MG tablet Take 500 mg by mouth daily.    [provider]    Allergies    Cozaar [losartan], Pletaal [cilostazol], and Scallops [shellfish allergy]  Review of Systems   Review of Systems  Constitutional:  Negative for appetite change, chills and fever.  HENT:  Negative for ear pain, rhinorrhea, sneezing and sore throat.   Eyes:  Negative for photophobia and visual disturbance.  Respiratory:  Negative for cough, chest tightness, shortness of breath and wheezing.   Cardiovascular:  Negative for chest pain and palpitations.  Gastrointestinal:  Negative for abdominal pain, blood in stool, constipation, diarrhea, nausea and vomiting.  Genitourinary:  Negative for dysuria, hematuria and urgency.  Musculoskeletal:  Positive for arthralgias and myalgias.  Skin:  Negative for rash.  Neurological:  Negative for dizziness, weakness and light-headedness.   Physical Exam Updated Vital Signs BP (!) 147/70   Pulse 60   Temp 98.1 F (36.7 C) (Oral)   Resp 17   Ht 5\' 2"  (1.575 m)   Wt 46.7 kg   SpO2 97%   BMI 18.84 kg/m   Physical Exam Vitals and nursing note reviewed.  Constitutional:      General: She is not in acute distress.    Appearance: She is well-developed.  HENT:     Head: Normocephalic and atraumatic.     Nose: Nose normal.  Eyes:     General: No scleral icterus.       Right eye: No discharge.        Left eye: No discharge.     Conjunctiva/sclera: Conjunctivae normal.  Cardiovascular:     Rate and Rhythm: Normal rate and regular rhythm.     Heart sounds: Normal heart sounds. No murmur heard.   No friction rub. No gallop.  Pulmonary:     Effort: Pulmonary effort is normal. No respiratory distress.      Breath sounds: Normal breath sounds.  Abdominal:     General: Bowel sounds are normal. There is no distension.     Palpations: Abdomen is soft.     Tenderness: There is no abdominal tenderness. There is no guarding.     Comments: Abdomen is soft and nontender.  Musculoskeletal:        General: Swelling (Left knee, no changes to range of motion)  and tenderness present.     Cervical back: Normal range of motion and neck supple.     Comments: Patient reports pain with palpation throughout the entire right arm.  Limited range of motion secondary to pain.  2+ radial pulse palpated bilaterally.  No deformities.  No C-spine tenderness at the midline. Point tenderness at the base of the fifth metatarsal of the right foot.  2+ DP pulse palpated bilaterally  Skin:    General: Skin is warm and dry.     Findings: No rash.  Neurological:     Mental Status: She is alert.     Motor: No abnormal muscle tone.     Coordination: Coordination normal.     Comments: No facial asymmetry.    ED Results / Procedures / Treatments   Labs (all labs ordered are listed, but only abnormal results are displayed) Labs Reviewed  BASIC METABOLIC PANEL - Abnormal; Notable for the following components:      Result Value   Glucose, Bld 125 (*)    All other components within normal limits  CBC WITH DIFFERENTIAL/PLATELET - Abnormal; Notable for the following components:   Hemoglobin 11.7 (*)    RDW 15.8 (*)    All other components within normal limits  RESP PANEL BY RT-PCR (FLU A&B, COVID) ARPGX2  TROPONIN I (HIGH SENSITIVITY)  TROPONIN I (HIGH SENSITIVITY)    EKG EKG Interpretation  Date/Time:  Tuesday March 08 2021 20:21:41 EDT Ventricular Rate:  62 PR Interval:  154 QRS Duration: 88 QT Interval:  475 QTC Calculation: 483 R Axis:   72 Text Interpretation: Sinus rhythm Probable left ventricular hypertrophy Anterior Q waves, possibly due to LVH normal axis No acute ischemia Confirmed by Lorre Munroe ([id number]) on  03/08/2021 8:39:57 PM  Radiology DG Chest 2 View  Result Date: 03/08/2021 CLINICAL DATA:  Status post fall. EXAM: CHEST - 2 VIEW COMPARISON:  None. FINDINGS: There is coarsening of the pulmonary interstitium. Lungs are clear. Cardiomegaly. No pneumothorax or pleural fluid. Aortic atherosclerosis. No acute or focal bony abnormality. IMPRESSION: No acute disease. Cardiomegaly. Aortic Atherosclerosis (ICD10-I70.0). Electronically Signed   By: Inge Rise M.D.   On: 03/08/2021 18:55   DG Shoulder Right  Result Date: 03/08/2021 CLINICAL DATA:  Mechanical fall EXAM: RIGHT SHOULDER - 2+ VIEW COMPARISON:  None. FINDINGS: Mildly impacted comminuted fracture of the surgical neck of the humerus with intra-articular extension. With associated soft tissue swelling. Diffuse demineralization of bone. Chronic lung changes. IMPRESSION: Mildly impacted comminuted fracture of the proximal humerus with intra-articular extension. Electronically Signed   By: Dahlia Bailiff MD   On: 03/08/2021 18:52   DG Elbow Complete Right  Result Date: 03/08/2021 CLINICAL DATA:  Pain after mechanical fall EXAM: RIGHT ELBOW - COMPLETE 3+ VIEW COMPARISON:  None. FINDINGS: There is no evidence of fracture, dislocation, or joint effusion. There is no evidence of arthropathy. Diffuse demineralization of bone. Soft tissues are unremarkable. IMPRESSION: No fracture or dislocation of the right elbow. No elbow joint effusion. Electronically Signed   By: Dahlia Bailiff MD   On: 03/08/2021 18:52   DG Wrist Complete Right  Result Date: 03/08/2021 CLINICAL DATA:  Pain after mechanical fall EXAM: RIGHT WRIST - COMPLETE 3+ VIEW COMPARISON:  None. FINDINGS: There is no evidence of fracture or dislocation. There is no evidence of significant arthropathy. Diffuse demineralization of bone. Soft tissues are unremarkable. IMPRESSION: No fracture or dislocation of the right wrist. Electronically Signed   By: Dahlia Bailiff MD  On: 03/08/2021 18:54    DG Tibia/Fibula Left  Result Date: 03/08/2021 CLINICAL DATA:  Recent fall with left lower leg pain, initial encounter EXAM: LEFT TIBIA AND FIBULA - 2 VIEW COMPARISON:  None. FINDINGS: There is no evidence of fracture or other focal bone lesions. Soft tissues are unremarkable. IMPRESSION: No acute abnormality noted. Electronically Signed   By: Inez Catalina M.D.   On: 03/08/2021 18:56   CT Shoulder Right Wo Contrast  Result Date: 03/08/2021 CLINICAL DATA:  Known proximal right humeral fracture EXAM: CT OF THE UPPER RIGHT EXTREMITY WITHOUT CONTRAST TECHNIQUE: Multidetector CT imaging of the upper right extremity was performed according to the standard protocol. COMPARISON:  Plain film from earlier in the same day. FINDINGS: Bones/Joint/Cartilage Degenerative changes of the cervical and thoracic spine are noted. Visualized ribcage demonstrates some old healed rib fractures on the right. The scapula and clavicle are within normal limits. The proximal humerus demonstrates a comminuted fracture involving primarily the surgical neck with impaction at the fracture site. No dislocation is seen. No other focal bony abnormality is noted. Ligaments Suboptimally assessed by CT. Muscles and Tendons Surrounding musculature appears within normal limits. Rotator cuff as visualized appears intact. Soft tissues Lungs demonstrate evidence of a right-sided pleural effusion and mild emphysematous changes. Surrounding soft tissue structures show no acute abnormality. IMPRESSION: Comminuted fracture involving primarily the surgical neck of the proximal right humerus. Some impaction at the fracture site is noted. Right-sided pleural effusion and emphysematous changes. Electronically Signed   By: Inez Catalina M.D.   On: 03/08/2021 22:18   DG Knee Complete 4 Views Left  Result Date: 03/08/2021 CLINICAL DATA:  Recent fall with left knee pain, initial encounter EXAM: LEFT KNEE - COMPLETE 4+ VIEW COMPARISON:  None. FINDINGS: No  evidence of fracture, dislocation, or joint effusion. No evidence of arthropathy or other focal bone abnormality. Soft tissues are unremarkable. IMPRESSION: No acute abnormality noted. Electronically Signed   By: Inez Catalina M.D.   On: 03/08/2021 18:55   DG Hand Complete Right  Result Date: 03/08/2021 CLINICAL DATA:  Pain after fall EXAM: RIGHT HAND - COMPLETE 3+ VIEW COMPARISON:  None. FINDINGS: There is no evidence of fracture or dislocation. There is no evidence of arthropathy. Irregular shortening of the fifth metacarpal, favor congenital versus sequela prior trauma. Diffuse demineralization of bone. Soft tissues are unremarkable. IMPRESSION: No acute osseous abnormality in the right hand. Electronically Signed   By: Dahlia Bailiff MD   On: 03/08/2021 18:56   DG Foot Complete Right  Result Date: 03/08/2021 CLINICAL DATA:  87 year old female with fall and right foot pain. EXAM: RIGHT FOOT COMPLETE - 3+ VIEW COMPARISON:  None. FINDINGS: There is a minimally displaced avulsion fracture of the base of the fifth metatarsal. No other acute fracture identified. The bones are osteopenic. There is no dislocation. Mild soft tissue swelling of the dorsum of the foot. No radiopaque foreign object or soft tissue gas. IMPRESSION: Avulsion fracture of the base of the fifth metatarsal. Electronically Signed   By: Anner Crete M.D.   On: 03/08/2021 20:31    Procedures Procedures   Medications Ordered in ED Medications  fentaNYL (SUBLIMAZE) injection 25 mcg (25 mcg Intravenous Given 03/08/21 1632)  acetaminophen (TYLENOL) tablet 650 mg (650 mg Oral Given 03/08/21 1631)  fentaNYL (SUBLIMAZE) injection 50 mcg (50 mcg Intravenous Given 03/08/21 1831)  fentaNYL (SUBLIMAZE) injection 25 mcg (25 mcg Intravenous Given 03/08/21 2101)    ED Course  I have reviewed the triage vital  signs and the nursing notes.  Pertinent labs & imaging results that were available during my care of the patient were reviewed by me  and considered in my medical decision making (see chart for details).  Clinical Course as of 03/08/21 2229  Tue Mar 08, 2021  1855 DG Elbow Complete Right Negative [HK]  1855 DG Shoulder Right Proximal humerus fracture that extends intra-articular. [HK]  1910 SpO2: 96 % Patient on 2L via [place]. No complaints of SOB, cough, chest pain. No abnormalities noted on chest x-ray and no history of chronic lung disease or supplemental oxygen use at home. [HK]  1911 O2 sat was 87% when supplemental oxygen removed. [HK]  2048 Hemoglobin(!): 11.7 [HK]  2103 Troponin I (High Sensitivity): 6 [HK]  2103 Sodium: 139 [HK]  2103 Potassium: 3.7 [HK]    Clinical Course User Index [HK] Delia Heady, PA-C   MDM Rules/Calculators/A&P                          87 year old female presenting to the ED after mechanical fall at home.  She resides at home by herself.  Believes that she tripped on something on the ground on her way to the bathroom.  Landed on her right arm.  Reports right arm pain and bruising on her left knee.  Has not ambulated since the fall.  She is unable to pinpoint where exactly she is having pain in her right arm but states that the whole arm is hurting and has limited range of motion.  No obvious deformities on exam.  Equal and intact distal pulses bilaterally.  She has edema noted of the left knee but normal range of motion. Will obtain x-rays and reassess.  Patient on supplemental oxygen here.  Right upper extremity x-rays show proximal humerus fracture that extends intra-articularly.  X-ray of the left knee and tib-fib without any abnormalities.  X-ray of the right foot does show a fracture at the base of the fifth metatarsal.    Per Dr. Lyla Glassing recommendations will place in a sling, order CT of the shoulder and have her follow-up with Dr. Stann Mainland in the office in 1 week.  Regarding her foot fracture will place in a postop shoe and weight-bear as tolerated.    EKG shows sinus rhythm, no acute  ischemia.  BMP, CBC unremarkable.  Troponin is negative x1.  Patient remains hypoxic.  Chest x-ray shows coarsening of pulmonary and still suspicion.  This could be just chronic lung disease.  She is complaining of "coughing up some mucus" but this has been chronic for several weeks and unchanged for her.  She denies shortness of breath.  She may very well need supplemental oxygen at baseline based on her work-up.  I feel that she will benefit from observation regarding her hypoxia and for further pain control.  Certainly pain medication here could have worsened her hypoxia.  Patient and daughter are agreeable to the plan.    Portions of this note were generated with [employer]. Dictation errors may occur despite best attempts at proofreading.  Final Clinical Impression(s) / ED Diagnoses Final diagnoses:  Fall in home, initial encounter  Closed fracture of proximal end of right humerus, unspecified fracture morphology, initial encounter  Hypoxia  Closed fracture of right foot, initial encounter    Rx / DC Orders ED Discharge Orders     None        Delia Heady, PA-C 03/08/21 2229  Arnaldo Natal, MD 03/08/21 2253

## 2021-03-08 NOTE — Progress Notes (Signed)
Orthopedic Tech Progress Note Patient Details:  Jasmin Crane November 14, 1931 [id number]  Ortho Devices Type of Ortho Device: Arm sling Ortho Device/Splint Location: Right Arm Ortho Device/Splint Interventions: Application   Post Interventions Patient Tolerated: Well  Linus Salmons [name] 03/08/2021, 9:11 PM

## 2021-03-08 NOTE — ED Notes (Signed)
Ortho tech gathering supplies for shoulder immobilizer placement

## 2021-03-08 NOTE — ED Notes (Signed)
Hina, PA notified that pt is requesting pain medication

## 2021-03-08 NOTE — ED Triage Notes (Signed)
Patient arrives via EMS from her home after a mechanical fall.  Patient denies hitting her head, denies being on blood thinners.  The patient co having left knee pain with a hematoma noted, she also co having right shoulder pain.  Patient answers all questions appropriately.

## 2021-03-08 NOTE — ED Notes (Signed)
Lab called to see if blood work was sent when IV was initiated. Lab has pt blood work

## 2021-03-08 NOTE — ED Notes (Signed)
Pt ot CT via stretcher

## 2021-03-08 NOTE — ED Notes (Signed)
Dr. Doutova at bedside.  

## 2021-03-08 NOTE — H&P (Signed)
Jasmin Crane NLG:[id number] DOB: [birth year] DOA: 03/08/2021     PCP: Hayden Rasmussen, MD   Outpatient Specialists:   CARDS:   Dr. Delma Freeze [facility]   GI  Dr.  Demetrius [facility]    Patient arrived to ER on 03/08/21 at 1437 Referred by Attending Arnaldo Natal, MD   Patient coming from: home Lives alone,     Chief Complaint:   Chief Complaint  Patient presents with   Fall    HPI: Jasmin Crane is a 87 y.o. female with medical history significant of HTN, HLD, AAA, carotid artery occlusion s/p repair (1996) dementia on Aricept  Hypothyroidims She has some moderately had normal ABIs left greater than right.  Presented with   fall, she was trying to get up to use the bathroom and fell think she probably tripped or slid she hit her right side shoulder and foot could not bear weight thereafter did not hit her head no LOC no headache no blurry vision no neck pain Right foot pain She was just seen by GI yesterday for problems with alternating diarrhea and constipation.  Was started on bowel regimen. She used to smoke as a young lady.  And lived with a smoker all her life as well as for her family used to smoke. She denies wheezing But has been having mild cough productive of whitish sputum for few weeks now.  Has   been vaccinated against COVID and boosted   Initial COVID TEST  NEGATIVE   Lab Results  Component Value Date   [place] NEGATIVE 03/08/2021   [place] NEGATIVE 02/21/2019   Regarding pertinent Chronic problems:   Hyperlipidemia -  on statins Crestor Lipid Panel     Component Value Date/Time   CHOL 185 06/11/2018 1026   TRIG 77 06/11/2018 1026   HDL 88 06/11/2018 1026   CHOLHDL 2.1 06/11/2018 1026   CHOLHDL 2.0 09/01/2016 1226   VLDL 15 09/01/2016 1226   LDLCALC 82 06/11/2018 1026   LABVLDL 15 06/11/2018 1026    HTN on metoprolol Norvasc    CAD  - On Aspirin, statin, betablocker,                 -  followed by cardiology                      Hypothyroidism:  Lab Results  Component Value Date   TSH 0.024 (L) 06/11/2018   on synthroid       Dementia - on Aricept     While in ER: Extensive imaging was done showing proximal humerus fracture Also noted to have fifth metatarsal fracture on the right. Incidentally was found to be satting 87% on room air started on 2 L Orthopedics Dr. Lyla Glassing was consulted and recommended placing sling   order CT of the shoulder and have her follow-up with Dr. Stann Mainland in the office in 1 week.  Regarding her foot fracture will place in a postop shoe and weight-bear as tolerated.    ED Triage Vitals  Enc Vitals Group     BP 03/08/21 1439 (!) 174/70     Pulse Rate 03/08/21 1439 67     Resp 03/08/21 1439 18     Temp 03/08/21 1439 98.1 F (36.7 C)     Temp Source 03/08/21 1439 Oral     SpO2 03/08/21 1439 (!) 87 %     Weight 03/08/21 1448 103 lb (46.7 kg)     Height 03/08/21 1448  5\' 2"  (1.575 m)     Head Circumference --      Peak Flow --      Pain Score 03/08/21 1448 10     Pain Loc --      Pain Edu? --      Excl. in [place]? --   TMAX(24)@     _________________________________________ Significant initial  Findings: Abnormal Labs Reviewed  BASIC METABOLIC PANEL - Abnormal; Notable for the following components:      Result Value   Glucose, Bld 125 (*)    All other components within normal limits  CBC WITH DIFFERENTIAL/PLATELET - Abnormal; Notable for the following components:   Hemoglobin 11.7 (*)    RDW 15.8 (*)    All other components within normal limits   ____________________________________________ Ordered    CXR - cardiomegally  CT shoulder showed Comminuted fracture involving primarily the surgical neck of the proximal right humerus. Some impaction at the fracture site is noted.   Right-sided pleural effusion and emphysematous changes.   _________________________   ECG: Ordered Personally reviewed by me showing: HR : 62  no evidence of ischemic changes QTC 483   The  recent clinical data is shown below. Vitals:   03/08/21 2000 03/08/21 2045 03/08/21 2100 03/08/21 2115  BP: (!) 156/68 133/80 (!) 150/66 (!) 145/87  Pulse: 63 70 62 65  Resp: 16 14 15 17   Temp:      TempSrc:      SpO2: 97% 97% 99% 97%  Weight:      Height:        WBC     Component Value Date/Time   WBC 4.2 03/08/2021 1930   LYMPHSABS 0.8 03/08/2021 1930   MONOABS 0.3 03/08/2021 1930   EOSABS 0.1 03/08/2021 1930   BASOSABS 0.1 03/08/2021 1930       UA not ordered    Results for orders placed or performed during the hospital encounter of 03/08/21  Resp Panel by RT-PCR (Flu A&B, Covid) Nasopharyngeal Swab     Status: None   Collection Time: 03/08/21  9:51 PM   Specimen: Nasopharyngeal Swab; Nasopharyngeal(NP) swabs in vial transport medium  Result Value Ref Range Status   SARS Coronavirus 2 by RT PCR NEGATIVE NEGATIVE Final    Comment: (NOTE) SARS-CoV-2 target nucleic acids are NOT DETECTED.  The SARS-CoV-2 RNA is generally detectable in upper respiratory specimens during the acute phase of infection. The lowest concentration of SARS-CoV-2 viral copies this assay can detect is 138 copies/mL. A negative result does not preclude SARS-Cov-2 infection and should not be used as the sole basis for treatment or other patient management decisions. A negative result may occur with  improper specimen collection/handling, submission of specimen other than nasopharyngeal swab, presence of viral mutation(s) within the areas targeted by this assay, and inadequate number of viral copies(<138 copies/mL). A negative result must be combined with clinical observations, patient history, and epidemiological information. The expected result is Negative.  Fact Sheet for Patients:  EntrepreneurPulse.com.au  Fact Sheet for Healthcare Providers:  IncredibleEmployment.be  This test is no t yet approved or cleared by the [place] FDA and  has been  authorized for detection and/or diagnosis of SARS-CoV-2 by FDA under an Emergency Use Authorization (EUA). This EUA will remain  in effect (meaning this test can be used) for the duration of the COVID-19 declaration under Section 564(b)(1) of the Act, 21 U.S.C.section 360bbb-3(b)(1), unless the authorization is terminated  or revoked sooner.  Influenza A by PCR NEGATIVE NEGATIVE Final   Influenza B by PCR NEGATIVE NEGATIVE Final    Comment: (NOTE) The Xpert Xpress SARS-CoV-2/FLU/RSV plus assay is intended as an aid in the diagnosis of influenza from Nasopharyngeal swab specimens and should not be used as a sole basis for treatment. Nasal washings and aspirates are unacceptable for Xpert Xpress SARS-CoV-2/FLU/RSV testing.  Fact Sheet for Patients: EntrepreneurPulse.com.au  Fact Sheet for Healthcare Providers: IncredibleEmployment.be  This test is not yet approved or cleared by the [place] FDA and has been authorized for detection and/or diagnosis of SARS-CoV-2 by FDA under an Emergency Use Authorization (EUA). This EUA will remain in effect (meaning this test can be used) for the duration of the COVID-19 declaration under Section 564(b)(1) of the Act, 21 U.S.C. section 360bbb-3(b)(1), unless the authorization is terminated or revoked.  Performed at [facility], [place] [address], [place], [place] [address]      _______________________________________________________ ER Provider Called:     Dr. Delfino Lovett They Recommend admit to medicine follow up with Stann Mainland   _______________________________________________ Hospitalist was called for admission for acute hypoxic respiratory failure  The following Work up has been ordered so far:  Orders Placed This Encounter  Procedures   Resp Panel by RT-PCR (Flu A&B, Covid) Nasopharyngeal Swab   DG Chest 2 View   DG Shoulder Right   DG Elbow Complete Right   DG Wrist  Complete Right   DG Hand Complete Right   DG Knee Complete 4 Views Left   DG Tibia/Fibula Left   DG Foot Complete Right   CT Shoulder Right Wo Contrast   Basic metabolic panel   CBC with Differential   Apply shoulder immobilizer/sling   Apply Post op shoe   Consult to orthopedic surgery   Consult to hospitalist   ED EKG     Following Medications were ordered in ER: Medications  fentaNYL (SUBLIMAZE) injection 25 mcg (25 mcg Intravenous Given 03/08/21 1632)  acetaminophen (TYLENOL) tablet 650 mg (650 mg Oral Given 03/08/21 1631)  fentaNYL (SUBLIMAZE) injection 50 mcg (50 mcg Intravenous Given 03/08/21 1831)  fentaNYL (SUBLIMAZE) injection 25 mcg (25 mcg Intravenous Given 03/08/21 2101)        Consult Orders  (From admission, onward)           Start     Ordered   03/08/21 2128  Consult to hospitalist  Once       Provider:  (Not yet assigned)  Question Answer Comment  Place call to: [facility]   Reason for Consult Admit      03/08/21 2127              OTHER Significant initial  Findings:  labs showing:    Recent Labs  Lab 03/08/21 1930  NA 139  K 3.7  CO2 26  GLUCOSE 125*  BUN 15  CREATININE 0.90  CALCIUM 8.9    Cr  * stable,  Up from baseline see below Lab Results  Component Value Date   CREATININE 0.90 03/08/2021   CREATININE 0.92 04/17/2020   CREATININE 0.89 02/21/2019    No results for input(s): AST, ALT, ALKPHOS, BILITOT, PROT, ALBUMIN in the last 168 hours. Lab Results  Component Value Date   CALCIUM 8.9 03/08/2021          Plt: Lab Results  Component Value Date   PLT 150 03/08/2021         Recent Labs  Lab 03/08/21 1930  WBC 4.2  NEUTROABS 2.9  HGB 11.7*  HCT 37.9  MCV 95.2  PLT 150    HG/HCT * stable,  Down *Up from baseline see below    Component Value Date/Time   HGB 11.7 (L) 03/08/2021 1930   HGB 13.5 06/11/2018 1026   HCT 37.9 03/08/2021 1930   HCT 42.6 06/11/2018 1026   MCV 95.2 03/08/2021 1930    MCV 90 06/11/2018 1026            Cultures: No results found for: SDES, SPECREQUEST, CULT, REPTSTATUS   Radiological Exams on Admission: DG Chest 2 View  Result Date: 03/08/2021 CLINICAL DATA:  Status post fall. EXAM: CHEST - 2 VIEW COMPARISON:  None. FINDINGS: There is coarsening of the pulmonary interstitium. Lungs are clear. Cardiomegaly. No pneumothorax or pleural fluid. Aortic atherosclerosis. No acute or focal bony abnormality. IMPRESSION: No acute disease. Cardiomegaly. Aortic Atherosclerosis (ICD10-I70.0). Electronically Signed   By: Inge Rise M.D.   On: 03/08/2021 18:55   DG Shoulder Right  Result Date: 03/08/2021 CLINICAL DATA:  Mechanical fall EXAM: RIGHT SHOULDER - 2+ VIEW COMPARISON:  None. FINDINGS: Mildly impacted comminuted fracture of the surgical neck of the humerus with intra-articular extension. With associated soft tissue swelling. Diffuse demineralization of bone. Chronic lung changes. IMPRESSION: Mildly impacted comminuted fracture of the proximal humerus with intra-articular extension. Electronically Signed   By: Dahlia Bailiff MD   On: 03/08/2021 18:52   DG Elbow Complete Right  Result Date: 03/08/2021 CLINICAL DATA:  Pain after mechanical fall EXAM: RIGHT ELBOW - COMPLETE 3+ VIEW COMPARISON:  None. FINDINGS: There is no evidence of fracture, dislocation, or joint effusion. There is no evidence of arthropathy. Diffuse demineralization of bone. Soft tissues are unremarkable. IMPRESSION: No fracture or dislocation of the right elbow. No elbow joint effusion. Electronically Signed   By: Dahlia Bailiff MD   On: 03/08/2021 18:52   DG Wrist Complete Right  Result Date: 03/08/2021 CLINICAL DATA:  Pain after mechanical fall EXAM: RIGHT WRIST - COMPLETE 3+ VIEW COMPARISON:  None. FINDINGS: There is no evidence of fracture or dislocation. There is no evidence of significant arthropathy. Diffuse demineralization of bone. Soft tissues are unremarkable. IMPRESSION: No  fracture or dislocation of the right wrist. Electronically Signed   By: Dahlia Bailiff MD   On: 03/08/2021 18:54   DG Tibia/Fibula Left  Result Date: 03/08/2021 CLINICAL DATA:  Recent fall with left lower leg pain, initial encounter EXAM: LEFT TIBIA AND FIBULA - 2 VIEW COMPARISON:  None. FINDINGS: There is no evidence of fracture or other focal bone lesions. Soft tissues are unremarkable. IMPRESSION: No acute abnormality noted. Electronically Signed   By: Inez Catalina M.D.   On: 03/08/2021 18:56   CT Shoulder Right Wo Contrast  Result Date: 03/08/2021 CLINICAL DATA:  Known proximal right humeral fracture EXAM: CT OF THE UPPER RIGHT EXTREMITY WITHOUT CONTRAST TECHNIQUE: Multidetector CT imaging of the upper right extremity was performed according to the standard protocol. COMPARISON:  Plain film from earlier in the same day. FINDINGS: Bones/Joint/Cartilage Degenerative changes of the cervical and thoracic spine are noted. Visualized ribcage demonstrates some old healed rib fractures on the right. The scapula and clavicle are within normal limits. The proximal humerus demonstrates a comminuted fracture involving primarily the surgical neck with impaction at the fracture site. No dislocation is seen. No other focal bony abnormality is noted. Ligaments Suboptimally assessed by CT. Muscles and Tendons Surrounding musculature appears within normal limits. Rotator cuff as visualized appears intact. Soft tissues Lungs  demonstrate evidence of a right-sided pleural effusion and mild emphysematous changes. Surrounding soft tissue structures show no acute abnormality. IMPRESSION: Comminuted fracture involving primarily the surgical neck of the proximal right humerus. Some impaction at the fracture site is noted. Right-sided pleural effusion and emphysematous changes. Electronically Signed   By: Inez Catalina M.D.   On: 03/08/2021 22:18   DG Knee Complete 4 Views Left  Result Date: 03/08/2021 CLINICAL DATA:  Recent  fall with left knee pain, initial encounter EXAM: LEFT KNEE - COMPLETE 4+ VIEW COMPARISON:  None. FINDINGS: No evidence of fracture, dislocation, or joint effusion. No evidence of arthropathy or other focal bone abnormality. Soft tissues are unremarkable. IMPRESSION: No acute abnormality noted. Electronically Signed   By: Inez Catalina M.D.   On: 03/08/2021 18:55   DG Hand Complete Right  Result Date: 03/08/2021 CLINICAL DATA:  Pain after fall EXAM: RIGHT HAND - COMPLETE 3+ VIEW COMPARISON:  None. FINDINGS: There is no evidence of fracture or dislocation. There is no evidence of arthropathy. Irregular shortening of the fifth metacarpal, favor congenital versus sequela prior trauma. Diffuse demineralization of bone. Soft tissues are unremarkable. IMPRESSION: No acute osseous abnormality in the right hand. Electronically Signed   By: Dahlia Bailiff MD   On: 03/08/2021 18:56   DG Foot Complete Right  Result Date: 03/08/2021 CLINICAL DATA:  87 year old female with fall and right foot pain. EXAM: RIGHT FOOT COMPLETE - 3+ VIEW COMPARISON:  None. FINDINGS: There is a minimally displaced avulsion fracture of the base of the fifth metatarsal. No other acute fracture identified. The bones are osteopenic. There is no dislocation. Mild soft tissue swelling of the dorsum of the foot. No radiopaque foreign object or soft tissue gas. IMPRESSION: Avulsion fracture of the base of the fifth metatarsal. Electronically Signed   By: Anner Crete M.D.   On: 03/08/2021 20:31   _______________________________________________________________________________________________________ Latest  Blood pressure (!) 145/87, pulse 65, temperature 98.1 F (36.7 C), temperature source Oral, resp. rate 17, height 5\' 2"  (1.575 m), weight 46.7 kg, SpO2 97 %.   Review of Systems:    Pertinent positives include:  fatigue,productive cough, pain  Constitutional:  No weight loss, night sweats, Fevers, chills, weight loss  HEENT:  No  headaches, Difficulty swallowing,Tooth/dental problems,Sore throat,  No sneezing, itching, ear ache, nasal congestion, post nasal drip,  Cardio-vascular:  No chest pain, Orthopnea, PND, anasarca, dizziness, palpitations.no Bilateral lower extremity swelling  GI:  No heartburn, indigestion, abdominal pain, nausea, vomiting, diarrhea, change in bowel habits, loss of appetite, melena, blood in stool, hematemesis Resp:  no shortness of breath at rest. No dyspnea on exertion, No excess mucus, no No non-productive cough, No coughing up of blood.No change in color of mucus.No wheezing. Skin:  no rash or lesions. No jaundice GU:  no dysuria, change in color of urine, no urgency or frequency. No straining to urinate.  No flank pain.  Musculoskeletal:  No joint pain or no joint swelling. No decreased range of motion. No back pain.  Psych:  No change in mood or affect. No depression or anxiety. No memory loss.  Neuro: no localizing neurological complaints, no tingling, no weakness, no double vision, no gait abnormality, no slurred speech, no confusion  All systems reviewed and apart from [place] all are negative _______________________________________________________________________________________________ Past Medical History:   Past Medical History:  Diagnosis Date   Arrhythmia    Cancer ([place])    skin cancer 2005   Carotid artery occlusion    Claudication of  lower extremity ([place])    Coronary artery disease    Diverticulitis    Hyperlipidemia    Hypertension    Hypothyroidism    Myopathy    Paralysis of tongue    Sick sinus syndrome ([place])      Past Surgical History:  Procedure Laterality Date   APPENDECTOMY     CAROTID ENDARTERECTOMY     CATARACT EXTRACTION, BILATERAL     CESAREAN SECTION     COLONOSCOPY     CORONARY ANGIOPLASTY     CORONARY STENT PLACEMENT     right coronary artery 1998   INGUINAL HERNIA REPAIR Right 02/25/2019   Procedure: RIGHT INGUINAL HERNIA REPAIR WITH  MESH;  Surgeon: Donnie Mesa, MD;  Location: [facility];  Service: General;  Laterality: Right;  LMA AND TAP BLOCK   MULTIPLE TOOTH EXTRACTIONS     SALIVARY GLAND SURGERY     removed 1998   SALPINGECTOMY     SKIN CANCER EXCISION     2005    Social History:  Ambulatory   independently       reports that she has quit smoking. Her smoking use included cigarettes. She has never used smokeless tobacco. She reports current alcohol use. She reports that she does not use drugs.     Family History:   Family History  Problem Relation Age of Onset   Cancer Mother    Stroke Mother    Heart disease Father 68       CAD   Heart failure Sister    Arthritis Brother    Lung cancer Brother    Pancreatic cancer Brother    Heart attack Son 22   ______________________________________________________________________________________________ Allergies: Allergies  Allergen Reactions   Cozaar [Losartan] Nausea Only   Pletaal [Cilostazol] Palpitations   Scallops [Shellfish Allergy] Swelling     Prior to Admission medications   Medication Sig Start Date End Date Taking? Authorizing Provider  amLODipine (NORVASC) 2.5 MG tablet Take 1.25 mg by mouth daily.     [provider]  aspirin EC 81 MG tablet Take 81 mg by mouth daily.    [provider]  Clobetasol Prop Emollient Base (CLOBETASOL PROPIONATE E) 0.05 % emollient cream Apply 1 application topically 2 (two) times daily. Not for face,folds or groin 05/20/20   Elk-Burning, Anderson Malta, PA-C  levothyroxine (SYNTHROID, LEVOTHROID) 75 MCG tablet Take 75 mcg by mouth daily before breakfast.  11/05/18   [provider]  metoprolol succinate (TOPROL-XL) 25 MG 24 hr tablet TAKE 1 TABLET BY MOUTH EVERY DAY 08/02/20   Minus Breeding, MD  rosuvastatin (CRESTOR) 40 MG tablet TAKE 1 TABLET BY MOUTH AT BEDTIME 01/11/21   Minus Breeding, MD  vitamin C (ASCORBIC ACID) 500 MG tablet Take 500 mg by mouth daily.    [provider]     ___________________________________________________________________________________________________ Physical Exam: Vitals with BMI 03/08/2021 03/08/2021 03/08/2021  Height - - -  Weight - - -  BMI - - -  Systolic [phone]  Diastolic 87 66 80  Pulse 65 62 70     1. General:  in No  Acute distress complaining of severe pain    Chronically ill  -appearing 2. Psychological: Alert and  Oriented 3. Head/ENT:   Moist  Mucous Membranes                          Head Non traumatic, neck supple  Poor Dentition 4. SKIN:  decreased Skin turgor,  Skin clean Dry and intact no rash 5. Heart: Regular rate and rhythm no   Murmur, no Rub or gallop 6. Lungs:  no wheezes or crackles   7. Abdomen: Soft,  non-tender, Non distended  bowel sounds present 8. Lower extremities: no clubbing, cyanosis, no  edema 9. Neurologically Grossly intact, moving all 4 extremities equally   10. MSK: Normal range of motion, limited due to pain     Chart has been reviewed  ______________________________________________________________________________________________  Assessment/Plan 87 y.o. female with medical history significant of HTN, HLD, AAA, carotid artery occlusion s/p repair (1996) dementia on Aricept  Hypothyroidims She has some moderately had normal ABIs left greater than right.  Admitted for fall with right humerus and right foot fracture and acute respiratory failure with hypoxia  Present on Admission:  Acute respiratory failure with hypoxia ([place]) -multifactorial in the setting of getting a narcotics but also likely underlying emphysema/COPD from history of smoking and pleural effusions patient has history of aortic valve disease and also imaging showing cardiomegaly. Satting 87% on room air currently on 2 L Continue to monitor and will need arrange for home oxygen if she is to go home Given abnormal imaging order CT chest to further evaluate lung parenchyma   PVD (peripheral  vascular disease) (H[facility]) chronic continue aspirin   Hyperlipidemia -chronic continue statins   Essential hypertension continue beta-blocker metoprolol   Coronary artery disease due to lipid rich plaque continue aspirin statin and beta-blocker no chest pain or any evidence of angina   Emphysema lung ([place]) -patient denies any wheezing no longer smokes.  May be cause for hypoxia   Pleural effusion -mild will order dedicated chest imaging chest x-ray did not pick up pleural effusions.  Possible mild Avoid fluid overload  Hypothyroidism - - Check TSH continue home medications at current dose   Cardiomegaly check echo   Right humerus fracture patient to follow-up orthopedics as an outpatient  Right foot fracture-wear fracture boot while ambulating weight-bear as tolerated    Other plan as per orders.  DVT prophylaxis:  SCD      Code Status:    Code Status: Not on file FULL CODE as per patient  I had personally discussed CODE STATUS with patient      Family Communication:   Family  at  Bedside  plan of care was discussed  with   Daughter,   Disposition Plan:     likely will need placement for rehabilitation                            Following barriers for discharge:                                                         Pain controlled with PO medications                               Afebrile, white count improving able to transition to PO antibiotics                             Will need to be able to tolerate PO  Will likely need home health, home O2, set up                                Would benefit from PT/OT eval prior to DC  Ordered                   Swallow eval - SLP ordered                                       Transition of care consulted                   Nutrition    consulted                                       Consults called: orthopedics is aware  Admission status:  ED Disposition     ED Disposition  [place]: [facility] [[id number]]  Level of Care: Telemetry [5]  Admit to tele based on following criteria: Other see comments  Comments: hypoxia  May admit patient to [facility] or [facility] if equivalent level of care is available:: No  Covid Evaluation: Asymptomatic Screening Protocol (No Symptoms)  Diagnosis: Acute respiratory failure with hypoxia [facility]) [[id number]]  Admitting Physician: Toy Baker [[id number]]  Attending Physician: Toy Baker [[id number]]  Estimated length of stay: past midnight tomorrow  Certification:: I certify this patient will need inpatient services for at least 2 midnights              inpatient     I Expect 2 midnight stay secondary to severity of patient's current illness need for inpatient interventions justified by the following:  hemodynamic instability despite optimal treatment ( hypoxia,  )   Severe lab/radiological/exam abnormalities including:   Right humerus fracture right fifth metatarsal fracture Right pleural effusion and possible COPD  and extensive comorbidities including:   CAD     That are currently affecting medical management.   I expect  patient to be hospitalized for 2 midnights requiring inpatient medical care.  Patient is at high risk for adverse outcome (such as loss of life or disability) if not treated.  Indication for inpatient stay as follows:    severe pain requiring acute inpatient management,    New or worsening hypoxia  Need for  IV pain medications     Level of care    tele  For  24H         Lab Results  Component Value Date   [place] 03/08/2021     Precautions: admitted as   Covid Negative     PPE: Used by the provider:   N95  eye Goggles,  Gloves    Janisse Ghan 03/09/2021, 12:47 AM    [facility]     after 2 AM please page floor coverage PA If 7AM-7PM, please contact the day team taking care of the patient  using [email or web address]   Patient was evaluated in the context of the global COVID-19 pandemic, which necessitated consideration that the patient might be at risk for infection with the SARS-CoV-2 virus that causes COVID-19. Institutional protocols and algorithms that pertain to the evaluation of patients at  risk for COVID-19 are in a state of rapid change based on information released by regulatory bodies including the CDC and federal and state organizations. These policies and algorithms were followed during the patient's care.

## 2021-03-08 NOTE — ED Notes (Signed)
Dr Joya Gaskins was sent a message for additional pain medication

## 2021-03-09 ENCOUNTER — Inpatient Hospital Stay (HOSPITAL_COMMUNITY): Payer: Medicare Other

## 2021-03-09 ENCOUNTER — Other Ambulatory Visit (HOSPITAL_COMMUNITY): Payer: Medicare Other

## 2021-03-09 ENCOUNTER — Encounter (HOSPITAL_COMMUNITY): Payer: Self-pay | Admitting: Internal Medicine

## 2021-03-09 DIAGNOSIS — J90 Pleural effusion, not elsewhere classified: Secondary | ICD-10-CM | POA: Diagnosis present

## 2021-03-09 DIAGNOSIS — I517 Cardiomegaly: Secondary | ICD-10-CM | POA: Diagnosis present

## 2021-03-09 DIAGNOSIS — J439 Emphysema, unspecified: Secondary | ICD-10-CM | POA: Diagnosis present

## 2021-03-09 LAB — BASIC METABOLIC PANEL
Anion gap: 11 (ref 5–15)
BUN: 11 mg/dL (ref 8–23)
CO2: 24 mmol/L (ref 22–32)
Calcium: 9.1 mg/dL (ref 8.9–10.3)
Chloride: 102 mmol/L (ref 98–111)
Creatinine, Ser: 0.72 mg/dL (ref 0.44–1.00)
GFR, Estimated: >60 mL/min (ref >60)
Glucose, Bld: 170 mg/dL — ABNORMAL HIGH (ref 70–99)
Potassium: 3.8 mmol/L (ref 3.5–5.1)
Sodium: 137 mmol/L (ref 135–145)

## 2021-03-09 LAB — VITAMIN D 25 HYDROXY (VIT D DEFICIENCY, FRACTURES): Vit D, 25-Hydroxy: 40.49 ng/mL (ref 30–100)

## 2021-03-09 LAB — CBC
HCT: 38.8 % (ref 36.0–46.0)
Hemoglobin: 11.9 g/dL — ABNORMAL LOW (ref 12.0–15.0)
MCH: 29 pg (ref 26.0–34.0)
MCHC: 30.7 g/dL (ref 30.0–36.0)
MCV: 94.6 fL (ref 80.0–100.0)
Platelets: 153 10*3/uL (ref 150–400)
RBC: 4.1 MIL/uL (ref 3.87–5.11)
RDW: 15.5 % (ref 11.5–15.5)
WBC: 8.2 10*3/uL (ref 4.0–10.5)
nRBC: 0 % (ref 0.0–0.2)

## 2021-03-09 LAB — TSH: TSH: 9.01 u[IU]/mL — ABNORMAL HIGH (ref 0.350–4.500)

## 2021-03-09 LAB — T4, FREE: Free T4: 0.97 ng/dL (ref 0.61–1.12)

## 2021-03-09 LAB — BRAIN NATRIURETIC PEPTIDE: B Natriuretic Peptide: 572.9 pg/mL — ABNORMAL HIGH (ref 0.0–100.0)

## 2021-03-09 MED ORDER — KETOROLAC TROMETHAMINE 15 MG/ML IJ SOLN
15.0000 mg | Freq: Three times a day (TID) | INTRAMUSCULAR | Status: DC | PRN
  Administered 2021-03-09: 15 mg via INTRAVENOUS
  Filled 2021-03-09: qty 1

## 2021-03-09 MED ORDER — FUROSEMIDE 10 MG/ML IJ SOLN
40.0000 mg | Freq: Once | INTRAMUSCULAR | Status: AC
  Administered 2021-03-09: 40 mg via INTRAVENOUS
  Filled 2021-03-09: qty 4

## 2021-03-09 MED ORDER — SODIUM CHLORIDE (PF) 0.9 % IJ SOLN
INTRAMUSCULAR | Status: AC
  Filled 2021-03-09: qty 50

## 2021-03-09 MED ORDER — DONEPEZIL HCL 10 MG PO TABS
10.0000 mg | ORAL_TABLET | Freq: Every evening | ORAL | Status: DC
  Administered 2021-03-09 – 2021-03-11 (×3): 10 mg via ORAL
  Filled 2021-03-09 (×3): qty 1

## 2021-03-09 MED ORDER — LIP MEDEX EX OINT
TOPICAL_OINTMENT | CUTANEOUS | Status: AC
  Filled 2021-03-09: qty 7

## 2021-03-09 MED ORDER — IOHEXOL 350 MG/ML SOLN
100.0000 mL | Freq: Once | INTRAVENOUS | Status: AC | PRN
  Administered 2021-03-09: 60 mL via INTRAVENOUS

## 2021-03-09 MED ORDER — MORPHINE SULFATE (PF) 2 MG/ML IV SOLN: 0.5000 mg | INTRAVENOUS | Status: DC | PRN

## 2021-03-09 MED ORDER — MORPHINE SULFATE (PF) 2 MG/ML IV SOLN
2.0000 mg | Freq: Once | INTRAVENOUS | Status: AC
Start: 2021-03-09 — End: 2021-03-09
  Administered 2021-03-09: 2 mg via INTRAVENOUS
  Filled 2021-03-09: qty 1

## 2021-03-09 MED ORDER — ONDANSETRON HCL 4 MG/2ML IJ SOLN
4.0000 mg | Freq: Once | INTRAMUSCULAR | Status: AC
  Administered 2021-03-09: 4 mg via INTRAVENOUS
  Filled 2021-03-09: qty 2

## 2021-03-09 MED ORDER — ASPIRIN EC 81 MG PO TBEC
81.0000 mg | DELAYED_RELEASE_TABLET | Freq: Every day | ORAL | Status: DC
  Administered 2021-03-09 – 2021-03-11 (×3): 81 mg via ORAL
  Filled 2021-03-09 (×3): qty 1

## 2021-03-09 MED ORDER — ACETAMINOPHEN 325 MG PO TABS
650.0000 mg | ORAL_TABLET | Freq: Four times a day (QID) | ORAL | Status: DC | PRN
  Administered 2021-03-09 – 2021-03-11 (×4): 650 mg via ORAL
  Filled 2021-03-09 (×4): qty 2

## 2021-03-09 MED ORDER — LIP MEDEX EX OINT: TOPICAL_OINTMENT | Freq: Once | CUTANEOUS | Status: AC

## 2021-03-09 MED ORDER — HYDROCODONE-ACETAMINOPHEN 5-325 MG PO TABS
1.0000 | ORAL_TABLET | Freq: Four times a day (QID) | ORAL | Status: DC | PRN
  Administered 2021-03-11 (×2): 1 via ORAL
  Filled 2021-03-09 (×2): qty 1

## 2021-03-09 NOTE — Progress Notes (Signed)
PT Cancellation Note  Patient Details Name: Raynie Steinhaus MRN: [id number] DOB: 1932-02-19   Cancelled Treatment:    Reason Eval/Treat Not Completed: Medical issues which prohibited therapy, per RN, hold for desaturation and confusion.   Claretha Cooper 03/09/2021, 3:26 PM  Tresa Endo PT Acute Rehabilitation Services Pager [phone] Office [phone]

## 2021-03-09 NOTE — Progress Notes (Signed)
  PROGRESS NOTE  Called by RN regarding patient's altered mental status this afternoon.  She had received IV Dilaudid and IV morphine earlier this morning, became more altered and drowsy as well as having oxygen desat and borderline hypotension.  Rapid response was called this afternoon.  This episode was thought to be secondary to narcotic administration.  Patient was evaluated with daughter at bedside.  Per daughter's report, patient became more alert, agitated, delirious, trying to climb out of bed.  Daughter denied that patient had formal diagnosis of dementia, but has a referral to [facility] in November 2022 for formal evaluation.  Per daughter, patient had an episode of dementia-like encephalopathy 3 months ago when she was diagnosed with a UTI.  At baseline, patient is independent of daily activities.  Per patient earlier, she denied hitting her head with the fall prior to admission.  On my examination, patient is resting quietly and comfortably.  Check UA, urine culture Check CT head Decrease IV morphine dosage and use sparingly for severe pain only.  Added Tylenol, IV Toradol for pain control Delirium precaution Ok for family to stay overnight     Dessa Phi, DO [facility] 03/09/2021, 4:02 PM  Available via Epic secure chat 7am-7pm After these hours, please refer to coverage provider listed on [email or web address]

## 2021-03-09 NOTE — Significant Event (Signed)
Rapid Response Event Note   Reason for Call : Altered Mental Status Notified by bedside RN that patient was more altered and drowsy in comparison from when the patient arrived from the ED. She was found to have right proximal humerus fracture, right fifth metatarsal fracture on admission from fall also having hypoxia requiring 2LNC.   Initial Focused Assessment:  Neuro: Patient was in and out of falling asleep when upon arrival to patient's room. Patient was slightly agitated from [place] asking her questions. However, patient was able to move extremities limited on the right side due to the fall. Pupils 1+, pin point, reactive to light, brisk bilaterally.  Pain: Patient not complaining of pain but did when ED and arriving to [facility].  Cardiac: NSR, HR 70s, BP had been slightly HTN but now upper 64G-472W systolic, see vital signs Pulmonary: Respirations 16, O2 Sats 96% on 3LNC, no respiratory distress.   Interventions:  Pain is adequately controlled. Hold of giving any additional pain medications, hold BP medications for now. Notify attending MD in regards to adjusting pain medications.    Plan of Care:  Continue to monitor on 5 [place]. If patient has worsening mental status, or significant change in hemodynamic status, please call Rapid Response at [phone].    Event Summary:   MD Notified:  Call Time: [place] Time: 1400  End Time: [place], RN

## 2021-03-09 NOTE — Progress Notes (Signed)
PROGRESS NOTE    Jasmin Crane  FXT:[id number] DOB: 06-23-1932 DOA: 03/08/2021 PCP: Hayden Rasmussen, MD     Brief Narrative:  Jasmin Crane is an 87 year old female with past medical history significant for hypertension, hyperlipidemia, AAA, carotid artery occlusion status post repair in 1996, dementia on Aricept, hypothyroidism who presents to the hospital after a fall.  She reports that she was trying to get up to use the bathroom, fell as she thinks she probably slipped or slid.  She denies any loss of consciousness or passing out.  In the emergency department, she was found to have right proximal humerus fracture, right fifth metatarsal fracture.  Patient was also incidentally found to be satting at 87% on room air and was started on 2 L oxygen.  Orthopedic surgery was consulted who recommended CT of shoulder and to follow-up with Dr. Stann Mainland in 1 week as outpatient.  New events last 24 hours / Subjective: Patient reports needing pain medication as needed.  Has not really had an appetite and has not eaten this morning.  Assessment & Plan:   Active Problems:   Essential hypertension   Hyperlipidemia   Coronary artery disease due to lipid rich plaque   PVD (peripheral vascular disease) (HCC)   Acute respiratory failure with hypoxia (HCC)   Emphysema lung (HCC)   Pleural effusion   Cardiomegaly   Fall with resultant right humerus fracture, right foot fracture -Orthopedic surgery has recommended outpatient follow-up in 1 week with Dr. Stann Mainland, continue right upper extremity sling -CT right shoulder: Comminuted fracture involving primarily the surgical neck of the proximal right humerus. Some impaction at the fracture site is noted. -Xray right foot: Avulsion fracture of the base of the fifth metatarsal. -PT OT evaluation  Acute hypoxemic respiratory failure -Patient was found to be satting 87% on room air, required 2 L oxygen -CTA chest negative for PE, but did reveal cardiomegaly  with mild interstitial pulmonary edema and small bilateral pleural effusions -Echo pending  -BNP 572.9 -IV lasix today   Peripheral artery disease -Continue aspirin  Hyperlipidemia -Continue crestor   Hypertension -Continue norvasc, Toprol  Coronary artery disease -Continue aspirin, Crestor  Hypothyroidism -TSH 9.010 -Check free T4  -Continue Synthroid  DVT prophylaxis:  SCDs Start: 03/08/21 2339  Code Status:     Code Status Orders  (From admission, onward)           Start     Ordered   03/08/21 2339  Full code  Continuous        03/08/21 2338           Code Status History     This patient has a current code status but no historical code status.      Advance Directive Documentation    Flowsheet Row Most Recent Value  Type of Advance Directive Healthcare Power of Attorney, Living will  Pre-existing out of facility DNR order (yellow form or pink MOST form) --  "MOST" Form in Place? --      Family Communication: Daughter at bedside  Disposition Plan:  Status is: Inpatient  Remains inpatient appropriate because:Inpatient level of care appropriate due to severity of illness  Dispo: The patient is from: Home              Anticipated d/c is to: Home              Patient currently is not medically stable to d/c.   Difficult to place patient No  Consultants:  Orthopedic surgery   Procedures:  None   Antimicrobials:  Anti-infectives (From admission, onward)    None        Objective: Vitals:   03/09/21 0615 03/09/21 0700 03/09/21 0736 03/09/21 0901  BP: (!) 146/60 (!) 148/50 (!) 154/61 (!) 155/65  Pulse: 65 64 71 66  Resp: 11 12 14 12   Temp:   97.6 F (36.4 C) 98 F (36.7 C)  TempSrc:   Oral Oral  SpO2: 93% 93% 91% 92%  Weight:      Height:       No intake or output data in the 24 hours ending 03/09/21 1249 Filed Weights   03/08/21 1448  Weight: 46.7 kg    Examination:  General exam: Appears calm and comfortable   Respiratory system: Diminished breath sounds without distress, no conversational dyspnea, on nasal cannula O2 Cardiovascular system: S1 & S2 heard, RRR. No murmurs. No pedal edema. Gastrointestinal system: Abdomen is nondistended, soft and nontender. Normal bowel sounds heard. Central nervous system: Alert and oriented. No focal neurological deficits. Speech clear.  Extremities: Right upper extremity in sling, right foot in brace Skin: No rashes, lesions or ulcers on exposed skin  Psychiatry: Judgement and insight appear normal. Mood & affect appropriate.   Data Reviewed: I have personally reviewed following labs and imaging studies  CBC: Recent Labs  Lab 03/08/21 1930 03/09/21 0606  WBC 4.2 8.2  NEUTROABS 2.9  --   HGB 11.7* 11.9*  HCT 37.9 38.8  MCV 95.2 94.6  PLT 150 716   Basic Metabolic Panel: Recent Labs  Lab 03/08/21 1930 03/09/21 0606  NA 139 137  K 3.7 3.8  CL 106 102  CO2 26 24  GLUCOSE 125* 170*  BUN 15 11  CREATININE 0.90 0.72  CALCIUM 8.9 9.1   GFR: Estimated Creatinine Clearance: 36.5 mL/min (by C-G formula based on SCr of 0.72 mg/dL). Liver Function Tests: No results for input(s): AST, ALT, ALKPHOS, BILITOT, PROT, ALBUMIN in the last 168 hours. No results for input(s): LIPASE, AMYLASE in the last 168 hours. No results for input(s): AMMONIA in the last 168 hours. Coagulation Profile: No results for input(s): INR, PROTIME in the last 168 hours. Cardiac Enzymes: No results for input(s): CKTOTAL, CKMB, CKMBINDEX, TROPONINI in the last 168 hours. BNP (last 3 results) No results for input(s): PROBNP in the last 8760 hours. HbA1C: No results for input(s): HGBA1C in the last 72 hours. CBG: No results for input(s): GLUCAP in the last 168 hours. Lipid Profile: No results for input(s): CHOL, HDL, LDLCALC, TRIG, CHOLHDL, LDLDIRECT in the last 72 hours. Thyroid Function Tests: Recent Labs    03/09/21 0606  TSH 9.010*   Anemia Panel: No results for  input(s): VITAMINB12, FOLATE, FERRITIN, TIBC, IRON, RETICCTPCT in the last 72 hours. Sepsis Labs: No results for input(s): PROCALCITON, LATICACIDVEN in the last 168 hours.  Recent Results (from the past 240 hour(s))  Resp Panel by RT-PCR (Flu A&B, Covid) Nasopharyngeal Swab     Status: None   Collection Time: 03/08/21  9:51 PM   Specimen: Nasopharyngeal Swab; Nasopharyngeal(NP) swabs in vial transport medium  Result Value Ref Range Status   SARS Coronavirus 2 by RT PCR NEGATIVE NEGATIVE Final    Comment: (NOTE) SARS-CoV-2 target nucleic acids are NOT DETECTED.  The SARS-CoV-2 RNA is generally detectable in upper respiratory specimens during the acute phase of infection. The lowest concentration of SARS-CoV-2 viral copies this assay can detect is 138 copies/mL. A negative result does  not preclude SARS-Cov-2 infection and should not be used as the sole basis for treatment or other patient management decisions. A negative result may occur with  improper specimen collection/handling, submission of specimen other than nasopharyngeal swab, presence of viral mutation(s) within the areas targeted by this assay, and inadequate number of viral copies(<138 copies/mL). A negative result must be combined with clinical observations, patient history, and epidemiological information. The expected result is Negative.  Fact Sheet for Patients:  EntrepreneurPulse.com.au  Fact Sheet for Healthcare Providers:  IncredibleEmployment.be  This test is no t yet approved or cleared by the [place] FDA and  has been authorized for detection and/or diagnosis of SARS-CoV-2 by FDA under an Emergency Use Authorization (EUA). This EUA will remain  in effect (meaning this test can be used) for the duration of the COVID-19 declaration under Section 564(b)(1) of the Act, 21 U.S.C.section 360bbb-3(b)(1), unless the authorization is terminated  or revoked sooner.        Influenza A by PCR NEGATIVE NEGATIVE Final   Influenza B by PCR NEGATIVE NEGATIVE Final    Comment: (NOTE) The Xpert Xpress SARS-CoV-2/FLU/RSV plus assay is intended as an aid in the diagnosis of influenza from Nasopharyngeal swab specimens and should not be used as a sole basis for treatment. Nasal washings and aspirates are unacceptable for Xpert Xpress SARS-CoV-2/FLU/RSV testing.  Fact Sheet for Patients: EntrepreneurPulse.com.au  Fact Sheet for Healthcare Providers: IncredibleEmployment.be  This test is not yet approved or cleared by the [place] FDA and has been authorized for detection and/or diagnosis of SARS-CoV-2 by FDA under an Emergency Use Authorization (EUA). This EUA will remain in effect (meaning this test can be used) for the duration of the COVID-19 declaration under Section 564(b)(1) of the Act, 21 U.S.C. section 360bbb-3(b)(1), unless the authorization is terminated or revoked.  Performed at [facility], [place] [address] [place], [place] [address]       Radiology Studies: DG Chest 2 View  Result Date: 03/08/2021 CLINICAL DATA:  Status post fall. EXAM: CHEST - 2 VIEW COMPARISON:  None. FINDINGS: There is coarsening of the pulmonary interstitium. Lungs are clear. Cardiomegaly. No pneumothorax or pleural fluid. Aortic atherosclerosis. No acute or focal bony abnormality. IMPRESSION: No acute disease. Cardiomegaly. Aortic Atherosclerosis (ICD10-I70.0). Electronically Signed   By: Inge Rise M.D.   On: 03/08/2021 18:55   DG Shoulder Right  Result Date: 03/08/2021 CLINICAL DATA:  Mechanical fall EXAM: RIGHT SHOULDER - 2+ VIEW COMPARISON:  None. FINDINGS: Mildly impacted comminuted fracture of the surgical neck of the humerus with intra-articular extension. With associated soft tissue swelling. Diffuse demineralization of bone. Chronic lung changes. IMPRESSION: Mildly impacted comminuted fracture of the  proximal humerus with intra-articular extension. Electronically Signed   By: Dahlia Bailiff MD   On: 03/08/2021 18:52   DG Elbow Complete Right  Result Date: 03/08/2021 CLINICAL DATA:  Pain after mechanical fall EXAM: RIGHT ELBOW - COMPLETE 3+ VIEW COMPARISON:  None. FINDINGS: There is no evidence of fracture, dislocation, or joint effusion. There is no evidence of arthropathy. Diffuse demineralization of bone. Soft tissues are unremarkable. IMPRESSION: No fracture or dislocation of the right elbow. No elbow joint effusion. Electronically Signed   By: Dahlia Bailiff MD   On: 03/08/2021 18:52   DG Wrist Complete Right  Result Date: 03/08/2021 CLINICAL DATA:  Pain after mechanical fall EXAM: RIGHT WRIST - COMPLETE 3+ VIEW COMPARISON:  None. FINDINGS: There is no evidence of fracture or dislocation. There is no evidence of  significant arthropathy. Diffuse demineralization of bone. Soft tissues are unremarkable. IMPRESSION: No fracture or dislocation of the right wrist. Electronically Signed   By: Dahlia Bailiff MD   On: 03/08/2021 18:54   DG Tibia/Fibula Left  Result Date: 03/08/2021 CLINICAL DATA:  Recent fall with left lower leg pain, initial encounter EXAM: LEFT TIBIA AND FIBULA - 2 VIEW COMPARISON:  None. FINDINGS: There is no evidence of fracture or other focal bone lesions. Soft tissues are unremarkable. IMPRESSION: No acute abnormality noted. Electronically Signed   By: Inez Catalina M.D.   On: 03/08/2021 18:56   CT Angio Chest Pulmonary Embolism (PE) W or WO Contrast  Result Date: 03/09/2021 CLINICAL DATA:  87 year old female with high clinical suspicion of pulmonary embolism. EXAM: CT ANGIOGRAPHY CHEST WITH CONTRAST TECHNIQUE: Multidetector CT imaging of the chest was performed using the standard protocol during bolus administration of intravenous contrast. Multiplanar CT image reconstructions and MIPs were obtained to evaluate the vascular anatomy. CONTRAST:  26mL OMNIPAQUE IOHEXOL 350 MG/ML  SOLN COMPARISON:  No priors. FINDINGS: Cardiovascular: There are no filling defects within the pulmonary arterial tree to suggest pulmonary embolism. Heart size is mildly enlarged. There is no significant pericardial fluid, thickening or pericardial calcification. There is aortic atherosclerosis, as well as atherosclerosis of the great vessels of the mediastinum and the coronary arteries, including calcified atherosclerotic plaque in the left main, left anterior descending, left circumflex and right coronary arteries. Thickening and calcification of the aortic valve. Mediastinum/Nodes: No pathologically enlarged mediastinal or hilar lymph nodes. Esophagus is unremarkable in appearance. No axillary lymphadenopathy. Lungs/Pleura: Small bilateral pleural effusions lying dependently with areas of passive subsegmental atelectasis in the lower lobes of the lungs bilaterally. There is a background of mild diffuse ground-glass attenuation and widespread interlobular septal thickening, suggesting interstitial pulmonary edema. No confluent consolidative airspace disease. No pleural effusions. No definite suspicious appearing pulmonary nodules or masses are noted. Scarring or subsegmental atelectasis in the medial aspect of the right upper lobe and medial segment of the right middle lobe, as well as inferior segment of the lingula. Upper Abdomen: Aortic atherosclerosis. Musculoskeletal: There are no aggressive appearing lytic or blastic lesions noted in the visualized portions of the skeleton. Review of the MIP images confirms the above findings. IMPRESSION: 1. No evidence of pulmonary embolism. 2. Cardiomegaly with evidence of mild interstitial pulmonary edema and small bilateral pleural effusions; imaging findings suggestive of underlying congestive heart failure. 3. Passive subsegmental atelectasis in the lower lobes of the lungs bilaterally. 4. Aortic atherosclerosis, in addition to left main and 3 vessel coronary artery  disease. 5. There are calcifications of the aortic valve. Echocardiographic correlation for evaluation of potential valvular dysfunction may be warranted if clinically indicated. Aortic Atherosclerosis (ICD10-I70.0). Electronically Signed   By: Vinnie Langton M.D.   On: 03/09/2021 08:01   CT Shoulder Right Wo Contrast  Result Date: 03/08/2021 CLINICAL DATA:  Known proximal right humeral fracture EXAM: CT OF THE UPPER RIGHT EXTREMITY WITHOUT CONTRAST TECHNIQUE: Multidetector CT imaging of the upper right extremity was performed according to the standard protocol. COMPARISON:  Plain film from earlier in the same day. FINDINGS: Bones/Joint/Cartilage Degenerative changes of the cervical and thoracic spine are noted. Visualized ribcage demonstrates some old healed rib fractures on the right. The scapula and clavicle are within normal limits. The proximal humerus demonstrates a comminuted fracture involving primarily the surgical neck with impaction at the fracture site. No dislocation is seen. No other focal bony abnormality is noted. Ligaments Suboptimally assessed by  CT. Muscles and Tendons Surrounding musculature appears within normal limits. Rotator cuff as visualized appears intact. Soft tissues Lungs demonstrate evidence of a right-sided pleural effusion and mild emphysematous changes. Surrounding soft tissue structures show no acute abnormality. IMPRESSION: Comminuted fracture involving primarily the surgical neck of the proximal right humerus. Some impaction at the fracture site is noted. Right-sided pleural effusion and emphysematous changes. Electronically Signed   By: Inez Catalina M.D.   On: 03/08/2021 22:18   DG Knee Complete 4 Views Left  Result Date: 03/08/2021 CLINICAL DATA:  Recent fall with left knee pain, initial encounter EXAM: LEFT KNEE - COMPLETE 4+ VIEW COMPARISON:  None. FINDINGS: No evidence of fracture, dislocation, or joint effusion. No evidence of arthropathy or other focal bone  abnormality. Soft tissues are unremarkable. IMPRESSION: No acute abnormality noted. Electronically Signed   By: Inez Catalina M.D.   On: 03/08/2021 18:55   DG Hand Complete Right  Result Date: 03/08/2021 CLINICAL DATA:  Pain after fall EXAM: RIGHT HAND - COMPLETE 3+ VIEW COMPARISON:  None. FINDINGS: There is no evidence of fracture or dislocation. There is no evidence of arthropathy. Irregular shortening of the fifth metacarpal, favor congenital versus sequela prior trauma. Diffuse demineralization of bone. Soft tissues are unremarkable. IMPRESSION: No acute osseous abnormality in the right hand. Electronically Signed   By: Dahlia Bailiff MD   On: 03/08/2021 18:56   DG Foot Complete Right  Result Date: 03/08/2021 CLINICAL DATA:  87 year old female with fall and right foot pain. EXAM: RIGHT FOOT COMPLETE - 3+ VIEW COMPARISON:  None. FINDINGS: There is a minimally displaced avulsion fracture of the base of the fifth metatarsal. No other acute fracture identified. The bones are osteopenic. There is no dislocation. Mild soft tissue swelling of the dorsum of the foot. No radiopaque foreign object or soft tissue gas. IMPRESSION: Avulsion fracture of the base of the fifth metatarsal. Electronically Signed   By: Anner Crete M.D.   On: 03/08/2021 20:31      Scheduled Meds:  amLODipine  1.25 mg Oral Daily   levothyroxine  75 mcg Oral Q0600   metoprolol succinate  25 mg Oral Daily   rosuvastatin  40 mg Oral QHS   Continuous Infusions:  methocarbamol (ROBAXIN) IV 500 mg (03/09/21 0612)     LOS: 1 day      Time spent: 25 minutes   Dessa Phi, DO [facility] 03/09/2021, 12:49 PM   Available via Epic secure chat 7am-7pm After these hours, please refer to coverage provider listed on [email or web address]

## 2021-03-09 NOTE — Progress Notes (Signed)
OT Cancellation Note  Patient Details Name: Jasmin Crane MRN: [id number] DOB: 31-Mar-1932   Cancelled Treatment:    Reason Eval/Treat Not Completed: Medical issues which prohibited therapy. RN reports recent desaturations and confusion. Will hold today and f/u as able.  Deneshia Zucker L Aahan Marques 03/09/2021, 3:12 PM

## 2021-03-09 NOTE — ED Notes (Signed)
Pt moved to hospital bed for comfort.

## 2021-03-09 NOTE — Evaluation (Signed)
SLP Cancellation Note  Patient Details Name: Jasmin Crane MRN: [id number] DOB: 09-29-1931   Cancelled treatment:       Reason Eval/Treat Not Completed: Other (comment) (per daughter Madelyn, pt fell asleep approx one hour ago and was having significant pain, she denies pt having dysphagia prior to admit, prior CEA on right approx 3 years ago with ? 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  Daughter reports this caused speech deficits, to which pt compensates for due to her concern.   In discussion with daughter, since pt is sleeping finally and having pain, will continue efforts.  Will follow up next date if unable to return this pm.  Advised daughter that pt may have an easier time swallowing medications when fully alert with applesauce - to which she agreed.   Kathleen Lime, MS [facility] SLP Acute Rehab Services Office [phone] Pager [phone]  Macario Golds 03/09/2021, 12:39 PM

## 2021-03-09 NOTE — ED Notes (Signed)
Pt states no improvement in pain after medication administration

## 2021-03-09 NOTE — ED Notes (Signed)
Pt taken to CT.

## 2021-03-09 NOTE — ED Notes (Signed)
Pt right leg elevated on pillow to help with pain

## 2021-03-09 NOTE — Progress Notes (Signed)
Initial Nutrition Assessment  DOCUMENTATION CODES:   Underweight  INTERVENTION:   Once mental status improves, -Recommend Ensure Enlive po BID, each supplement provides 350 kcal and 20 grams of protein  NUTRITION DIAGNOSIS:   Increased nutrient needs related to hip fracture as evidenced by estimated needs.  GOAL:   Patient will meet greater than or equal to 90% of their needs  MONITOR:   PO intake, Supplement acceptance, Labs, Weight trends, I & O's  REASON FOR ASSESSMENT:   Consult Hip fracture protocol  ASSESSMENT:   87 y.o. female with medical history significant of HTN, HLD, AAA, carotid artery occlusion s/p repair (1996) dementia on Aricept   Hypothyroidims She has some moderately had normal ABIs left greater than right.   Admitted for fall with right humerus and right foot fracture and acute respiratory failure with hypoxia.  Pt in room with  daughter at bedside. Pt lying in bed, seemed a bit lethargic. Was speaking to daughter about how they needed to have a talk. Pt was very insistent  on speaking with daughter. Per daughter, pt with very little appetite and not eating much. Had received a bit too much morphine. Offered to order some lunch for pt but pt states she didn't want to talk about it, stating her leg hurts. RD offered to order a protein shake for pt and pt's daughter states they would like to see how she does with meals going forward before considering.  Per chart review, pt had a rapid response event prior to visit.   Per weight records, pt's weight has been trending down since 2018. No significant weight changes noted. Suspect pt with some degree of malnutrition.  Medications: IV Lasix, IV Zofran  Labs reviewed.  NUTRITION - FOCUSED PHYSICAL EXAM:  Deferred.  Diet Order:   Diet Order             Diet Heart Room service appropriate? Yes; Fluid consistency: Thin  Diet effective now                   EDUCATION NEEDS:   No education needs  have been identified at this time  Skin:  Skin Assessment: Reviewed RN Assessment  Last BM:  PTA  Height:   Ht Readings from Last 1 Encounters:  03/08/21 5\' 2"  (1.575 m)    Weight:   Wt Readings from Last 1 Encounters:  03/08/21 46.7 kg    BMI:  Body mass index is 18.84 kg/m.  Estimated Nutritional Needs:   Kcal:  1300-1500  Protein:  60-75g  Fluid:  1.5L/day  Clayton Bibles, MS, RD, LDN Inpatient Clinical Dietitian Contact information available via Amion

## 2021-03-09 NOTE — ED Notes (Signed)
Pt tearful and states the morphine did not alleviate her pain at all. Pt moaning and crying. Dr. Roel Cluck notified

## 2021-03-10 ENCOUNTER — Inpatient Hospital Stay (HOSPITAL_COMMUNITY): Payer: Medicare Other

## 2021-03-10 DIAGNOSIS — I517 Cardiomegaly: Secondary | ICD-10-CM

## 2021-03-10 LAB — BASIC METABOLIC PANEL
Anion gap: 7 (ref 5–15)
BUN: 18 mg/dL (ref 8–23)
CO2: 28 mmol/L (ref 22–32)
Calcium: 8.6 mg/dL — ABNORMAL LOW (ref 8.9–10.3)
Chloride: 100 mmol/L (ref 98–111)
Creatinine, Ser: 1.14 mg/dL — ABNORMAL HIGH (ref 0.44–1.00)
GFR, Estimated: 47 mL/min — ABNORMAL LOW (ref >60)
Glucose, Bld: 88 mg/dL (ref 70–99)
Potassium: 3.6 mmol/L (ref 3.5–5.1)
Sodium: 135 mmol/L (ref 135–145)

## 2021-03-10 LAB — ECHOCARDIOGRAM COMPLETE
AR max vel: 2 cm2
AV Area VTI: 1.91 cm2
AV Area mean vel: 1.96 cm2
AV Mean grad: 10 mmHg
AV Peak grad: 16.3 mmHg
Ao pk vel: 2.02 m/s
Area-P 1/2: 3.45 cm2
Calc EF: 64.7 %
Height: 62 in
P 1/2 time: 534 msec
S' Lateral: 2.6 cm
Single Plane A2C EF: 66.4 %
Single Plane A4C EF: 58.8 %
Weight: 1648 oz

## 2021-03-10 LAB — URINALYSIS, ROUTINE W REFLEX MICROSCOPIC
Bilirubin Urine: NEGATIVE
Glucose, UA: NEGATIVE mg/dL
Hgb urine dipstick: NEGATIVE
Ketones, ur: NEGATIVE mg/dL
Leukocytes,Ua: NEGATIVE
Nitrite: NEGATIVE
Protein, ur: NEGATIVE mg/dL
Specific Gravity, Urine: 1.005 (ref 1.005–1.030)
pH: 6 (ref 5.0–8.0)

## 2021-03-10 NOTE — Progress Notes (Signed)
Orthopedic Tech Progress Note Patient Details:  Jasmin Crane Oct 04, 1931 [id number]  Patient ID: [name], female   DOB: Sep 10, 1931, 87 y.o.   MRN: [id number]  Kennis Carina 03/10/2021, 12:35 PM Sling checked and adjusted and educated pt and family on proper adjustment if needed.

## 2021-03-10 NOTE — Evaluation (Addendum)
Physical Therapy Evaluation Patient Details Name: Jasmin Crane MRN: [id number] DOB: Mar 25, 1932 Today's Date: 03/10/2021   History of Present Illness  Patient is a 87 year old female who presented to the hospital 03/08/21 after a fall. Dx of R proximal humerus fx, R 5th metatarsal fx, hypoxia. Pt with past medical history significant for hypertension, hyperlipidemia, AAA, carotid artery occlusion status post repair in 1996, dementia on Aricept.  Clinical Impression  Pt admitted with above diagnosis. Min assist to ambulate 12' x 2 with hand held assist. Distance limited by fatigue and R shoulder pain. ST-SNF recommended, pt's daughter in room and is agreeable to this plan.  Pt currently with functional limitations due to the deficits listed below (see PT Problem List). Pt will benefit from skilled PT to increase their independence and safety with mobility to allow discharge to the venue listed below.       Follow Up Recommendations SNF;Supervision for mobility/OOB;Supervision/Assistance - 24 hour    Equipment Recommendations  [employer]    Recommendations for Other Services       Precautions / Restrictions Precautions Precautions: Fall Type of Shoulder Precautions: NWB RUE; pt denies other falls in past 1 year Shoulder Interventions: Shoulder sling/immobilizer Restrictions Weight Bearing Restrictions: Yes RUE Weight Bearing: Non weight bearing RLE Weight Bearing: Weight bearing as tolerated      Mobility  Bed Mobility Overal bed mobility: Needs Assistance Bed Mobility: Sit to Supine     Supine to sit: Mod assist Sit to supine: Mod assist   General bed mobility comments: assist for BLEs into bed    Transfers Overall transfer level: Needs assistance Equipment used: 1 person hand held assist Transfers: Sit to/from Stand Sit to Stand: Min assist         General transfer comment: VCs hand placement, min A to power up  Ambulation/Gait Ambulation/Gait assistance: Min  guard;Min assist Gait Distance (Feet): 24 Feet Assistive device: 1 person hand held assist Gait Pattern/deviations: Step-through pattern;Decreased stride length;Narrow base of support Gait velocity: decr   General Gait Details: 1 person HHA on L, RUE in sling, min A for balance, distance limited by fatigue. 12' x 2 recliner to commode to bed  Stairs            Wheelchair Mobility    Modified Rankin (Stroke Patients Only)       Balance Overall balance assessment: Needs assistance Sitting-balance support: Single extremity supported Sitting balance-Leahy Scale: Fair     Standing balance support: Single extremity supported Standing balance-Leahy Scale: Poor Standing balance comment: patient was noted to have some posterior leaning on bed in standing.                             Pertinent Vitals/Pain Pain Assessment: 0-10 Pain Score: 2  Faces Pain Scale: Hurts even more Pain Location: right arm Pain Descriptors / Indicators: Grimacing;Guarding Pain Intervention(s): Limited activity within patient's tolerance;Monitored during session;Patient requesting pain meds-RN notified    Home Living Family/patient expects to be discharged to:: Private residence Living Arrangements: Alone Available Help at Discharge: Available PRN/intermittently;Family Type of Home: House Home Access: Stairs to enter Entrance Stairs-Rails: None Entrance Stairs-Number of Steps: 1 step Home Layout: One level Home Equipment: None Additional Comments: daughter stated they may consider hiring private aide if they decide to DC home, also interested in looking into ST-SNF for rehab    Prior Function Level of Independence: Independent         Comments:  1 step to enter with no rail     Hand Dominance   Dominant Hand: Right    Extremity/Trunk Assessment   Upper Extremity Assessment Upper Extremity Assessment: Defer to OT evaluation RUE Deficits / Details: NWB in sling at this  time with humeral fracture.    Lower Extremity Assessment Lower Extremity Assessment: Overall WFL for tasks assessed    Cervical / Trunk Assessment Cervical / Trunk Assessment: Normal  Communication   Communication: No difficulties  Cognition Arousal/Alertness: Awake/alert Behavior During Therapy: WFL for tasks assessed/performed Overall Cognitive Status: Within Functional Limits for tasks assessed                                        General Comments      Exercises     Assessment/Plan    PT Assessment Patient needs continued PT services  PT Problem List Decreased mobility;Decreased activity tolerance;Decreased balance;Pain       PT Treatment Interventions Therapeutic exercise;Therapeutic activities;Gait training;Functional mobility training;Patient/family education;Balance training    PT Goals (Current goals can be found in the Care Plan section)  Acute Rehab PT Goals Patient Stated Goal: get strong enough to go home PT Goal Formulation: With patient/family Time For Goal Achievement: 03/24/21 Potential to Achieve Goals: Good    Frequency Min 3X/week   Barriers to discharge        Co-evaluation               AM-PAC PT "6 Clicks" Mobility  Outcome Measure Help needed turning from your back to your side while in a flat bed without using bedrails?: A Lot Help needed moving from lying on your back to sitting on the side of a flat bed without using bedrails?: A Lot Help needed moving to and from a bed to a chair (including a wheelchair)?: A Lot Help needed standing up from a chair using your arms (e.g., wheelchair or bedside chair)?: A Little Help needed to walk in hospital room?: A Little Help needed climbing 3-5 steps with a railing? : A Lot 6 Click Score: 14    End of Session Equipment Utilized During Treatment: Gait belt;Oxygen Activity Tolerance: Patient limited by fatigue;Patient limited by pain Patient left: in bed;with call  bell/phone within reach;with nursing/sitter in room;with family/visitor present Nurse Communication: Mobility status PT Visit Diagnosis: Difficulty in walking, not elsewhere classified (R26.2);Pain;Unsteadiness on feet (R26.81);History of falling (Z91.81) Pain - Right/Left: Right Pain - part of body: Shoulder    Time: 4742-5956 PT Time Calculation (min) (ACUTE ONLY): 25 min   Charges:   PT Evaluation $PT Eval Moderate Complexity: 1 Mod PT Treatments $Gait Training: 8-22 mins        Blondell Reveal Kistler PT 03/10/2021  Acute Rehabilitation Services Pager [phone] Office [phone]

## 2021-03-10 NOTE — Progress Notes (Signed)
PROGRESS NOTE    Jasmin Crane  VQQ:[id number] DOB: 12/12/1931 DOA: 03/08/2021 PCP: Hayden Rasmussen, MD     Brief Narrative:  Jasmin Crane is an 87 year old female with past medical history significant for hypertension, hyperlipidemia, AAA, carotid artery occlusion status post repair in 1996, dementia on Aricept, hypothyroidism who presents to the hospital after a fall.  She reports that she was trying to get up to use the bathroom, fell as she thinks she probably slipped or slid.  She denies any loss of consciousness or passing out.  In the emergency department, she was found to have right proximal humerus fracture, right fifth metatarsal fracture.  Patient was also incidentally found to be satting at 87% on room air and was started on 2 L oxygen.  Orthopedic surgery was consulted who recommended CT of shoulder and to follow-up with Dr. Stann Mainland in 1 week as outpatient.  Had an episode of confusion, agitation on afternoon of 7/13 thought to be medication induced in setting of undiagnosed dementia.  New events last 24 hours / Subjective: Patient much more calm, alert, pleasant this morning.  She states that she recalls yesterday's events, states that she was crying out because she was in significant amount of pain.  Per daughter at bedside, patient had a good night and rested without much issue.  Assessment & Plan:   Active Problems:   Essential hypertension   Hyperlipidemia   Coronary artery disease due to lipid rich plaque   PVD (peripheral vascular disease) (HCC)   Acute respiratory failure with hypoxia (HCC)   Emphysema lung (HCC)   Pleural effusion   Cardiomegaly   Fall with resultant right humerus fracture, right foot fracture -Orthopedic surgery has recommended outpatient follow-up in 1 week with Dr. Stann Mainland, continue right upper extremity sling -CT right shoulder: Comminuted fracture involving primarily the surgical neck of the proximal right humerus. Some impaction at the fracture  site is noted. -Xray right foot: Avulsion fracture of the base of the fifth metatarsal. -PT OT evaluation recommending SNF placement.  TOC consulted   Acute hypoxemic respiratory failure -Patient was found to be satting 87% on room air, required 2 L oxygen -CTA chest negative for PE, but did reveal cardiomegaly with mild interstitial pulmonary edema and small bilateral pleural effusions -Echo pending  -BNP 572.9 -Hold off on additional Lasix dose for now -Does not appear overtly fluid overloaded now   Peripheral artery disease -Continue aspirin  Hyperlipidemia -Continue crestor   Hypertension -Continue norvasc, Toprol  Coronary artery disease -Continue aspirin, Crestor  Hypothyroidism -TSH 9.010, free T4 0.97 -Continue Synthroid  Likely underlying dementia -Had episode of confusion, agitation yesterday afternoon -CT head showed moderate atrophy, chronic microvascular ischemia, no acute intracranial abnormality -Delirium precaution  DVT prophylaxis:  SCDs Start: 03/08/21 2339  Code Status:     Code Status Orders  (From admission, onward)           Start     Ordered   03/08/21 2339  Full code  Continuous        03/08/21 2338           Code Status History     This patient has a current code status but no historical code status.      Advance Directive Documentation    Flowsheet Row Most Recent Value  Type of Advance Directive Healthcare Power of Attorney, Living will  Pre-existing out of facility DNR order (yellow form or pink MOST form) --  "MOST" Form in Place? --  Family Communication: Daughter at bedside  Disposition Plan:  Status is: Inpatient  Remains inpatient appropriate because:Inpatient level of care appropriate due to severity of illness  Dispo: The patient is from: Home              Anticipated d/c is to: SNF              Patient currently is not medically stable to d/c.   Difficult to place patient No      Consultants:   Orthopedic surgery   Procedures:  None   Antimicrobials:  Anti-infectives (From admission, onward)    None        Objective: Vitals:   03/09/21 1358 03/09/21 1656 03/09/21 2104 03/10/21 0442  BP: (!) 108/48 (!) 121/55 (!) 111/55 (!) 104/50  Pulse:  64 77 67  Resp:  16 18 18   Temp:  99 F (37.2 C) (!) 97.4 F (36.3 C) 98.6 F (37 C)  TempSrc:  Oral Oral Oral  SpO2:  93% 96% 96%  Weight:      Height:        Intake/Output Summary (Last 24 hours) at 03/10/2021 1212 Last data filed at 03/09/2021 1700 Gross per 24 hour  Intake 50.84 ml  Output --  Net 50.84 ml   Filed Weights   03/08/21 1448  Weight: 46.7 kg   Examination: General exam: Appears calm and comfortable  Respiratory system: Diminished breath sound.  No conversational dyspnea.  Respiratory effort normal. Cardiovascular system: S1 & S2 heard, RRR. No pedal edema. Gastrointestinal system: Abdomen is nondistended, soft and nontender. Normal bowel sounds heard. Central nervous system: Alert and oriented. Non focal exam. Speech clear  Extremities: Right upper extremity in sling, right foot in boot Psychiatry: Judgement and insight appear stable. Mood & affect appropriate.    Data Reviewed: I have personally reviewed following labs and imaging studies  CBC: Recent Labs  Lab 03/08/21 1930 03/09/21 0606  WBC 4.2 8.2  NEUTROABS 2.9  --   HGB 11.7* 11.9*  HCT 37.9 38.8  MCV 95.2 94.6  PLT 150 250    Basic Metabolic Panel: Recent Labs  Lab 03/08/21 1930 03/09/21 0606 03/10/21 0450  NA 139 137 135  K 3.7 3.8 3.6  CL 106 102 100  CO2 26 24 28   GLUCOSE 125* 170* 88  BUN 15 11 18   CREATININE 0.90 0.72 1.14*  CALCIUM 8.9 9.1 8.6*    GFR: Estimated Creatinine Clearance: 25.6 mL/min (A) (by C-G formula based on SCr of 1.14 mg/dL (H)). Liver Function Tests: No results for input(s): AST, ALT, ALKPHOS, BILITOT, PROT, ALBUMIN in the last 168 hours. No results for input(s): LIPASE, AMYLASE in the  last 168 hours. No results for input(s): AMMONIA in the last 168 hours. Coagulation Profile: No results for input(s): INR, PROTIME in the last 168 hours. Cardiac Enzymes: No results for input(s): CKTOTAL, CKMB, CKMBINDEX, TROPONINI in the last 168 hours. BNP (last 3 results) No results for input(s): PROBNP in the last 8760 hours. HbA1C: No results for input(s): HGBA1C in the last 72 hours. CBG: No results for input(s): GLUCAP in the last 168 hours. Lipid Profile: No results for input(s): CHOL, HDL, LDLCALC, TRIG, CHOLHDL, LDLDIRECT in the last 72 hours. Thyroid Function Tests: Recent Labs    03/09/21 0606  TSH 9.010*  FREET4 0.97    Anemia Panel: No results for input(s): VITAMINB12, FOLATE, FERRITIN, TIBC, IRON, RETICCTPCT in the last 72 hours. Sepsis Labs: No results for input(s): PROCALCITON, LATICACIDVEN in  the last 168 hours.  Recent Results (from the past 240 hour(s))  Resp Panel by RT-PCR (Flu A&B, Covid) Nasopharyngeal Swab     Status: None   Collection Time: 03/08/21  9:51 PM   Specimen: Nasopharyngeal Swab; Nasopharyngeal(NP) swabs in vial transport medium  Result Value Ref Range Status   SARS Coronavirus 2 by RT PCR NEGATIVE NEGATIVE Final    Comment: (NOTE) SARS-CoV-2 target nucleic acids are NOT DETECTED.  The SARS-CoV-2 RNA is generally detectable in upper respiratory specimens during the acute phase of infection. The lowest concentration of SARS-CoV-2 viral copies this assay can detect is 138 copies/mL. A negative result does not preclude SARS-Cov-2 infection and should not be used as the sole basis for treatment or other patient management decisions. A negative result may occur with  improper specimen collection/handling, submission of specimen other than nasopharyngeal swab, presence of viral mutation(s) within the areas targeted by this assay, and inadequate number of viral copies(<138 copies/mL). A negative result must be combined with clinical  observations, patient history, and epidemiological information. The expected result is Negative.  Fact Sheet for Patients:  EntrepreneurPulse.com.au  Fact Sheet for Healthcare Providers:  IncredibleEmployment.be  This test is no t yet approved or cleared by the [place] FDA and  has been authorized for detection and/or diagnosis of SARS-CoV-2 by FDA under an Emergency Use Authorization (EUA). This EUA will remain  in effect (meaning this test can be used) for the duration of the COVID-19 declaration under Section 564(b)(1) of the Act, 21 U.S.C.section 360bbb-3(b)(1), unless the authorization is terminated  or revoked sooner.       Influenza A by PCR NEGATIVE NEGATIVE Final   Influenza B by PCR NEGATIVE NEGATIVE Final    Comment: (NOTE) The Xpert Xpress SARS-CoV-2/FLU/RSV plus assay is intended as an aid in the diagnosis of influenza from Nasopharyngeal swab specimens and should not be used as a sole basis for treatment. Nasal washings and aspirates are unacceptable for Xpert Xpress SARS-CoV-2/FLU/RSV testing.  Fact Sheet for Patients: EntrepreneurPulse.com.au  Fact Sheet for Healthcare Providers: IncredibleEmployment.be  This test is not yet approved or cleared by the [place] FDA and has been authorized for detection and/or diagnosis of SARS-CoV-2 by FDA under an Emergency Use Authorization (EUA). This EUA will remain in effect (meaning this test can be used) for the duration of the COVID-19 declaration under Section 564(b)(1) of the Act, 21 U.S.C. section 360bbb-3(b)(1), unless the authorization is terminated or revoked.  Performed at [facility], [place] [address], [place], [place] [address]        Radiology Studies: DG Chest 2 View  Result Date: 03/08/2021 CLINICAL DATA:  Status post fall. EXAM: CHEST - 2 VIEW COMPARISON:  None. FINDINGS: There is coarsening of the  pulmonary interstitium. Lungs are clear. Cardiomegaly. No pneumothorax or pleural fluid. Aortic atherosclerosis. No acute or focal bony abnormality. IMPRESSION: No acute disease. Cardiomegaly. Aortic Atherosclerosis (ICD10-I70.0). Electronically Signed   By: Inge Rise M.D.   On: 03/08/2021 18:55   DG Shoulder Right  Result Date: 03/08/2021 CLINICAL DATA:  Mechanical fall EXAM: RIGHT SHOULDER - 2+ VIEW COMPARISON:  None. FINDINGS: Mildly impacted comminuted fracture of the surgical neck of the humerus with intra-articular extension. With associated soft tissue swelling. Diffuse demineralization of bone. Chronic lung changes. IMPRESSION: Mildly impacted comminuted fracture of the proximal humerus with intra-articular extension. Electronically Signed   By: Dahlia Bailiff MD   On: 03/08/2021 18:52   DG Elbow Complete Right  Result  Date: 03/08/2021 CLINICAL DATA:  Pain after mechanical fall EXAM: RIGHT ELBOW - COMPLETE 3+ VIEW COMPARISON:  None. FINDINGS: There is no evidence of fracture, dislocation, or joint effusion. There is no evidence of arthropathy. Diffuse demineralization of bone. Soft tissues are unremarkable. IMPRESSION: No fracture or dislocation of the right elbow. No elbow joint effusion. Electronically Signed   By: Dahlia Bailiff MD   On: 03/08/2021 18:52   DG Wrist Complete Right  Result Date: 03/08/2021 CLINICAL DATA:  Pain after mechanical fall EXAM: RIGHT WRIST - COMPLETE 3+ VIEW COMPARISON:  None. FINDINGS: There is no evidence of fracture or dislocation. There is no evidence of significant arthropathy. Diffuse demineralization of bone. Soft tissues are unremarkable. IMPRESSION: No fracture or dislocation of the right wrist. Electronically Signed   By: Dahlia Bailiff MD   On: 03/08/2021 18:54   DG Tibia/Fibula Left  Result Date: 03/08/2021 CLINICAL DATA:  Recent fall with left lower leg pain, initial encounter EXAM: LEFT TIBIA AND FIBULA - 2 VIEW COMPARISON:  None. FINDINGS:  There is no evidence of fracture or other focal bone lesions. Soft tissues are unremarkable. IMPRESSION: No acute abnormality noted. Electronically Signed   By: Inez Catalina M.D.   On: 03/08/2021 18:56   CT HEAD WO CONTRAST  Result Date: 03/09/2021 CLINICAL DATA:  Mental status change. Delirium. Oxygen desaturation episode of encephalopathy related to urinary tract infection 3 months ago. EXAM: CT HEAD WITHOUT CONTRAST TECHNIQUE: Contiguous axial images were obtained from the base of the skull through the vertex without intravenous contrast. COMPARISON:  None. FINDINGS: Brain: Moderate atrophy and diffuse confluent periventricular white matter hypoattenuation is present bilaterally. No acute infarct, hemorrhage, or mass lesion is present. White matter changes are most pronounced posteriorly with disc proportionate posterior enlargement of the ventricles. This is worse right than left. The brainstem and cerebellum are within normal limits. Vascular: Atherosclerotic calcifications are present within the cavernous internal carotid arteries. Calcifications are present at the dural margin of the right vertebral artery. No hyperdense vessel is present. Skull: Calvarium is intact. No focal lytic or blastic lesions are present. No significant extracranial soft tissue lesion is present. Sinuses/Orbits: The paranasal sinuses and mastoid air cells are clear. Bilateral lens replacements are noted. Globes and orbits are otherwise unremarkable. IMPRESSION: 1. Moderate atrophy and diffuse confluent periventricular white matter hypoattenuation bilaterally. This likely reflects the sequela of chronic microvascular ischemia. 2. No acute intracranial abnormality. Electronically Signed   By: San Morelle M.D.   On: 03/09/2021 17:26   CT Angio Chest Pulmonary Embolism (PE) W or WO Contrast  Result Date: 03/09/2021 CLINICAL DATA:  87 year old female with high clinical suspicion of pulmonary embolism. EXAM: CT ANGIOGRAPHY  CHEST WITH CONTRAST TECHNIQUE: Multidetector CT imaging of the chest was performed using the standard protocol during bolus administration of intravenous contrast. Multiplanar CT image reconstructions and MIPs were obtained to evaluate the vascular anatomy. CONTRAST:  2mL OMNIPAQUE IOHEXOL 350 MG/ML SOLN COMPARISON:  No priors. FINDINGS: Cardiovascular: There are no filling defects within the pulmonary arterial tree to suggest pulmonary embolism. Heart size is mildly enlarged. There is no significant pericardial fluid, thickening or pericardial calcification. There is aortic atherosclerosis, as well as atherosclerosis of the great vessels of the mediastinum and the coronary arteries, including calcified atherosclerotic plaque in the left main, left anterior descending, left circumflex and right coronary arteries. Thickening and calcification of the aortic valve. Mediastinum/Nodes: No pathologically enlarged mediastinal or hilar lymph nodes. Esophagus is unremarkable in appearance. No axillary lymphadenopathy.  Lungs/Pleura: Small bilateral pleural effusions lying dependently with areas of passive subsegmental atelectasis in the lower lobes of the lungs bilaterally. There is a background of mild diffuse ground-glass attenuation and widespread interlobular septal thickening, suggesting interstitial pulmonary edema. No confluent consolidative airspace disease. No pleural effusions. No definite suspicious appearing pulmonary nodules or masses are noted. Scarring or subsegmental atelectasis in the medial aspect of the right upper lobe and medial segment of the right middle lobe, as well as inferior segment of the lingula. Upper Abdomen: Aortic atherosclerosis. Musculoskeletal: There are no aggressive appearing lytic or blastic lesions noted in the visualized portions of the skeleton. Review of the MIP images confirms the above findings. IMPRESSION: 1. No evidence of pulmonary embolism. 2. Cardiomegaly with evidence of  mild interstitial pulmonary edema and small bilateral pleural effusions; imaging findings suggestive of underlying congestive heart failure. 3. Passive subsegmental atelectasis in the lower lobes of the lungs bilaterally. 4. Aortic atherosclerosis, in addition to left main and 3 vessel coronary artery disease. 5. There are calcifications of the aortic valve. Echocardiographic correlation for evaluation of potential valvular dysfunction may be warranted if clinically indicated. Aortic Atherosclerosis (ICD10-I70.0). Electronically Signed   By: Vinnie Langton M.D.   On: 03/09/2021 08:01   CT Shoulder Right Wo Contrast  Result Date: 03/08/2021 CLINICAL DATA:  Known proximal right humeral fracture EXAM: CT OF THE UPPER RIGHT EXTREMITY WITHOUT CONTRAST TECHNIQUE: Multidetector CT imaging of the upper right extremity was performed according to the standard protocol. COMPARISON:  Plain film from earlier in the same day. FINDINGS: Bones/Joint/Cartilage Degenerative changes of the cervical and thoracic spine are noted. Visualized ribcage demonstrates some old healed rib fractures on the right. The scapula and clavicle are within normal limits. The proximal humerus demonstrates a comminuted fracture involving primarily the surgical neck with impaction at the fracture site. No dislocation is seen. No other focal bony abnormality is noted. Ligaments Suboptimally assessed by CT. Muscles and Tendons Surrounding musculature appears within normal limits. Rotator cuff as visualized appears intact. Soft tissues Lungs demonstrate evidence of a right-sided pleural effusion and mild emphysematous changes. Surrounding soft tissue structures show no acute abnormality. IMPRESSION: Comminuted fracture involving primarily the surgical neck of the proximal right humerus. Some impaction at the fracture site is noted. Right-sided pleural effusion and emphysematous changes. Electronically Signed   By: Inez Catalina M.D.   On: 03/08/2021  22:18   DG Knee Complete 4 Views Left  Result Date: 03/08/2021 CLINICAL DATA:  Recent fall with left knee pain, initial encounter EXAM: LEFT KNEE - COMPLETE 4+ VIEW COMPARISON:  None. FINDINGS: No evidence of fracture, dislocation, or joint effusion. No evidence of arthropathy or other focal bone abnormality. Soft tissues are unremarkable. IMPRESSION: No acute abnormality noted. Electronically Signed   By: Inez Catalina M.D.   On: 03/08/2021 18:55   DG Hand Complete Right  Result Date: 03/08/2021 CLINICAL DATA:  Pain after fall EXAM: RIGHT HAND - COMPLETE 3+ VIEW COMPARISON:  None. FINDINGS: There is no evidence of fracture or dislocation. There is no evidence of arthropathy. Irregular shortening of the fifth metacarpal, favor congenital versus sequela prior trauma. Diffuse demineralization of bone. Soft tissues are unremarkable. IMPRESSION: No acute osseous abnormality in the right hand. Electronically Signed   By: Dahlia Bailiff MD   On: 03/08/2021 18:56   DG Foot Complete Right  Result Date: 03/08/2021 CLINICAL DATA:  87 year old female with fall and right foot pain. EXAM: RIGHT FOOT COMPLETE - 3+ VIEW COMPARISON:  None. FINDINGS:  There is a minimally displaced avulsion fracture of the base of the fifth metatarsal. No other acute fracture identified. The bones are osteopenic. There is no dislocation. Mild soft tissue swelling of the dorsum of the foot. No radiopaque foreign object or soft tissue gas. IMPRESSION: Avulsion fracture of the base of the fifth metatarsal. Electronically Signed   By: Anner Crete M.D.   On: 03/08/2021 20:31      Scheduled Meds:  amLODipine  1.25 mg Oral Daily   aspirin EC  81 mg Oral Daily   donepezil  10 mg Oral QPM   levothyroxine  75 mcg Oral Q0600   metoprolol succinate  25 mg Oral Daily   rosuvastatin  40 mg Oral QHS   Continuous Infusions:  methocarbamol (ROBAXIN) IV Stopped (03/09/21 0644)     LOS: 2 days      Time spent: 25 minutes    Dessa Phi, DO [facility] 03/10/2021, 12:12 PM   Available via Epic secure chat 7am-7pm After these hours, please refer to coverage provider listed on [email or web address]

## 2021-03-10 NOTE — Care Management Important Message (Signed)
Important Message  Patient Details IM Letter placed in Patient's room. Name: Blakelyn Dinges MRN: [id number] Date of Birth: Oct 11, 1931   Medicare Important Message Given:  Yes     Kerin Salen 03/10/2021, 11:01 AM

## 2021-03-10 NOTE — NC FL2 (Signed)
[place] LEVEL OF CARE SCREENING TOOL     IDENTIFICATION  Patient Name: Jasmin Crane Birthdate: 1932/05/29 Sex: female Admission Date (Current Location): 03/08/2021  [facility] and [place] Number:  Herbalist and Address:  [facility],  [place], [place]      Provider Number: [id number]  Attending Physician Name and Address:  Dessa Phi, DO  Relative Name and Phone Number:  Nancy Nordmann (Daughter)   [phone]    Current Level of Care: Hospital Recommended Level of Care: [place] Prior Approval Number:    Date Approved/Denied:   PASRR Number: [id number]  Discharge Plan: SNF    Current Diagnoses: Patient Active Problem List   Diagnosis Date Noted   Emphysema lung ([place]) 03/09/2021   Pleural effusion 03/09/2021   Cardiomegaly 03/09/2021   Acute respiratory failure with hypoxia ([place]) 03/08/2021   Educated about COVID-19 virus infection 05/30/2020   Dyslipidemia 05/28/2019   Medication management 06/10/2018   Other fatigue 06/10/2018   PVD (peripheral vascular disease) ([place]) 06/10/2018   Right lower quadrant abdominal pain 06/10/2018   AAA (abdominal aortic aneurysm) without rupture ([place]) 06/10/2018   Aortic valve regurgitation 06/10/2018   Carotid artery disease ([place]) 03/06/2017   Claudication in peripheral vascular disease ([place]) 03/06/2017   Essential hypertension 03/06/2017   Hyperlipidemia 03/06/2017   Coronary artery disease due to lipid rich plaque 03/06/2017    Orientation RESPIRATION BLADDER Height & Weight     Self, Time, Situation, Place  O2 (2L [place]) External catheter Weight: 46.7 kg Height:  5\' 2"  (157.5 cm)  BEHAVIORAL SYMPTOMS/MOOD NEUROLOGICAL BOWEL NUTRITION STATUS   (none)  (none) Continent Diet (see d/c summary)  AMBULATORY STATUS COMMUNICATION OF NEEDS Skin   Extensive Assist Verbally Normal                       Personal Care Assistance Level of Assistance   Bathing, Feeding, Dressing Bathing Assistance: Maximum assistance Feeding assistance: Independent Dressing Assistance: Limited assistance     Functional Limitations Info  Sight, Hearing, Speech Sight Info: Adequate Hearing Info: Adequate Speech Info: Adequate    SPECIAL CARE FACTORS FREQUENCY  PT (By licensed PT), OT (By licensed OT)     PT Frequency: 5X/W OT Frequency: 5X/W            Contractures Contractures Info: Not present    Additional Factors Info  Code Status, Allergies Code Status Info: full Allergies Info: Cozaar, Pletaal, Shellfish           Current Medications (03/10/2021):  This is the current hospital active medication list Current Facility-Administered Medications  Medication Dose Route Frequency Provider Last Rate Last Admin   acetaminophen (TYLENOL) tablet 650 mg  650 mg Oral Q6H PRN Dessa Phi, DO   650 mg at 03/10/21 0930   amLODipine (NORVASC) tablet 1.25 mg  1.25 mg Oral Daily Toy Baker, MD       aspirin EC tablet 81 mg  81 mg Oral Daily Dessa Phi, DO   81 mg at 03/10/21 0930   donepezil (ARICEPT) tablet 10 mg  10 mg Oral QPM Dessa Phi, DO   10 mg at 03/09/21 1809   HYDROcodone-acetaminophen (NORCO/VICODIN) 5-325 MG per tablet 1 tablet  1 tablet Oral Q6H PRN Dessa Phi, DO       ketorolac (TORADOL) 15 MG/ML injection 15 mg  15 mg Intravenous Q8H PRN Dessa Phi, DO   15 mg at 03/09/21 1810   levothyroxine (  SYNTHROID) tablet 75 mcg  75 mcg Oral Q0600 Toy Baker, MD   75 mcg at 03/10/21 0727   methocarbamol (ROBAXIN) tablet 500 mg  500 mg Oral Q6H PRN Toy Baker, MD   500 mg at 03/10/21 1337   Or   methocarbamol (ROBAXIN) 500 mg in dextrose 5 % 50 mL IVPB  500 mg Intravenous Q6H PRN Toy Baker, MD   Stopped at 03/09/21 0644   metoprolol succinate (TOPROL-XL) 24 hr tablet 25 mg  25 mg Oral Daily Doutova, Anastassia, MD       morphine 2 MG/ML injection 0.5 mg  0.5 mg Intravenous Q4H PRN Dessa Phi, DO       rosuvastatin (CRESTOR) tablet 40 mg  40 mg Oral QHS Toy Baker, MD   40 mg at 03/09/21 2148     Discharge Medications: Please see discharge summary for a list of discharge medications.  Relevant Imaging Results:  Relevant Lab Results:   Additional Information [place], [place]

## 2021-03-10 NOTE — Evaluation (Signed)
Occupational Therapy Evaluation Patient Details Name: Jasmin Crane MRN: [id number] DOB: [birth year] Today's Date: 03/10/2021    History of Present Illness Patient is a 87 year old female who presented to the hospital 03/08/21 after a fall. Dx of R proximal humerus fx, R 5th metatarsal fx, hypoxia. Pt with past medical history significant for hypertension, hyperlipidemia, AAA, carotid artery occlusion status post repair in 1996, dementia on Aricept.   Clinical Impression   Patient is a pleasant 87 year old female who was admitted for above diagnosis. Patient was noted to have increased pain in R shoulder 6/10 with positioning in sling to reduce edema risk. Patient required modA  for supine to sit on edge of bed with [facility] raised with increased time and education on each step of process. Patient reported having fear of falling. Patients concerns were heard and validated. Patient was educated on how to complete transfers with assistance from therapist. Patient verbalized understanding. Patient required mod A for sit to stand with guarding BLE to keep feet from sliding on floor with patient leaning back against bed.  in place.patient required mod a  to transfer from edge of bed to recliner with increased time and cues for BLE placement.  Patient's nurse was educated on proper pillow positioning for in chair and in bed. Patients nurse verbalized understanding.Patient would continue to benefit from skilled OT services at this time while admitted and after d/c to address noted deficits in order to improve overall safety and independence in ADLs.       Follow Up Recommendations  SNF    Equipment Recommendations  Other (comment) (defer to next venue)    Recommendations for Other Services       Precautions / Restrictions Precautions Precautions: Fall Type of Shoulder Precautions: NWB RUE Shoulder Interventions: Shoulder sling/immobilizer Restrictions Weight Bearing Restrictions: Yes RUE Weight  Bearing: Non weight bearing RLE Weight Bearing: Weight bearing as tolerated (with surgical shoe in place)       Mobility Bed Mobility Overal bed mobility: Needs Assistance Bed Mobility: Supine to Sit     Supine to sit: Mod assist     General bed mobility comments: with increased time and education on proper positioning of RUE    Transfers   Equipment used: 1 person hand held assist             General transfer comment: Patient required incresed time and education on process.    Balance Overall balance assessment: Needs assistance Sitting-balance support: Single extremity supported Sitting balance-Leahy Scale: Fair     Standing balance support: Single extremity supported Standing balance-Leahy Scale: Poor Standing balance comment: patient was noted to have some posterior leaning on bed in standing.                           ADL either performed or assessed with clinical judgement   ADL Overall ADL's : Needs assistance/impaired Eating/Feeding: Set up;Sitting   Grooming: Set up;Minimal assistance;Sitting   Upper Body Bathing: Moderate assistance;Sitting   Lower Body Bathing: Moderate assistance;Sit to/from stand   Upper Body Dressing : Sitting;Maximal assistance Upper Body Dressing Details (indicate cue type and reason): while maintaining NWB and no ROM Lower Body Dressing: Maximal assistance;Sit to/from stand   Toilet Transfer: Moderate assistance Toilet Transfer Details (indicate cue type and reason): LUE hand held transfer Toileting- Clothing Manipulation and Hygiene: Maximal assistance;Sit to/from stand       Functional mobility during ADLs: Moderate assistance;Cueing for safety  Vision Baseline Vision/History: Wears glasses Wears Glasses: At all times Patient Visual Report: No change from baseline       Perception     Praxis      Pertinent Vitals/Pain Pain Assessment: 0-10 Pain Score: 2  Faces Pain Scale: Hurts even  more Pain Location: right arm Pain Descriptors / Indicators: Grimacing;Guarding Pain Intervention(s): Limited activity within patient's tolerance;Monitored during session     Hand Dominance Right   Extremity/Trunk Assessment Upper Extremity Assessment Upper Extremity Assessment: RUE deficits/detail RUE Deficits / Details: NWB in sling at this time with humeral fracture.   Lower Extremity Assessment Lower Extremity Assessment: Defer to PT evaluation   Cervical / Trunk Assessment Cervical / Trunk Assessment: Normal   Communication Communication Communication: No difficulties   Cognition Arousal/Alertness: Awake/alert Behavior During Therapy: WFL for tasks assessed/performed Overall Cognitive Status: Within Functional Limits for tasks assessed                                     General Comments       Exercises     Shoulder Instructions      Home Living Family/patient expects to be discharged to:: Private residence Living Arrangements: Alone Available Help at Discharge: Available PRN/intermittently;Family Type of Home: House Home Access: Stairs to enter [employer] of Steps: 1 step Entrance Stairs-Rails: None Home Layout: One level     Bathroom Shower/Tub: Teacher, early years/pre: Standard     Home Equipment: None          Prior Functioning/Environment Level of Independence: Independent        Comments: 1 step to enter with no rail        OT Problem List: Decreased strength;Impaired balance (sitting and/or standing);Decreased knowledge of precautions;Pain;Decreased range of motion;Decreased safety awareness;Decreased activity tolerance;Decreased coordination;Impaired UE functional use      OT Treatment/Interventions: Self-care/ADL training;Balance training;DME and/or AE instruction;Therapeutic activities;Therapeutic exercise;Energy conservation    OT Goals(Current goals can be found in the care plan section) Acute  Rehab OT Goals Patient Stated Goal: wants to be able to go home. OT Goal Formulation: With patient Time For Goal Achievement: 03/24/21 Potential to Achieve Goals: Good  OT Frequency: Min 2X/week   Barriers to D/C:    patient lives at home alone. family is not able to offer 24/7 support.       Co-evaluation              AM-PAC OT "6 Clicks" Daily Activity     Outcome Measure Help from another person eating meals?: A Little Help from another person taking care of personal grooming?: A Little Help from another person toileting, which includes using toliet, bedpan, or urinal?: A Lot Help from another person bathing (including washing, rinsing, drying)?: A Lot Help from another person to put on and taking off regular upper body clothing?: A Lot Help from another person to put on and taking off regular lower body clothing?: A Lot 6 Click Score: 14   End of Session Equipment Utilized During Treatment: Gait belt Nurse Communication: Other (comment) (nurse made aware of transfer status and positionig for RUE when in bed and in chair.)  Activity Tolerance: Patient tolerated treatment well Patient left: in chair;with call bell/phone within reach;with family/visitor present  OT Visit Diagnosis: Unsteadiness on feet (R26.81);Muscle weakness (generalized) (M62.81);Pain Pain - Right/Left: Right Pain - part of body: Shoulder;Arm  Time: 2072-1828 OT Time Calculation (min): 51 min Charges:  OT General Charges $OT Visit: 1 Visit OT Evaluation $OT Eval Moderate Complexity: 1 Mod OT Treatments $Self Care/Home Management : 23-37 mins  Jackelyn Poling OTR/L, MS Acute Rehabilitation Department Office# [phone] Pager# [phone]   [place] 03/10/2021, 12:09 PM

## 2021-03-10 NOTE — TOC Initial Note (Signed)
Transition of Care [facility]) - Initial/Assessment Note    Patient Details  Name: Jasmin Crane MRN: [id number] Date of Birth: [birth year]  [facility] CM/SW Contact:    Trish Mage, LCSW Phone Number: 03/10/2021, 2:48 PM  Clinical Narrative:   Patient seen in follow up to PT/OT recommendation of SNF.  Met with patient who resides by herself here in [place], daughter was also in room.  Bed search process explained, all questions answered. Bed search initiated. TOC will continue to follow during the course of hospitalization.                 Expected Discharge Plan: Skilled Nursing Facility Barriers to Discharge: SNF Pending bed offer   Patient Goals and CMS Choice     Choice offered to / list presented to : Patient, Adult Children  Expected Discharge Plan and Services Expected Discharge Plan: [place]   Discharge Planning Services: CM Consult Post Acute Care Choice: [place] Living arrangements for the past 2 months: Single Family Home                                      Prior Living Arrangements/Services Living arrangements for the past 2 months: Single Family Home Lives with:: Self Patient language and need for interpreter reviewed:: Yes        Need for Family Participation in Patient Care: Yes (Comment) Care giver support system in place?: Yes (comment) Current home services: DME Criminal Activity/Legal Involvement Pertinent to Current Situation/Hospitalization: No - Comment as needed  Activities of Daily Living Home Assistive Devices/Equipment: Dentures (specify type), Eyeglasses (upper and lower partial plates) ADL Screening (condition at time of admission) Patient's cognitive ability adequate to safely complete daily activities?: Yes Is the patient deaf or have difficulty hearing?: No Does the patient have difficulty seeing, even when wearing glasses/contacts?: No Does the patient have difficulty concentrating,  remembering, or making decisions?: No Patient able to express need for assistance with ADLs?: Yes Does the patient have difficulty dressing or bathing?: Yes Independently performs ADLs?: No Communication: Independent Dressing (OT): Needs assistance Is this a change from baseline?: Change from baseline, expected to last >3 days Grooming: Independent Feeding: Independent Bathing: Needs assistance Is this a change from baseline?: Change from baseline, expected to last >3 days Toileting: Needs assistance Is this a change from baseline?: Change from baseline, expected to last >3days In/Out Bed: Needs assistance Is this a change from baseline?: Change from baseline, expected to last >3 days Walks in Home: Needs assistance Is this a change from baseline?: Change from baseline, expected to last >3 days Does the patient have difficulty walking or climbing stairs?: Yes Weakness of Legs: Right Weakness of Arms/Hands: Right  Permission Sought/Granted Permission sought to share information with : Family Supports Permission granted to share information with : Yes, Verbal Permission Granted  Share Information with NAME: Nancy Nordmann (Daughter)   [phone]           Emotional Assessment Appearance:: Appears stated age Attitude/Demeanor/Rapport: Engaged Affect (typically observed): Appropriate Orientation: : Oriented to Self, Oriented to Place, Oriented to  Time, Oriented to Situation Alcohol / Substance Use: Not Applicable Psych Involvement: No (comment)  Admission diagnosis:  Hypoxia [R09.02] Acute respiratory failure with hypoxia ([place]) [J96.01] Closed fracture of right foot, initial encounter [S92.901A] Fall in home, initial encounter [W19.XXXA, Y92.009] Closed fracture of proximal end of right humerus, unspecified fracture  morphology, initial encounter [S42.201A] Patient Active Problem List   Diagnosis Date Noted   Emphysema lung ([place]) 03/09/2021   Pleural effusion 03/09/2021    Cardiomegaly 03/09/2021   Acute respiratory failure with hypoxia ([place]) 03/08/2021   Educated about COVID-19 virus infection 05/30/2020   Dyslipidemia 05/28/2019   Medication management 06/10/2018   Other fatigue 06/10/2018   PVD (peripheral vascular disease) ([place]) 06/10/2018   Right lower quadrant abdominal pain 06/10/2018   AAA (abdominal aortic aneurysm) without rupture ([place]) 06/10/2018   Aortic valve regurgitation 06/10/2018   Carotid artery disease ([place]) 03/06/2017   Claudication in peripheral vascular disease ([place]) 03/06/2017   Essential hypertension 03/06/2017   Hyperlipidemia 03/06/2017   Coronary artery disease due to lipid rich plaque 03/06/2017   PCP:  Hayden Rasmussen, MD Pharmacy:   [facility] [place], [place] - [address] [place] [address] [place] [address] Phone: [phone] Fax: [phone]     Social Determinants of Health ([place]) Interventions    Readmission Risk Interventions No flowsheet data found.

## 2021-03-10 NOTE — Evaluation (Signed)
Clinical/Bedside Swallow Evaluation Patient Details  Name: Jasmin Crane MRN: [id number] Date of Birth: 1932/06/13  Today's Date: 03/10/2021 Time: SLP Start Time (ACUTE ONLY): 4742 SLP Stop Time (ACUTE ONLY): 1005 SLP Time Calculation (min) (ACUTE ONLY): 18 min  Past Medical History:  Past Medical History:  Diagnosis Date   Arrhythmia    Cancer ([place])    skin cancer 2005   Carotid artery occlusion    Claudication of lower extremity ([place])    Coronary artery disease    Diverticulitis    Hyperlipidemia    Hypertension    Hypothyroidism    Myopathy    Paralysis of tongue    Sick sinus syndrome ([place])    Past Surgical History:  Past Surgical History:  Procedure Laterality Date   APPENDECTOMY     CAROTID ENDARTERECTOMY     CATARACT EXTRACTION, BILATERAL     CESAREAN SECTION     COLONOSCOPY     CORONARY ANGIOPLASTY     CORONARY STENT PLACEMENT     right coronary artery 1998   INGUINAL HERNIA REPAIR Right 02/25/2019   Procedure: RIGHT INGUINAL HERNIA REPAIR WITH MESH;  Surgeon: Donnie Mesa, MD;  Location: [facility];  Service: General;  Laterality: Right;  LMA AND TAP BLOCK   MULTIPLE TOOTH EXTRACTIONS     SALIVARY GLAND SURGERY     removed 1998   SALPINGECTOMY     SKIN CANCER EXCISION     2005   HPI:  87 yo adm after fall with subsequent resp failure, AAA, right humerus and right foot fx to f/u with ortho. Swallow evaluation ordered.  Pt with significant pain and was asleep upon attempt to see her ydsterday - rapid response called due to pt's mentation.   MRI brain negative.  CT chest with layering effusions and ? CHF.   Assessment / Plan / Recommendation Clinical Impression  Pt with functional oropharyngeal swallow despite left lateral lingual paralysis and sensory deficits (due to having nerve severed from surgery approx 30? or 3 ? years per pt).  Her articulation is clear without dysarthria - lingual deviation noted to left with sensory impairment.  Pt denies incidents of  lingual biting nor lateral sulci pocketing.  Pt easily passed 3 ounce [place] without difficulties.  Belching noted post liquid swallow x2 but pt denies reflux. Recommend continue diet. SLP reviewed potential compensation strategies if dysphagia presents with acute illness due to her paralysis.  Resolution of AMS has normalized swallow. Thanks for consult. SLP Visit Diagnosis: Dysphagia, unspecified (R13.10)    Aspiration Risk  Mild aspiration risk    Diet Recommendation Regular;Thin liquid   Liquid Administration via: Cup;Straw Medication Administration: Whole meds with liquid Supervision: Patient able to self feed Compensations: Slow rate;Small sips/bites    Other  Recommendations Oral Care Recommendations: Oral care BID   Follow up Recommendations None      Frequency and Duration   [name]         Prognosis   [name]     Swallow Study   General Date of Onset: 03/10/21 HPI: 87 yo adm after fall with subsequent resp failure, AAA, right humerus and right foot fx to f/u with ortho. Swallow evaluation ordered.  Pt with significant pain and was asleep upon attempt to see her ydsterday - rapid response called due to pt's mentation.   MRI brain negative.  CT chest with layering effusions and ? CHF. Type of Study: Bedside Swallow Evaluation Previous Swallow Assessment: none, pt denies dysphagia Diet Prior to this  Study: Regular;Thin liquids Temperature Spikes Noted: No Respiratory Status: Nasal cannula History of Recent Intubation: No Behavior/Cognition: Alert;Cooperative;Pleasant mood Oral Cavity Assessment: Within Functional Limits Oral Care Completed by SLP: No Oral Cavity - Dentition: Adequate natural dentition Vision: Functional for self-feeding Self-Feeding Abilities: Able to feed self Patient Positioning: Upright in bed Baseline Vocal Quality: Normal Volitional Cough:  (DNT due to pain) Volitional Swallow: Able to elicit    Oral/Motor/Sensory Function Overall Oral Motor/Sensory  Function:  (lingual paralysis of left tongue due to surgery approximately 30 years ago per pt and her daughter, [name], however articulation is clear)   Ice Chips Ice chips: Not tested   Thin Liquid Thin Liquid: Within functional limits Presentation: Cup;Self Fed    Nectar Thick Nectar Thick Liquid: Not tested   Honey Thick Honey Thick Liquid: Not tested   Puree Puree: Not tested   Solid     Solid: Within functional limits Presentation: Self Fredirick Lathe 03/10/2021,11:26 AM  Kathleen Lime, MS [place] Office [phone] Pager [phone]

## 2021-03-10 NOTE — Progress Notes (Signed)
  Echocardiogram 2D Echocardiogram has been performed.  Jasmin Crane 03/10/2021, 2:18 PM

## 2021-03-11 LAB — BASIC METABOLIC PANEL
Anion gap: 8 (ref 5–15)
BUN: 15 mg/dL (ref 8–23)
CO2: 27 mmol/L (ref 22–32)
Calcium: 8.7 mg/dL — ABNORMAL LOW (ref 8.9–10.3)
Chloride: 100 mmol/L (ref 98–111)
Creatinine, Ser: 0.7 mg/dL (ref 0.44–1.00)
GFR, Estimated: >60 mL/min (ref >60)
Glucose, Bld: 96 mg/dL (ref 70–99)
Potassium: 3.5 mmol/L (ref 3.5–5.1)
Sodium: 135 mmol/L (ref 135–145)

## 2021-03-11 LAB — RESP PANEL BY RT-PCR (FLU A&B, COVID) ARPGX2
Influenza A by PCR: NEGATIVE
Influenza B by PCR: NEGATIVE
SARS Coronavirus 2 by RT PCR: NEGATIVE

## 2021-03-11 LAB — SARS CORONAVIRUS 2 (TAT 6-24 HRS): SARS Coronavirus 2: NEGATIVE

## 2021-03-11 MED ORDER — AMLODIPINE BESYLATE 2.5 MG PO TABS: 1.2500 mg | ORAL_TABLET | Freq: Every day | ORAL | 0 refills | Status: DC

## 2021-03-11 MED ORDER — METHOCARBAMOL 500 MG PO TABS: 500.0000 mg | ORAL_TABLET | Freq: Four times a day (QID) | ORAL | 0 refills | Status: DC | PRN

## 2021-03-11 MED ORDER — HYDROCODONE-ACETAMINOPHEN 5-325 MG PO TABS: 1.0000 | ORAL_TABLET | Freq: Four times a day (QID) | ORAL | 0 refills | Status: DC | PRN

## 2021-03-11 MED ORDER — ACETAMINOPHEN 325 MG PO TABS
650.0000 mg | ORAL_TABLET | Freq: Four times a day (QID) | ORAL | Status: DC | PRN
Start: 2021-03-11 — End: 2022-03-14

## 2021-03-11 NOTE — Clinical Social Work Note (Signed)
Jasmin Crane, Darcey #[id number] (CSN: [id number]) (87 y.o. F) (Adm: 03/08/21) [id number]  Microbiology Results  Procedure Component Value Units Date/Time  Resp Panel by RT-PCR (Flu A&B, Covid) Nasopharyngeal Swab [[id number]] Collected: 03/11/21 1133  Order Status: Completed Specimen: Nasopharyngeal(NP) swabs in vial transport medium from Nasopharyngeal Swab Updated: 03/11/21 1253   SARS Coronavirus 2 by RT PCR NEGATIVE   Comment: (NOTE)  SARS-CoV-2 target nucleic acids are NOT DETECTED.   The SARS-CoV-2 RNA is generally detectable in upper respiratory  specimens during the acute phase of infection. The lowest  concentration of SARS-CoV-2 viral copies this assay can detect is  138 copies/mL. A negative result does not preclude SARS-Cov-2  infection and should not be used as the sole basis for treatment or  other patient management decisions. A negative result may occur with  improper specimen collection/handling, submission of specimen other  than nasopharyngeal swab, presence of viral mutation(s) within the  areas targeted by this assay, and inadequate number of viral  copies(<138 copies/mL). A negative result must be combined with  clinical observations, patient history, and epidemiological  information. The expected result is Negative.   Fact Sheet for Patients:  EntrepreneurPulse.com.au   Fact Sheet for Healthcare Providers:  IncredibleEmployment.be   This test is not yet approved or cleared by the [place] FDA and  has been authorized for detection and/or diagnosis of SARS-CoV-2 by  FDA under an Emergency Use Authorization (EUA). This EUA will remain  in effect (meaning this test can be used) for the duration of the  COVID-19 declaration under Section 564(b)(1) of the Act, 21  U.S.C.section 360bbb-3(b)(1), unless the authorization is terminated  or revoked sooner.        Influenza A by PCR NEGATIVE   Influenza B by PCR NEGATIVE    Comment: (NOTE)  The Xpert Xpress SARS-CoV-2/FLU/RSV plus assay is intended as an aid  in the diagnosis of influenza from Nasopharyngeal swab specimens and  should not be used as a sole basis for treatment. Nasal washings and  aspirates are unacceptable for Xpert Xpress SARS-CoV-2/FLU/RSV  testing.   Fact Sheet for Patients:  EntrepreneurPulse.com.au   Fact Sheet for Healthcare Providers:  IncredibleEmployment.be   This test is not yet approved or cleared by the [place] FDA and  has been authorized for detection and/or diagnosis of SARS-CoV-2 by  FDA under an Emergency Use Authorization (EUA). This EUA will remain  in effect (meaning this test can be used) for the duration of the  COVID-19 declaration under Section 564(b)(1) of the Act, 21 U.S.C.  section 360bbb-3(b)(1), unless the authorization is terminated or  revoked.   Performed at [facility], [place] [address]  [place], [place] [address]

## 2021-03-11 NOTE — Discharge Summary (Addendum)
Physician Discharge Summary  Jasmin Crane HYQ:[id number] DOB: 09-28-1931 DOA: 03/08/2021  PCP: Hayden Rasmussen, MD  Admit date: 03/08/2021 Discharge date: 03/11/2021  Admitted From: Home Disposition: SNF  Recommendations for Outpatient Follow-up:  Follow-up with PCP after discharge from SNF Follow-up with cardiology after discharge from SNF Follow-up with orthopedic surgery in 1 week  Home Health: No Equipment/Devices: No Discharge Condition: Stable CODE STATUS: Full code Diet recommendation: Low-sodium diet  Brief/Interim Summary: Jasmin Crane is an 87 year old female with past medical history significant for hypertension, hyperlipidemia, AAA, carotid artery occlusion status post repair in 1996, dementia on Aricept, hypothyroidism who presents to the hospital after a fall.  She reports that she was trying to get up to use the bathroom, fell as she thinks she probably slipped or slid.  She denies any loss of consciousness or passing out.  In the emergency department, she was found to have right proximal humerus fracture, right fifth metatarsal fracture.  Patient was also incidentally found to be satting at 87% on room air and was started on 2 L oxygen.  Orthopedic surgery was consulted who recommended CT of shoulder and to follow-up with Dr. Stann Mainland in 1 week as outpatient.  Had an episode of confusion, agitation on afternoon of 7/13 thought to be medication induced in setting of undiagnosed dementia.  Fall with resultant right humerus fracture, right foot fracture -CT right shoulder: Comminuted fracture involving primarily the surgical neck of the proximal right humerus. Some impaction at the fracture site is noted. -Xray right foot: Avulsion fracture of the base of the fifth metatarsal. -Tylenol and Vicodin as needed for pain control. -Orthopedic surgery has recommended outpatient follow-up in 1 week with Dr. Stann Mainland, continued right upper extremity sling. -PT OT recommended SNF.    Acute hypoxemic respiratory failure -Acute diastolic CHF: -Small bilateral pleural effusion -Resolved.  Maintaining oxygen saturation on room air this morning. -CTA chest negative for PE, but did reveal cardiomegaly with mild interstitial pulmonary edema and small bilateral pleural effusions -Echo: Shows ejection fraction of 55 to 95%, grade 2 diastolic dysfunction. -Patient initially started on Lasix and then discontinued as she appeared euvolemic on exam. -Follow-up with cardiology outpatient.   Peripheral artery disease Hyperlipidemia Hypertension Coronary artery disease Hypothyroidism: -Remained stable on home medications.  AKI:  Resolved.  Creatinine back to baseline  Likely underlying dementia -Had episode of confusion, agitation -CT head showed moderate atrophy, chronic microvascular ischemia, no acute intracranial abnormality -She is back to her baseline  Discharge Diagnoses:  Fall with right humerus fracture, right foot fracture Acute hypoxemic respiratory failure Acute diastolic CHF Small bilateral pleural effusion AKI Peripheral arterial disease Hyperlipidemia Hypertension Coronary artery disease Hypothyroidism Possible dementia    Discharge Instructions  Discharge Instructions     Diet - low sodium heart healthy   Complete by: As directed    Discharge instructions   Complete by: As directed    Follow-up with PCP after discharge from the nursing home Follow-up with cardiology after discharge from the nursing home Follow-up with orthopedic surgery in 1 week   Increase activity slowly   Complete by: As directed       Allergies as of 03/11/2021       Reactions   Cozaar [losartan] Nausea Only   Pletaal [cilostazol] Palpitations   Scallops [shellfish Allergy] Swelling        Medication List     STOP taking these medications    Clobetasol Prop Emollient Base 0.05 % emollient cream Commonly known as: Clobetasol  Propionate E       TAKE  these medications    acetaminophen 325 MG tablet Commonly known as: TYLENOL Take 2 tablets (650 mg total) by mouth every 6 (six) hours as needed for mild pain, fever or headache.   amLODipine 2.5 MG tablet Commonly known as: NORVASC Take 0.5 tablets (1.25 mg total) by mouth daily.   aspirin EC 81 MG tablet Take 81 mg by mouth daily.   citalopram 10 MG tablet Commonly known as: CELEXA Take 10 mg by mouth daily.   donepezil 10 MG tablet Commonly known as: ARICEPT Take 10 mg by mouth every evening.   HYDROcodone-acetaminophen 5-325 MG tablet Commonly known as: NORCO/VICODIN Take 1 tablet by mouth every 6 (six) hours as needed for moderate pain.   levothyroxine 75 MCG tablet Commonly known as: SYNTHROID Take 75 mcg by mouth daily before breakfast.   methocarbamol 500 MG tablet Commonly known as: ROBAXIN Take 1 tablet (500 mg total) by mouth every 6 (six) hours as needed for muscle spasms.   metoprolol succinate 25 MG 24 hr tablet Commonly known as: TOPROL-XL TAKE 1 TABLET BY MOUTH EVERY DAY   rosuvastatin 40 MG tablet Commonly known as: CRESTOR TAKE 1 TABLET BY MOUTH AT BEDTIME        Follow-up Information     Hayden Rasmussen, MD Follow up.   Specialty: Family Medicine Contact information: [place] [address] [phone]         Minus Breeding, MD .   Specialty: Cardiology Contact information: [address] [place] [address] [phone]                Allergies  Allergen Reactions   Cozaar [Losartan] Nausea Only   Pletaal [Cilostazol] Palpitations   Scallops [Shellfish Allergy] Swelling    Consultations:    Procedures/Studies: DG Chest 2 View  Result Date: 03/08/2021 CLINICAL DATA:  Status post fall. EXAM: CHEST - 2 VIEW COMPARISON:  None. FINDINGS: There is coarsening of the pulmonary interstitium. Lungs are clear. Cardiomegaly. No pneumothorax or pleural fluid. Aortic atherosclerosis.  No acute or focal bony abnormality. IMPRESSION: No acute disease. Cardiomegaly. Aortic Atherosclerosis (ICD10-I70.0). Electronically Signed   By: Inge Rise M.D.   On: 03/08/2021 18:55   DG Shoulder Right  Result Date: 03/08/2021 CLINICAL DATA:  Mechanical fall EXAM: RIGHT SHOULDER - 2+ VIEW COMPARISON:  None. FINDINGS: Mildly impacted comminuted fracture of the surgical neck of the humerus with intra-articular extension. With associated soft tissue swelling. Diffuse demineralization of bone. Chronic lung changes. IMPRESSION: Mildly impacted comminuted fracture of the proximal humerus with intra-articular extension. Electronically Signed   By: Dahlia Bailiff MD   On: 03/08/2021 18:52   DG Elbow Complete Right  Result Date: 03/08/2021 CLINICAL DATA:  Pain after mechanical fall EXAM: RIGHT ELBOW - COMPLETE 3+ VIEW COMPARISON:  None. FINDINGS: There is no evidence of fracture, dislocation, or joint effusion. There is no evidence of arthropathy. Diffuse demineralization of bone. Soft tissues are unremarkable. IMPRESSION: No fracture or dislocation of the right elbow. No elbow joint effusion. Electronically Signed   By: Dahlia Bailiff MD   On: 03/08/2021 18:52   DG Wrist Complete Right  Result Date: 03/08/2021 CLINICAL DATA:  Pain after mechanical fall EXAM: RIGHT WRIST - COMPLETE 3+ VIEW COMPARISON:  None. FINDINGS: There is no evidence of fracture or dislocation. There is no evidence of significant arthropathy. Diffuse demineralization of bone. Soft tissues are unremarkable. IMPRESSION: No fracture  or dislocation of the right wrist. Electronically Signed   By: Dahlia Bailiff MD   On: 03/08/2021 18:54   DG Tibia/Fibula Left  Result Date: 03/08/2021 CLINICAL DATA:  Recent fall with left lower leg pain, initial encounter EXAM: LEFT TIBIA AND FIBULA - 2 VIEW COMPARISON:  None. FINDINGS: There is no evidence of fracture or other focal bone lesions. Soft tissues are unremarkable. IMPRESSION: No acute  abnormality noted. Electronically Signed   By: Inez Catalina M.D.   On: 03/08/2021 18:56   CT HEAD WO CONTRAST  Result Date: 03/09/2021 CLINICAL DATA:  Mental status change. Delirium. Oxygen desaturation episode of encephalopathy related to urinary tract infection 3 months ago. EXAM: CT HEAD WITHOUT CONTRAST TECHNIQUE: Contiguous axial images were obtained from the base of the skull through the vertex without intravenous contrast. COMPARISON:  None. FINDINGS: Brain: Moderate atrophy and diffuse confluent periventricular white matter hypoattenuation is present bilaterally. No acute infarct, hemorrhage, or mass lesion is present. White matter changes are most pronounced posteriorly with disc proportionate posterior enlargement of the ventricles. This is worse right than left. The brainstem and cerebellum are within normal limits. Vascular: Atherosclerotic calcifications are present within the cavernous internal carotid arteries. Calcifications are present at the dural margin of the right vertebral artery. No hyperdense vessel is present. Skull: Calvarium is intact. No focal lytic or blastic lesions are present. No significant extracranial soft tissue lesion is present. Sinuses/Orbits: The paranasal sinuses and mastoid air cells are clear. Bilateral lens replacements are noted. Globes and orbits are otherwise unremarkable. IMPRESSION: 1. Moderate atrophy and diffuse confluent periventricular white matter hypoattenuation bilaterally. This likely reflects the sequela of chronic microvascular ischemia. 2. No acute intracranial abnormality. Electronically Signed   By: San Morelle M.D.   On: 03/09/2021 17:26   CT Angio Chest Pulmonary Embolism (PE) W or WO Contrast  Result Date: 03/09/2021 CLINICAL DATA:  87 year old female with high clinical suspicion of pulmonary embolism. EXAM: CT ANGIOGRAPHY CHEST WITH CONTRAST TECHNIQUE: Multidetector CT imaging of the chest was performed using the standard protocol  during bolus administration of intravenous contrast. Multiplanar CT image reconstructions and MIPs were obtained to evaluate the vascular anatomy. CONTRAST:  19mL OMNIPAQUE IOHEXOL 350 MG/ML SOLN COMPARISON:  No priors. FINDINGS: Cardiovascular: There are no filling defects within the pulmonary arterial tree to suggest pulmonary embolism. Heart size is mildly enlarged. There is no significant pericardial fluid, thickening or pericardial calcification. There is aortic atherosclerosis, as well as atherosclerosis of the great vessels of the mediastinum and the coronary arteries, including calcified atherosclerotic plaque in the left main, left anterior descending, left circumflex and right coronary arteries. Thickening and calcification of the aortic valve. Mediastinum/Nodes: No pathologically enlarged mediastinal or hilar lymph nodes. Esophagus is unremarkable in appearance. No axillary lymphadenopathy. Lungs/Pleura: Small bilateral pleural effusions lying dependently with areas of passive subsegmental atelectasis in the lower lobes of the lungs bilaterally. There is a background of mild diffuse ground-glass attenuation and widespread interlobular septal thickening, suggesting interstitial pulmonary edema. No confluent consolidative airspace disease. No pleural effusions. No definite suspicious appearing pulmonary nodules or masses are noted. Scarring or subsegmental atelectasis in the medial aspect of the right upper lobe and medial segment of the right middle lobe, as well as inferior segment of the lingula. Upper Abdomen: Aortic atherosclerosis. Musculoskeletal: There are no aggressive appearing lytic or blastic lesions noted in the visualized portions of the skeleton. Review of the MIP images confirms the above findings. IMPRESSION: 1. No evidence of pulmonary embolism. 2.  Cardiomegaly with evidence of mild interstitial pulmonary edema and small bilateral pleural effusions; imaging findings suggestive of  underlying congestive heart failure. 3. Passive subsegmental atelectasis in the lower lobes of the lungs bilaterally. 4. Aortic atherosclerosis, in addition to left main and 3 vessel coronary artery disease. 5. There are calcifications of the aortic valve. Echocardiographic correlation for evaluation of potential valvular dysfunction may be warranted if clinically indicated. Aortic Atherosclerosis (ICD10-I70.0). Electronically Signed   By: Vinnie Langton M.D.   On: 03/09/2021 08:01   CT Shoulder Right Wo Contrast  Result Date: 03/08/2021 CLINICAL DATA:  Known proximal right humeral fracture EXAM: CT OF THE UPPER RIGHT EXTREMITY WITHOUT CONTRAST TECHNIQUE: Multidetector CT imaging of the upper right extremity was performed according to the standard protocol. COMPARISON:  Plain film from earlier in the same day. FINDINGS: Bones/Joint/Cartilage Degenerative changes of the cervical and thoracic spine are noted. Visualized ribcage demonstrates some old healed rib fractures on the right. The scapula and clavicle are within normal limits. The proximal humerus demonstrates a comminuted fracture involving primarily the surgical neck with impaction at the fracture site. No dislocation is seen. No other focal bony abnormality is noted. Ligaments Suboptimally assessed by CT. Muscles and Tendons Surrounding musculature appears within normal limits. Rotator cuff as visualized appears intact. Soft tissues Lungs demonstrate evidence of a right-sided pleural effusion and mild emphysematous changes. Surrounding soft tissue structures show no acute abnormality. IMPRESSION: Comminuted fracture involving primarily the surgical neck of the proximal right humerus. Some impaction at the fracture site is noted. Right-sided pleural effusion and emphysematous changes. Electronically Signed   By: Inez Catalina M.D.   On: 03/08/2021 22:18   DG Knee Complete 4 Views Left  Result Date: 03/08/2021 CLINICAL DATA:  Recent fall with left  knee pain, initial encounter EXAM: LEFT KNEE - COMPLETE 4+ VIEW COMPARISON:  None. FINDINGS: No evidence of fracture, dislocation, or joint effusion. No evidence of arthropathy or other focal bone abnormality. Soft tissues are unremarkable. IMPRESSION: No acute abnormality noted. Electronically Signed   By: Inez Catalina M.D.   On: 03/08/2021 18:55   DG Hand Complete Right  Result Date: 03/08/2021 CLINICAL DATA:  Pain after fall EXAM: RIGHT HAND - COMPLETE 3+ VIEW COMPARISON:  None. FINDINGS: There is no evidence of fracture or dislocation. There is no evidence of arthropathy. Irregular shortening of the fifth metacarpal, favor congenital versus sequela prior trauma. Diffuse demineralization of bone. Soft tissues are unremarkable. IMPRESSION: No acute osseous abnormality in the right hand. Electronically Signed   By: Dahlia Bailiff MD   On: 03/08/2021 18:56   DG Foot Complete Right  Result Date: 03/08/2021 CLINICAL DATA:  87 year old female with fall and right foot pain. EXAM: RIGHT FOOT COMPLETE - 3+ VIEW COMPARISON:  None. FINDINGS: There is a minimally displaced avulsion fracture of the base of the fifth metatarsal. No other acute fracture identified. The bones are osteopenic. There is no dislocation. Mild soft tissue swelling of the dorsum of the foot. No radiopaque foreign object or soft tissue gas. IMPRESSION: Avulsion fracture of the base of the fifth metatarsal. Electronically Signed   By: Anner Crete M.D.   On: 03/08/2021 20:31   ECHOCARDIOGRAM COMPLETE  Result Date: 03/10/2021    ECHOCARDIOGRAM REPORT   Patient Name:   Jasmin Crane Date of Exam: 03/10/2021 Medical Rec #:  [id number]     Height:       62.0 in Accession #:    [id number]    Weight:  103.0 lb Date of Birth:  1932/01/02     BSA:          1.442 m Patient Age:    87 years      BP:           104/50 mmHg Patient Gender: F             HR:           63 bpm. Exam Location:  Inpatient Procedure: 2D Echo, Cardiac Doppler and Color  Doppler Indications:    I51.7 Cardiomegaly  History:        Patient has prior history of Echocardiogram examinations. CAD,                 Aortic Valve Disease; Risk Factors:Hypertension and                 Dyslipidemia.  Sonographer:    Roseanna Rainbow RDCS Referring Phys: [place]  Sonographer Comments: Technically difficult study due to poor echo windows and suboptimal subcostal window. Patient has broken shoulder. Patient has arm sling across chest and neck. No SSN images or RSB. IMPRESSIONS  1. Left ventricular ejection fraction, by estimation, is 55 to 60%. The left ventricle has normal function. The left ventricle has no regional wall motion abnormalities. There is mild left ventricular hypertrophy. Left ventricular diastolic parameters are consistent with Grade II diastolic dysfunction (pseudonormalization).  2. Right ventricular systolic function is mildly reduced. The right ventricular size is normal. There is normal pulmonary artery systolic pressure. The estimated right ventricular systolic pressure is 75.6 mmHg.  3. Left atrial size was mildly dilated.  4. The mitral valve is normal in structure. Trivial mitral valve regurgitation. No evidence of mitral stenosis.  5. The aortic valve is tricuspid. Aortic valve regurgitation is mild. Mild aortic valve sclerosis is present, with no evidence of aortic valve stenosis. Aortic valve area, by VTI measures 1.91 cm. Aortic valve mean gradient measures 10.0 mmHg.  6. The inferior vena cava is normal in size with greater than 50% respiratory variability, suggesting right atrial pressure of 3 mmHg. FINDINGS  Left Ventricle: Left ventricular ejection fraction, by estimation, is 55 to 60%. The left ventricle has normal function. The left ventricle has no regional wall motion abnormalities. The left ventricular internal cavity size was normal in size. There is  mild left ventricular hypertrophy. Left ventricular diastolic parameters are consistent with Grade  II diastolic dysfunction (pseudonormalization). Right Ventricle: The right ventricular size is normal. No increase in right ventricular wall thickness. Right ventricular systolic function is mildly reduced. There is normal pulmonary artery systolic pressure. The tricuspid regurgitant velocity is 2.77 m/s, and with an assumed right atrial pressure of 3 mmHg, the estimated right ventricular systolic pressure is 43.3 mmHg. Left Atrium: Left atrial size was mildly dilated. Right Atrium: Right atrial size was normal in size. Pericardium: There is no evidence of pericardial effusion. Mitral Valve: The mitral valve is normal in structure. There is mild calcification of the mitral valve leaflet(s). Mild mitral annular calcification. Trivial mitral valve regurgitation. No evidence of mitral valve stenosis. Tricuspid Valve: The tricuspid valve is normal in structure. Tricuspid valve regurgitation is trivial. Aortic Valve: The aortic valve is tricuspid. Aortic valve regurgitation is mild. Aortic regurgitation PHT measures 534 msec. Mild aortic valve sclerosis is present, with no evidence of aortic valve stenosis. Aortic valve mean gradient measures 10.0 mmHg.  Aortic valve peak gradient measures 16.3 mmHg. Aortic valve area, by VTI measures 1.91  cm. Pulmonic Valve: The pulmonic valve was normal in structure. Pulmonic valve regurgitation is not visualized. Aorta: The aortic root is normal in size and structure. Venous: The inferior vena cava is normal in size with greater than 50% respiratory variability, suggesting right atrial pressure of 3 mmHg. IAS/Shunts: No atrial level shunt detected by color flow Doppler.  LEFT VENTRICLE PLAX 2D LVIDd:         3.90 cm     Diastology LVIDs:         2.60 cm     LV e' medial:    6.09 cm/s LV PW:         1.20 cm     LV E/e' medial:  11.8 LV IVS:        1.10 cm     LV e' lateral:   6.31 cm/s LVOT diam:     1.80 cm     LV E/e' lateral: 11.4 LV SV:         84 LV SV Index:   58 LVOT Area:      2.54 cm  LV Volumes (MOD) LV vol d, MOD A2C: 56.0 ml LV vol d, MOD A4C: 45.4 ml LV vol s, MOD A2C: 18.8 ml LV vol s, MOD A4C: 18.7 ml LV SV MOD A2C:     37.2 ml LV SV MOD A4C:     45.4 ml LV SV MOD BP:      35.2 ml RIGHT VENTRICLE            IVC RV S prime:     9.14 cm/s  IVC diam: 1.70 cm TAPSE (M-mode): 1.6 cm LEFT ATRIUM             Index       RIGHT ATRIUM           Index LA diam:        3.60 cm 2.50 cm/m  RA Area:     11.90 cm LA Vol (A2C):   36.2 ml 25.11 ml/m RA Volume:   28.90 ml  20.04 ml/m LA Vol (A4C):   44.6 ml 30.93 ml/m LA Biplane Vol: 40.1 ml 27.81 ml/m  AORTIC VALVE AV Area (Vmax):    2.00 cm AV Area (Vmean):   1.96 cm AV Area (VTI):     1.91 cm AV Vmax:           202.00 cm/s AV Vmean:          149.000 cm/s AV VTI:            0.438 m AV Peak Grad:      16.3 mmHg AV Mean Grad:      10.0 mmHg LVOT Vmax:         159.00 cm/s LVOT Vmean:        115.000 cm/s LVOT VTI:          0.329 m LVOT/AV VTI ratio: 0.75 AI PHT:            534 msec  AORTA Ao Root diam: 3.50 cm Ao Asc diam:  3.30 cm MITRAL VALVE               TRICUSPID VALVE MV Area (PHT): 3.45 cm    TR Peak grad:   30.7 mmHg MV Decel Time: 220 msec    TR Vmax:        277.00 cm/s MV E velocity: 71.80 cm/s MV A velocity: 49.05 cm/s  SHUNTS MV E/A ratio:  1.46  Systemic VTI:  0.33 m                            Systemic Diam: 1.80 cm Loralie Champagne MD Electronically signed by Loralie Champagne MD Signature Date/Time: 03/10/2021/5:15:05 PM    Final       Subjective: Patient seen and examined.  Lying comfortably on the bed.  Alert and oriented, appears very pleasant.  Daughter at the bedside.  Patient tells me that she feels much better this morning.  No acute events overnight.  Denies chest pain, shortness of breath, leg swelling, orthopnea or PND.  Has sling in right shoulder and splint in right foot. Comfortable going to SNF today.  Discharge Exam: Vitals:   03/11/21 0435 03/11/21 0846  BP: (!) 120/54 (!) 124/54  Pulse: 73 69  Resp:  18   Temp: 97.7 F (36.5 C)   SpO2: 99%    Vitals:   03/10/21 0442 03/10/21 2027 03/11/21 0435 03/11/21 0846  BP: (!) 104/50 (!) 136/59 (!) 120/54 (!) 124/54  Pulse: 67 70 73 69  Resp: 18 18 18    Temp: 98.6 F (37 C) (!) 97.5 F (36.4 C) 97.7 F (36.5 C)   TempSrc: Oral Oral    SpO2: 96% 95% 99%   Weight:      Height:        General: Pt is alert, awake, not in acute distress, very pleasant, on room air, communicating well Cardiovascular: RRR, S1/S2 +, no rubs, no gallops Respiratory: CTA bilaterally, no wheezing, no rhonchi Abdominal: Soft, NT, ND, bowel sounds + Extremities: no edema, no cyanosis.  Sling in right shoulder and a splint in right foot.    The results of significant diagnostics from this hospitalization (including imaging, microbiology, ancillary and laboratory) are listed below for reference.     Microbiology: Recent Results (from the past 240 hour(s))  Resp Panel by RT-PCR (Flu A&B, Covid) Nasopharyngeal Swab     Status: None   Collection Time: 03/08/21  9:51 PM   Specimen: Nasopharyngeal Swab; Nasopharyngeal(NP) swabs in vial transport medium  Result Value Ref Range Status   SARS Coronavirus 2 by RT PCR NEGATIVE NEGATIVE Final    Comment: (NOTE) SARS-CoV-2 target nucleic acids are NOT DETECTED.  The SARS-CoV-2 RNA is generally detectable in upper respiratory specimens during the acute phase of infection. The lowest concentration of SARS-CoV-2 viral copies this assay can detect is 138 copies/mL. A negative result does not preclude SARS-Cov-2 infection and should not be used as the sole basis for treatment or other patient management decisions. A negative result may occur with  improper specimen collection/handling, submission of specimen other than nasopharyngeal swab, presence of viral mutation(s) within the areas targeted by this assay, and inadequate number of viral copies(<138 copies/mL). A negative result must be combined with clinical  observations, patient history, and epidemiological information. The expected result is Negative.  Fact Sheet for Patients:  EntrepreneurPulse.com.au  Fact Sheet for Healthcare Providers:  IncredibleEmployment.be  This test is no t yet approved or cleared by the [place] FDA and  has been authorized for detection and/or diagnosis of SARS-CoV-2 by FDA under an Emergency Use Authorization (EUA). This EUA will remain  in effect (meaning this test can be used) for the duration of the COVID-19 declaration under Section 564(b)(1) of the Act, 21 U.S.C.section 360bbb-3(b)(1), unless the authorization is terminated  or revoked sooner.       Influenza A by PCR NEGATIVE  NEGATIVE Final   Influenza B by PCR NEGATIVE NEGATIVE Final    Comment: (NOTE) The Xpert Xpress SARS-CoV-2/FLU/RSV plus assay is intended as an aid in the diagnosis of influenza from Nasopharyngeal swab specimens and should not be used as a sole basis for treatment. Nasal washings and aspirates are unacceptable for Xpert Xpress SARS-CoV-2/FLU/RSV testing.  Fact Sheet for Patients: EntrepreneurPulse.com.au  Fact Sheet for Healthcare Providers: IncredibleEmployment.be  This test is not yet approved or cleared by the [place] FDA and has been authorized for detection and/or diagnosis of SARS-CoV-2 by FDA under an Emergency Use Authorization (EUA). This EUA will remain in effect (meaning this test can be used) for the duration of the COVID-19 declaration under Section 564(b)(1) of the Act, 21 U.S.C. section 360bbb-3(b)(1), unless the authorization is terminated or revoked.  Performed at [facility], [place] [address], [place], [place] [address]      Labs: BNP (last 3 results) Recent Labs    03/08/21 1930  BNP 093.2*   Basic Metabolic Panel: Recent Labs  Lab 03/08/21 1930 03/09/21 0606 03/10/21 0450 03/11/21 0441   NA 139 137 135 135  K 3.7 3.8 3.6 3.5  CL 106 102 100 100  CO2 26 24 28 27   GLUCOSE 125* 170* 88 96  BUN 15 11 18 15   CREATININE 0.90 0.72 1.14* 0.70  CALCIUM 8.9 9.1 8.6* 8.7*   Liver Function Tests: No results for input(s): AST, ALT, ALKPHOS, BILITOT, PROT, ALBUMIN in the last 168 hours. No results for input(s): LIPASE, AMYLASE in the last 168 hours. No results for input(s): AMMONIA in the last 168 hours. CBC: Recent Labs  Lab 03/08/21 1930 03/09/21 0606  WBC 4.2 8.2  NEUTROABS 2.9  --   HGB 11.7* 11.9*  HCT 37.9 38.8  MCV 95.2 94.6  PLT 150 153   Cardiac Enzymes: No results for input(s): CKTOTAL, CKMB, CKMBINDEX, TROPONINI in the last 168 hours. BNP: Invalid input(s): POCBNP CBG: No results for input(s): GLUCAP in the last 168 hours. D-Dimer No results for input(s): DDIMER in the last 72 hours. Hgb A1c No results for input(s): HGBA1C in the last 72 hours. Lipid Profile No results for input(s): CHOL, HDL, LDLCALC, TRIG, CHOLHDL, LDLDIRECT in the last 72 hours. Thyroid function studies Recent Labs    03/09/21 0606  TSH 9.010*   Anemia work up No results for input(s): VITAMINB12, FOLATE, FERRITIN, TIBC, IRON, RETICCTPCT in the last 72 hours. Urinalysis    Component Value Date/Time   COLORURINE STRAW (A) 03/10/2021 2050   APPEARANCEUR CLEAR 03/10/2021 2050   LABSPEC 1.005 03/10/2021 2050   PHURINE 6.0 03/10/2021 2050   GLUCOSEU NEGATIVE 03/10/2021 2050   [place] NEGATIVE 03/10/2021 2050   [place] NEGATIVE 03/10/2021 2050   [place] NEGATIVE 03/10/2021 2050   PROTEINUR NEGATIVE 03/10/2021 2050   NITRITE NEGATIVE 03/10/2021 2050   LEUKOCYTESUR NEGATIVE 03/10/2021 2050   Sepsis Labs Invalid input(s): PROCALCITONIN,  WBC,  LACTICIDVEN Microbiology Recent Results (from the past 240 hour(s))  Resp Panel by RT-PCR (Flu A&B, Covid) Nasopharyngeal Swab     Status: None   Collection Time: 03/08/21  9:51 PM   Specimen: Nasopharyngeal Swab; Nasopharyngeal(NP)  swabs in vial transport medium  Result Value Ref Range Status   SARS Coronavirus 2 by RT PCR NEGATIVE NEGATIVE Final    Comment: (NOTE) SARS-CoV-2 target nucleic acids are NOT DETECTED.  The SARS-CoV-2 RNA is generally detectable in upper respiratory specimens during the acute phase of infection. The lowest concentration of SARS-CoV-2  viral copies this assay can detect is 138 copies/mL. A negative result does not preclude SARS-Cov-2 infection and should not be used as the sole basis for treatment or other patient management decisions. A negative result may occur with  improper specimen collection/handling, submission of specimen other than nasopharyngeal swab, presence of viral mutation(s) within the areas targeted by this assay, and inadequate number of viral copies(<138 copies/mL). A negative result must be combined with clinical observations, patient history, and epidemiological information. The expected result is Negative.  Fact Sheet for Patients:  EntrepreneurPulse.com.au  Fact Sheet for Healthcare Providers:  IncredibleEmployment.be  This test is no t yet approved or cleared by the [place] FDA and  has been authorized for detection and/or diagnosis of SARS-CoV-2 by FDA under an Emergency Use Authorization (EUA). This EUA will remain  in effect (meaning this test can be used) for the duration of the COVID-19 declaration under Section 564(b)(1) of the Act, 21 U.S.C.section 360bbb-3(b)(1), unless the authorization is terminated  or revoked sooner.       Influenza A by PCR NEGATIVE NEGATIVE Final   Influenza B by PCR NEGATIVE NEGATIVE Final    Comment: (NOTE) The Xpert Xpress SARS-CoV-2/FLU/RSV plus assay is intended as an aid in the diagnosis of influenza from Nasopharyngeal swab specimens and should not be used as a sole basis for treatment. Nasal washings and aspirates are unacceptable for Xpert Xpress  SARS-CoV-2/FLU/RSV testing.  Fact Sheet for Patients: EntrepreneurPulse.com.au  Fact Sheet for Healthcare Providers: IncredibleEmployment.be  This test is not yet approved or cleared by the [place] FDA and has been authorized for detection and/or diagnosis of SARS-CoV-2 by FDA under an Emergency Use Authorization (EUA). This EUA will remain in effect (meaning this test can be used) for the duration of the COVID-19 declaration under Section 564(b)(1) of the Act, 21 U.S.C. section 360bbb-3(b)(1), unless the authorization is terminated or revoked.  Performed at [facility], [place] [address], [place], [place] [address]      Time coordinating discharge: Over 30 minutes  SIGNED:   Mckinley Jewel, MD  [facility] 03/11/2021, 10:54 AM Pager   If 7PM-7AM, please contact night-coverage [email or web address]

## 2021-03-11 NOTE — Progress Notes (Signed)
Physical Therapy Treatment Patient Details Name: Jasmin Crane MRN: [id number] DOB: [birth year] Today's Date: 03/11/2021    History of Present Illness Patient is a 87 year old female who presented to the hospital 03/08/21 after a fall. Dx of R proximal humerus fx, R 5th metatarsal fx, hypoxia. Pt with past medical history significant for hypertension, hyperlipidemia, AAA, carotid artery occlusion status post repair in 1996, dementia on Aricept.    PT Comments    Pt is progressing toward acute PT goals with ambulation total of 75ft this session with MIN A for stability. Trialed quad cane for ~32ft, pt still requiring Min A for stability and cues for sequencing and quad cane management. Pt continues to be limited with all mobility by pain/fatigue. Pt will benefit from skilled PT to increase their independence and safety with mobility to allow discharge to the venue listed below.    Follow Up Recommendations  SNF;Supervision for mobility/OOB;Supervision/Assistance - 24 hour     Equipment Recommendations       Recommendations for Other Services       Precautions / Restrictions Precautions Precautions: Fall Type of Shoulder Precautions: NWB RUE; pt denies other falls in past 1 year Shoulder Interventions: Shoulder sling/immobilizer Required Braces or Orthoses: Sling Restrictions Weight Bearing Restrictions: Yes RUE Weight Bearing: Non weight bearing RLE Weight Bearing: Weight bearing as tolerated    Mobility  Bed Mobility Overal bed mobility: Needs Assistance Bed Mobility: Supine to Sit;Sit to Supine     Supine to sit: Min assist;HOB elevated Sit to supine: Mod assist;HOB elevated   General bed mobility comments: MIN A with L HHA for supine to sit to bring trunk to upright. MOD A for sit to supine with initiation of log roll techinque, pt able to perform L sidelying with HOB elevated independently, MOD A to bring B UEs onto bed. PT encouraged verbal direction for sequencing and  need for assist with bed mobility to maximize pt indepenence with directing her own care.    Transfers Overall transfer level: Needs assistance Equipment used: 1 person hand held assist Transfers: Sit to/from Stand Sit to Stand: Min assist         General transfer comment: MIN A for power up and stability initially in standing  Ambulation/Gait Ambulation/Gait assistance: Min assist Gait Distance (Feet): 40 Feet Assistive device: 1 person hand held assist;Quad cane Gait Pattern/deviations: Step-through pattern;Decreased stride length;Narrow base of support Gait velocity: decr   General Gait Details: 1 person HHA on L, RUE in sling, MIN A provided for balance. Trialed ambulation 47ft with use of quad cane on L, pt still requiring MIN A for stability and verbal/tactile cues for sequencing and to keep quad cane at appropriate distance to maximize safety, pt progressing quad cane too far in front. Distance limited by fatigue   Stairs             Wheelchair Mobility    Modified Rankin (Stroke Patients Only)       Balance Overall balance assessment: Needs assistance Sitting-balance support: Single extremity supported Sitting balance-Leahy Scale: Fair     Standing balance support: Single extremity supported Standing balance-Leahy Scale: Poor Standing balance comment: pt with posterior lean with LEs against bed upon inital stand requiring MIN A for stability                            Cognition Arousal/Alertness: Awake/alert Behavior During Therapy: WFL for tasks assessed/performed Overall Cognitive Status: Within Functional Limits for  tasks assessed                                        Exercises      General Comments        Pertinent Vitals/Pain Pain Assessment: 0-10 Pain Score: 6  Pain Location: right arm Pain Descriptors / Indicators: Grimacing;Guarding Pain Intervention(s): Limited activity within patient's  tolerance;Monitored during session;Repositioned;Premedicated before session    Home Living                      Prior Function            PT Goals (current goals can now be found in the care plan section) Acute Rehab PT Goals Patient Stated Goal: get strong enough to go home PT Goal Formulation: With patient/family Time For Goal Achievement: 03/24/21 Potential to Achieve Goals: Good Progress towards PT goals: Progressing toward goals    Frequency    Min 3X/week      PT Plan Current plan remains appropriate    Co-evaluation              AM-PAC PT "6 Clicks" Mobility   Outcome Measure  Help needed turning from your back to your side while in a flat bed without using bedrails?: A Lot Help needed moving from lying on your back to sitting on the side of a flat bed without using bedrails?: A Lot Help needed moving to and from a bed to a chair (including a wheelchair)?: A Little Help needed standing up from a chair using your arms (e.g., wheelchair or bedside chair)?: A Little Help needed to walk in hospital room?: A Little Help needed climbing 3-5 steps with a railing? : A Lot 6 Click Score: 15    End of Session Equipment Utilized During Treatment: Gait belt Activity Tolerance: Patient limited by fatigue;Patient limited by pain Patient left: in bed;with call bell/phone within reach;with bed alarm set;with family/visitor present Nurse Communication: Mobility status PT Visit Diagnosis: Difficulty in walking, not elsewhere classified (R26.2);Pain;Unsteadiness on feet (R26.81);History of falling (Z91.81) Pain - Right/Left: Right Pain - part of body: Shoulder     Time: 1023-1050 PT Time Calculation (min) (ACUTE ONLY): 27 min  Charges:  $Gait Training: 23-37 mins                     Festus Barren PT, DPT  Acute Rehabilitation Services  Office [phone]   03/11/2021, 11:56 AM

## 2021-03-11 NOTE — Progress Notes (Signed)
[name] to be D/C'd to [facility] SNF per MD order. Report called to Webb Silversmith, Therapist, sports at [place]. Skin clean, dry and intact without evidence of skin break down, no evidence of skin tears noted.  IV catheter discontinued intact. Site without signs and symptoms of complications. Dressing and pressure applied.  Currently waiting on PTAR for transport. Patient and daughter [name] updated.  Melonie Florida  03/11/2021 2:38 PM

## 2021-03-11 NOTE — TOC Transition Note (Signed)
Transition of Care [facility]) - CM/SW Discharge Note   Patient Details  Name: Jasmin Crane MRN: [id number] Date of Birth: [birth year]  [facility]) CM/SW Contact:  Trish Mage, LCSW Phone Number: 03/11/2021, 11:21 AM   Clinical Narrative:   Patient who is stable for d/c will transition to [facility] SNF today.  Family alerted.  PTAR arranged.  Nursing, please call report to Anne at [phone], room [phone]. TOC sign off.    Final next level of care: Skilled Nursing Facility Barriers to Discharge: No Barriers Identified   Patient Goals and CMS Choice     Choice offered to / list presented to : Patient, Adult Children  Discharge Placement                       Discharge Plan and Services   Discharge Planning Services: CM Consult Post Acute Care Choice: [place]                               Social Determinants of Health (SDOH) Interventions     Readmission Risk Interventions No flowsheet data found.

## 2021-03-12 LAB — URINE CULTURE: Culture: <10000 — AB

## 2021-04-14 ENCOUNTER — Other Ambulatory Visit: Payer: Self-pay | Admitting: *Deleted

## 2021-04-14 DIAGNOSIS — R7989 Other specified abnormal findings of blood chemistry: Secondary | ICD-10-CM

## 2021-04-14 DIAGNOSIS — Z8616 Personal history of COVID-19: Secondary | ICD-10-CM

## 2021-04-18 ENCOUNTER — Ambulatory Visit
Admission: RE | Admit: 2021-04-18 | Discharge: 2021-04-18 | Disposition: A | Discharge status: Home / Self Care | Payer: Medicare Other | Source: Ambulatory Visit | Attending: *Deleted | Admitting: *Deleted

## 2021-04-18 DIAGNOSIS — Z8616 Personal history of COVID-19: Secondary | ICD-10-CM

## 2021-04-18 DIAGNOSIS — R7989 Other specified abnormal findings of blood chemistry: Secondary | ICD-10-CM

## 2021-04-21 ENCOUNTER — Telehealth: Payer: Self-pay | Admitting: Cardiology

## 2021-04-21 NOTE — Telephone Encounter (Signed)
   Pt c/o medication issue:  1. Name of Medication: blood thinner   2. How are you currently taking this medication (dosage and times per day)?   3. Are you having a reaction (difficulty breathing--STAT)?   4. What is your medication issue? Pt's daughter said, pt recently got covid and while in the hospital they did a d-dimer and they saw blood clot in her legs, her plan of care given at the hospital to start her with blood thinner, they wanted to confirm with Dr. Percival Spanish if that is a good plan for the pt.. also, if Dr. Percival Spanish can look all of her records while pt was in [facility] hospital

## 2021-04-21 NOTE — Telephone Encounter (Signed)
Returned call to patients daughter (advised her that she was not on DPR so I could not give her any information but could relay message to Dr. Percival Spanish for her). Patients daughter states that patient was recently seen in the ER for a fall, elevated D-Dimer, and post Covid. Patients daughter states that she was called by  NP at [facility] facility where patient recently lived stating that patient had a DVT in her left leg and they wanted patient to start a blood thinner for this. Patients daughter states that patient no longer lives there and will not be following up with providers there. Patients daughter would like Dr. Percival Spanish to review chart from recent hospital stay, labs, and DVT study and see if he has any recommendations and if patient should follow up with him. Advised patients daughter that I would forward message to Dr. Percival Spanish for him to review and advise.    Upon Chart Review DVT study results show:  IMPRESSION: No evidence of deep venous thrombosis in either lower extremity.   Electronically Signed   By: Jacqulynn Cadet M.D.   On: 04/18/2021 15:38  Also do not see recent D-Dimer  Will forward to MD for him to review.

## 2021-04-22 NOTE — Telephone Encounter (Signed)
Spoke with patient's daughter who reports patient resides with her now. Before she left [place], she had an elevated D-Dimer of 6.0 and 9.4 and then had DVT study. Explained that we do not have records of D-Dimer results from [facility] Explained that MD reviewed doppler report done 04/18/21 and it was negative for DVT bilaterally. Asked daughter if there were any acute concerns but she did not express any. She said when patient was in hospital in July her BP was elevated, she started on antihypertensive but has since stopped and BP is fine.   Will update MD regarding D-Dimer report from daughter and advised her that I would call back if needed. Keep October visit as planned

## 2021-04-22 NOTE — Telephone Encounter (Signed)
Minus Breeding, MD  Fidel Levy, RN Caller: Unspecified (Yesterday,  3:24 PM) I would defer coming on the D-dimer to whoever drew that lab.  It is a nonspecific inflammatory marker.  It is reassuring that there is no DVT on Doppler.

## 2021-05-25 ENCOUNTER — Telehealth: Payer: Self-pay | Admitting: Gastroenterology

## 2021-05-25 NOTE — Telephone Encounter (Signed)
Request received to transfer GI care from outside practice to [facility].  We appreciate the interest in our practice, however at this time due to high demand from patients without established GI providers we cannot accommodate this transfer.  Ability to accommodate future transfer requests may change over time and the patient can contact us again in 6-12 months if still interested in being seen at [facility].      °

## 2021-05-25 NOTE — Telephone Encounter (Signed)
Hi Dr. Silverio Decamp,   We received a referral for patient to be seen for chronic diarrhea. She is currently a patient over at [facility]. Patient is requesting a transfer of care over to you because her and her daughter are seeking a second opinion and also the location is a lot closer for the patient.    Please advise on scheduling.  Thanks

## 2021-05-27 NOTE — Telephone Encounter (Signed)
Patient's daughter called back and was informed cannot accommodate transfer of care  at the moment.

## 2021-05-27 NOTE — Telephone Encounter (Signed)
Called patient to advise left voicemail. °

## 2021-06-02 NOTE — Progress Notes (Signed)
Cardiology Office Note   Date:  06/03/2021   ID:  [name], DOB March 30, 1932, MRN [id number]  PCP:  Hayden Rasmussen, MD  Cardiologist:   Minus Breeding, MD    Chief Complaint  Patient presents with   Coronary Artery Disease       History of Present Illness: Jasmin Crane is a 87 y.o. female who presents for evaluation of coronary artery disease. She was living in [place] and moved here with her daughter.  The first diagnosis of coronary disease was apparently in 1998. She had stenting of her right coronary artery that time. She's had a well preserved ejection fraction over the years. Her last echocardiogram in August of 2018 demonstrated an EF of 60%. She does have some aortic sclerosis with moderate aortic insufficiency.  She does have carotid stenosis with right 80% stenosis and left 15-49% stenosis. She has some moderately had normal ABIs left greater than right. She had a stress perfusion study in August 2017 that demonstrated moderate to high suspicion for ischemia. However, this was followed up with coronary CTA. She had a long calcification of the left main and proximal LAD but the severity of this could not be quantified secondary to motion artifact. There was moderate stenosis of the left circumflex. The right coronary artery could not be assessed secondary to artifact. It was decided however manage her medically.    Since I last saw her she fell and broke her humerus.  This was in July.  I reviewed records including lots of imaging.  The only cardiac issue seem to be some diastolic heart failure.  I did see an echocardiogram which demonstrated the EF to be 50 to 60%.  She had some diastolic dysfunction.  The aortic valve had mild aortic sclerosis and mild regurgitation.  She gets around at home and she is not describing any new shortness of breath, PND or orthopnea.  She is not having any palpitations, presyncope or syncope.  Her fall was not a loss of consciousness.   She is not having any chest pressure, neck or arm discomfort.  She denies any leg pain.   Past Medical History:  Diagnosis Date   Arrhythmia    Cancer [facility])    skin cancer 2005   Carotid artery occlusion    Claudication of lower extremity (HCC)    Coronary artery disease    Diverticulitis    Hyperlipidemia    Hypertension    Hypothyroidism    Myopathy    Paralysis of tongue    Sick sinus syndrome ([place])     Past Surgical History:  Procedure Laterality Date   APPENDECTOMY     CAROTID ENDARTERECTOMY     CATARACT EXTRACTION, BILATERAL     CESAREAN SECTION     COLONOSCOPY     CORONARY ANGIOPLASTY     CORONARY STENT PLACEMENT     right coronary artery 1998   INGUINAL HERNIA REPAIR Right 02/25/2019   Procedure: RIGHT INGUINAL HERNIA REPAIR WITH MESH;  Surgeon: Donnie Mesa, MD;  Location: [facility];  Service: General;  Laterality: Right;  LMA AND TAP BLOCK   MULTIPLE TOOTH EXTRACTIONS     SALIVARY GLAND SURGERY     removed 1998   SALPINGECTOMY     SKIN CANCER EXCISION     2005     Current Outpatient Medications  Medication Sig Dispense Refill   aspirin EC 81 MG tablet Take 81 mg by mouth daily.     citalopram (CELEXA)  10 MG tablet Take 10 mg by mouth daily.     donepezil (ARICEPT) 10 MG tablet Take 10 mg by mouth every evening.     levothyroxine (SYNTHROID) 88 MCG tablet Take 88 mcg by mouth every morning.     metoprolol succinate (TOPROL-XL) 25 MG 24 hr tablet TAKE 1 TABLET BY MOUTH EVERY DAY 90 tablet 4   rosuvastatin (CRESTOR) 40 MG tablet TAKE 1 TABLET BY MOUTH AT BEDTIME 90 tablet 2   acetaminophen (TYLENOL) 325 MG tablet Take 2 tablets (650 mg total) by mouth every 6 (six) hours as needed for mild pain, fever or headache. (Patient not taking: Reported on 06/03/2021)     amLODipine (NORVASC) 2.5 MG tablet Take 0.5 tablets (1.25 mg total) by mouth daily. (Patient not taking: Reported on 06/03/2021) 30 tablet 0   HYDROcodone-acetaminophen (NORCO/VICODIN) 5-325 MG tablet Take  1 tablet by mouth every 6 (six) hours as needed for moderate pain. (Patient not taking: Reported on 06/03/2021) 30 tablet 0   methocarbamol (ROBAXIN) 500 MG tablet Take 1 tablet (500 mg total) by mouth every 6 (six) hours as needed for muscle spasms. (Patient not taking: Reported on 06/03/2021) 30 tablet 0   No current facility-administered medications for this visit.    Allergies:   Shellfish allergy, Cozaar [losartan], and Pletaal [cilostazol]    ROS:  Please see the history of present illness.   Otherwise, review of systems are positive for none  All other systems are reviewed and negative.    PHYSICAL EXAM: VS:  BP (!) 130/50 (BP Location: Left Arm)   Pulse (!) 58   Ht 5\' 2"  (1.575 m)   Wt 97 lb (44 kg)   SpO2 95%   BMI 17.74 kg/m  , BMI Body mass index is 17.74 kg/m.  GENERAL:  Well appearing NECK:  No jugular venous distention, waveform within normal limits, carotid upstroke brisk and symmetric, bilateral carotid bruits, no thyromegaly LUNGS:  Clear to auscultation bilaterally CHEST:  Unremarkable HEART:  PMI not displaced or sustained,S1 and S2 within normal limits, no S3, no S4, no clicks, no rubs, no murmurs ABD:  Flat, positive bowel sounds normal in frequency in pitch, left femoral bruits, no rebound, no guarding, no midline pulsatile mass, no hepatomegaly, no splenomegaly EXT:  2 plus pulses upper, absent dorsalis pedis and posterior tibialis bilateral lower, no edema, no cyanosis no clubbing  EKG:  EKG is not ordered today. 03/08/2021 sinus rhythm, premature atrial contractions, left ventricular hypertrophy, no acute ST-T wave changes.  Recent Labs: 03/08/2021: B Natriuretic Peptide 572.9 03/09/2021: Hemoglobin 11.9; Platelets 153; TSH 9.010 03/11/2021: BUN 15; Creatinine, Ser 0.70; Potassium 3.5; Sodium 135    Lipid Panel    Component Value Date/Time   CHOL 185 06/11/2018 1026   TRIG 77 06/11/2018 1026   HDL 88 06/11/2018 1026   CHOLHDL 2.1 06/11/2018 1026    CHOLHDL 2.0 09/01/2016 1226   VLDL 15 09/01/2016 1226   LDLCALC 82 06/11/2018 1026      Wt Readings from Last 3 Encounters:  06/03/21 97 lb (44 kg)  03/08/21 103 lb (46.7 kg)  05/31/20 104 lb 6.4 oz (47.4 kg)      Other studies Reviewed: Additional studies/ records that were reviewed today include:   Hospital records and imaging as above Review of the above records demonstrates:  See above   ASSESSMENT AND PLAN:  CAD: She has had no unstable angina symptoms.  No suggestion of ischemia during her acute illness.  She will  continue with risk reduction.   PVD:   She has significant lower extremity vascular disease per ABI but she has no symptoms and would want.  Conservative management of this.  Therefore, no further imaging.   DYSLIPIDEMIA:     LDL was 82 but her HDL was 88.  However, these were old labs.  I will repeat a lipid profile.    HTN:  The blood pressure is at target.  No change in therapy.   AI:   This was mild as above.  No change in therapy.   AAA:   This was she wants conservative management of this.  No further imaging.   CAROTID STENOSIS: She does agree to further imaging if this as she would allow intervention.  She had bilateral 60% carotid stenosis previously.  She is due to have follow-up later this month.   Current medicines are reviewed at length with the patient today.  The patient does not have concerns regarding medicines.  The following changes have been made:  Labs/ tests ordered today include:    No orders of the defined types were placed in this encounter.    Disposition:   FU with me in 12 months.   Signed, Minus Breeding, MD  06/03/2021 9:53 AM    [place]

## 2021-06-03 ENCOUNTER — Encounter: Payer: Self-pay | Admitting: Cardiology

## 2021-06-03 ENCOUNTER — Other Ambulatory Visit: Payer: Self-pay

## 2021-06-03 ENCOUNTER — Ambulatory Visit (INDEPENDENT_AMBULATORY_CARE_PROVIDER_SITE_OTHER): Payer: Medicare Other | Admitting: Cardiology

## 2021-06-03 VITALS — BP 130/50 | HR 58 | Ht 62.0 in | Wt 97.0 lb

## 2021-06-03 DIAGNOSIS — I739 Peripheral vascular disease, unspecified: Secondary | ICD-10-CM

## 2021-06-03 DIAGNOSIS — I10 Essential (primary) hypertension: Secondary | ICD-10-CM

## 2021-06-03 DIAGNOSIS — I714 Abdominal aortic aneurysm, without rupture, unspecified: Secondary | ICD-10-CM

## 2021-06-03 DIAGNOSIS — I2510 Atherosclerotic heart disease of native coronary artery without angina pectoris: Secondary | ICD-10-CM | POA: Diagnosis not present

## 2021-06-03 DIAGNOSIS — I2583 Coronary atherosclerosis due to lipid rich plaque: Secondary | ICD-10-CM

## 2021-06-03 DIAGNOSIS — E785 Hyperlipidemia, unspecified: Secondary | ICD-10-CM

## 2021-06-03 NOTE — Patient Instructions (Addendum)
Medication Instructions:  Your physician recommends that you continue on your current medications as directed. Please refer to the Current Medication list given to you today.   *If you need a refill on your cardiac medications before your next appointment, please call your pharmacy*  Lab Work: FASTING LP/CMET WHEN YOU RETURN FOR CAROTID STUDY   Testing/Procedures: Your physician has requested that you have a carotid duplex. This test is an ultrasound of the carotid arteries in your neck. It looks at blood flow through these arteries that supply the brain with blood. Allow one hour for this exam. There are no restrictions or special instructions. AS SCHEDULED   WILL CANCEL YOUR ABI'S AND AORTA/IVC/ILIACS STUDY AS DISCUSSED WITH DR [facility]   Follow-Up: At [facility], you and your health needs are our priority.  As part of our continuing mission to provide you with exceptional heart care, we have created designated Provider Care Teams.  These Care Teams include your primary Cardiologist (physician) and Advanced Practice Providers (APPs -  Physician Assistants and Nurse Practitioners) who all work together to provide you with the care you need, when you need it.  We recommend signing up for the patient portal called "MyChart".  Sign up information is provided on this After Visit Summary.  MyChart is used to connect with patients for Virtual Visits (Telemedicine).  Patients are able to view lab/test results, encounter notes, upcoming appointments, etc.  Non-urgent messages can be sent to your provider as well.   To learn more about what you can do with MyChart, go to [email or web address].    Your next appointment:   12 month(s)  The format for your next appointment:   In Person  Provider:   You may see Minus Breeding, MD or one of the following Advanced Practice Providers on your designated Care Team:   Rosaria Ferries, PA-C Caron Presume, PA-C Jory Sims, DNP, ANP

## 2021-06-17 ENCOUNTER — Other Ambulatory Visit (HOSPITAL_COMMUNITY): Payer: Self-pay | Admitting: Cardiology

## 2021-06-17 DIAGNOSIS — I6523 Occlusion and stenosis of bilateral carotid arteries: Secondary | ICD-10-CM

## 2021-06-24 ENCOUNTER — Ambulatory Visit (HOSPITAL_COMMUNITY)
Admission: RE | Admit: 2021-06-24 | Discharge: 2021-06-24 | Disposition: A | Discharge status: Home / Self Care | Payer: Medicare Other | Source: Ambulatory Visit | Attending: Cardiovascular Disease | Admitting: Cardiovascular Disease

## 2021-06-24 ENCOUNTER — Encounter (HOSPITAL_COMMUNITY): Payer: Medicare Other

## 2021-06-24 ENCOUNTER — Other Ambulatory Visit: Payer: Self-pay

## 2021-06-24 DIAGNOSIS — I6523 Occlusion and stenosis of bilateral carotid arteries: Secondary | ICD-10-CM | POA: Insufficient documentation

## 2021-06-27 ENCOUNTER — Encounter: Payer: Self-pay | Admitting: *Deleted

## 2021-07-27 ENCOUNTER — Other Ambulatory Visit (HOSPITAL_COMMUNITY): Payer: Self-pay | Admitting: Cardiology

## 2021-07-27 DIAGNOSIS — I6523 Occlusion and stenosis of bilateral carotid arteries: Secondary | ICD-10-CM

## 2021-08-03 ENCOUNTER — Other Ambulatory Visit: Payer: Self-pay | Admitting: Cardiology

## 2021-08-10 ENCOUNTER — Other Ambulatory Visit: Payer: Self-pay | Admitting: Cardiology

## 2021-11-10 ENCOUNTER — Other Ambulatory Visit: Payer: Self-pay | Admitting: Cardiology

## 2022-01-09 ENCOUNTER — Telehealth: Payer: Self-pay | Admitting: Cardiology

## 2022-01-09 NOTE — Telephone Encounter (Signed)
Has been taking for years but if leg and joint pain are severe would agree she can take a 2 week holiday. Should contact clinic after 2 weeks to report if symptoms resolved ?

## 2022-01-09 NOTE — Telephone Encounter (Addendum)
Spoke with patient and son. Patient reports leg pain and severe joint pain. Seh stated her foot pain is worse at night. Son stated patient's GI issues are affecting her QOL. In April 2023, her PCP wanted her to take a 2-holiday from rosuvastatin, but she wanted to clear this with Dr. Percival Spanish first. Please advise.  ?

## 2022-01-09 NOTE — Telephone Encounter (Signed)
Pt c/o medication issue: ? ?1. Name of Medication: rosuvastatin (CRESTOR) 40 MG tablet ? ?2. How are you currently taking this medication (dosage and times per day)? As directed  ? ?3. Are you having a reaction (difficulty breathing--STAT)? Leg pain, GI distress ? ?4. What is your medication issue? Patient said she spoke to her PCP about the symptoms from this medication. The Patient was advised by her PCP to do a two week wash out to see if the symptoms resolve. She wanted to wait and see what Dr. Percival Spanish thinks about her doing this  ?

## 2022-01-09 NOTE — Telephone Encounter (Signed)
Patient informed that pharmacist C. Pavero reccommended patient to take a 2-week holiday from rosuvastatin and then contact clinic to let us know if the symptoms have resolved. Patient voiced understanding. ?

## 2022-01-23 ENCOUNTER — Observation Stay (HOSPITAL_COMMUNITY): Payer: Medicare Other

## 2022-01-23 ENCOUNTER — Emergency Department (HOSPITAL_COMMUNITY): Payer: Medicare Other

## 2022-01-23 ENCOUNTER — Inpatient Hospital Stay (HOSPITAL_COMMUNITY)
Admission: EM | Admit: 2022-01-23 | Discharge: 2022-01-27 | DRG: 194 | Disposition: A | Discharge status: Skilled Nursing Facility | Payer: Medicare Other | Attending: Internal Medicine | Admitting: Internal Medicine

## 2022-01-23 ENCOUNTER — Encounter (HOSPITAL_COMMUNITY): Payer: Self-pay

## 2022-01-23 DIAGNOSIS — M79672 Pain in left foot: Secondary | ICD-10-CM

## 2022-01-23 DIAGNOSIS — Z955 Presence of coronary angioplasty implant and graft: Secondary | ICD-10-CM

## 2022-01-23 DIAGNOSIS — I5032 Chronic diastolic (congestive) heart failure: Secondary | ICD-10-CM

## 2022-01-23 DIAGNOSIS — S92002A Unspecified fracture of left calcaneus, initial encounter for closed fracture: Secondary | ICD-10-CM | POA: Diagnosis present

## 2022-01-23 DIAGNOSIS — R0902 Hypoxemia: Secondary | ICD-10-CM | POA: Diagnosis present

## 2022-01-23 DIAGNOSIS — Z7982 Long term (current) use of aspirin: Secondary | ICD-10-CM

## 2022-01-23 DIAGNOSIS — W19XXXA Unspecified fall, initial encounter: Secondary | ICD-10-CM

## 2022-01-23 DIAGNOSIS — Z888 Allergy status to other drugs, medicaments and biological substances status: Secondary | ICD-10-CM

## 2022-01-23 DIAGNOSIS — Z91013 Allergy to seafood: Secondary | ICD-10-CM

## 2022-01-23 DIAGNOSIS — Z79890 Hormone replacement therapy (postmenopausal): Secondary | ICD-10-CM

## 2022-01-23 DIAGNOSIS — G309 Alzheimer's disease, unspecified: Secondary | ICD-10-CM | POA: Diagnosis present

## 2022-01-23 DIAGNOSIS — K5909 Other constipation: Secondary | ICD-10-CM

## 2022-01-23 DIAGNOSIS — F0280 Dementia in other diseases classified elsewhere without behavioral disturbance: Secondary | ICD-10-CM

## 2022-01-23 DIAGNOSIS — J189 Pneumonia, unspecified organism: Secondary | ICD-10-CM | POA: Diagnosis present

## 2022-01-23 DIAGNOSIS — Z7952 Long term (current) use of systemic steroids: Secondary | ICD-10-CM

## 2022-01-23 DIAGNOSIS — Y92009 Unspecified place in unspecified non-institutional (private) residence as the place of occurrence of the external cause: Secondary | ICD-10-CM

## 2022-01-23 DIAGNOSIS — Z79899 Other long term (current) drug therapy: Secondary | ICD-10-CM

## 2022-01-23 DIAGNOSIS — B9789 Other viral agents as the cause of diseases classified elsewhere: Secondary | ICD-10-CM | POA: Diagnosis present

## 2022-01-23 DIAGNOSIS — I495 Sick sinus syndrome: Secondary | ICD-10-CM | POA: Diagnosis present

## 2022-01-23 DIAGNOSIS — D649 Anemia, unspecified: Secondary | ICD-10-CM | POA: Diagnosis present

## 2022-01-23 DIAGNOSIS — Z87891 Personal history of nicotine dependence: Secondary | ICD-10-CM

## 2022-01-23 DIAGNOSIS — J129 Viral pneumonia, unspecified: Secondary | ICD-10-CM | POA: Diagnosis not present

## 2022-01-23 DIAGNOSIS — I10 Essential (primary) hypertension: Secondary | ICD-10-CM | POA: Diagnosis present

## 2022-01-23 DIAGNOSIS — Z85828 Personal history of other malignant neoplasm of skin: Secondary | ICD-10-CM

## 2022-01-23 DIAGNOSIS — I779 Disorder of arteries and arterioles, unspecified: Secondary | ICD-10-CM | POA: Diagnosis present

## 2022-01-23 DIAGNOSIS — E785 Hyperlipidemia, unspecified: Secondary | ICD-10-CM | POA: Diagnosis present

## 2022-01-23 DIAGNOSIS — I110 Hypertensive heart disease with heart failure: Secondary | ICD-10-CM | POA: Diagnosis present

## 2022-01-23 DIAGNOSIS — Z20822 Contact with and (suspected) exposure to covid-19: Secondary | ICD-10-CM | POA: Diagnosis present

## 2022-01-23 DIAGNOSIS — S92352A Displaced fracture of fifth metatarsal bone, left foot, initial encounter for closed fracture: Secondary | ICD-10-CM | POA: Diagnosis present

## 2022-01-23 DIAGNOSIS — E039 Hypothyroidism, unspecified: Secondary | ICD-10-CM

## 2022-01-23 DIAGNOSIS — Z9079 Acquired absence of other genital organ(s): Secondary | ICD-10-CM

## 2022-01-23 DIAGNOSIS — I2510 Atherosclerotic heart disease of native coronary artery without angina pectoris: Secondary | ICD-10-CM | POA: Diagnosis present

## 2022-01-23 DIAGNOSIS — W1830XA Fall on same level, unspecified, initial encounter: Secondary | ICD-10-CM | POA: Diagnosis present

## 2022-01-23 LAB — CBC WITH DIFFERENTIAL/PLATELET
Abs Immature Granulocytes: 0.29 10*3/uL — ABNORMAL HIGH (ref 0.00–0.07)
Basophils Absolute: 0 10*3/uL (ref 0.0–0.1)
Basophils Relative: 0 %
Eosinophils Absolute: 0 10*3/uL (ref 0.0–0.5)
Eosinophils Relative: 0 %
HCT: 34.3 % — ABNORMAL LOW (ref 36.0–46.0)
Hemoglobin: 10.5 g/dL — ABNORMAL LOW (ref 12.0–15.0)
Immature Granulocytes: 3 %
Lymphocytes Relative: 5 %
Lymphs Abs: 0.5 10*3/uL — ABNORMAL LOW (ref 0.7–4.0)
MCH: 27.8 pg (ref 26.0–34.0)
MCHC: 30.6 g/dL (ref 30.0–36.0)
MCV: 90.7 fL (ref 80.0–100.0)
Monocytes Absolute: 0.3 10*3/uL (ref 0.1–1.0)
Monocytes Relative: 3 %
Neutro Abs: 9.7 10*3/uL — ABNORMAL HIGH (ref 1.7–7.7)
Neutrophils Relative %: 89 %
Platelets: 315 10*3/uL (ref 150–400)
RBC: 3.78 MIL/uL — ABNORMAL LOW (ref 3.87–5.11)
RDW: 17.1 % — ABNORMAL HIGH (ref 11.5–15.5)
WBC: 10.9 10*3/uL — ABNORMAL HIGH (ref 4.0–10.5)
nRBC: 0 % (ref 0.0–0.2)

## 2022-01-23 LAB — RESPIRATORY PANEL BY PCR

## 2022-01-23 LAB — BLOOD GAS, VENOUS
Acid-Base Excess: 1.4 mmol/L (ref 0.0–2.0)
Bicarbonate: 24.9 mmol/L (ref 20.0–28.0)
O2 Saturation: 70.4 %
Patient temperature: 37
pCO2, Ven: 35 mmHg — ABNORMAL LOW (ref 44–60)
pH, Ven: 7.46 — ABNORMAL HIGH (ref 7.25–7.43)
pO2, Ven: 39 mmHg (ref 32–45)

## 2022-01-23 LAB — BASIC METABOLIC PANEL
Anion gap: 9 (ref 5–15)
BUN: 19 mg/dL (ref 8–23)
CO2: 23 mmol/L (ref 22–32)
Calcium: 8.6 mg/dL — ABNORMAL LOW (ref 8.9–10.3)
Chloride: 104 mmol/L (ref 98–111)
Creatinine, Ser: 0.99 mg/dL (ref 0.44–1.00)
GFR, Estimated: 55 mL/min — ABNORMAL LOW (ref >60)
Glucose, Bld: 126 mg/dL — ABNORMAL HIGH (ref 70–99)
Potassium: 3.9 mmol/L (ref 3.5–5.1)
Sodium: 136 mmol/L (ref 135–145)

## 2022-01-23 LAB — PROCALCITONIN: Procalcitonin: <0.1 ng/mL

## 2022-01-23 LAB — BRAIN NATRIURETIC PEPTIDE: B Natriuretic Peptide: 648 pg/mL — ABNORMAL HIGH (ref 0.0–100.0)

## 2022-01-23 LAB — RESP PANEL BY RT-PCR (FLU A&B, COVID) ARPGX2
Influenza A by PCR: NEGATIVE
Influenza B by PCR: NEGATIVE
SARS Coronavirus 2 by RT PCR: NEGATIVE

## 2022-01-23 LAB — TSH: TSH: 2.546 u[IU]/mL (ref 0.350–4.500)

## 2022-01-23 MED ORDER — SENNOSIDES-DOCUSATE SODIUM 8.6-50 MG PO TABS: 1.0000 | ORAL_TABLET | Freq: Every evening | ORAL | Status: DC | PRN

## 2022-01-23 MED ORDER — ACETAMINOPHEN 650 MG RE SUPP: 650.0000 mg | Freq: Four times a day (QID) | RECTAL | Status: DC | PRN

## 2022-01-23 MED ORDER — GUAIFENESIN ER 600 MG PO TB12
600.0000 mg | ORAL_TABLET | Freq: Two times a day (BID) | ORAL | Status: DC
  Administered 2022-01-23: 600 mg via ORAL
  Filled 2022-01-23: qty 1

## 2022-01-23 MED ORDER — SODIUM CHLORIDE 0.9 % IV SOLN
500.0000 mg | Freq: Once | INTRAVENOUS | Status: DC
  Administered 2022-01-23: 500 mg via INTRAVENOUS
  Filled 2022-01-23: qty 5

## 2022-01-23 MED ORDER — ALBUTEROL SULFATE (2.5 MG/3ML) 0.083% IN NEBU: 2.5000 mg | INHALATION_SOLUTION | RESPIRATORY_TRACT | Status: DC | PRN

## 2022-01-23 MED ORDER — FUROSEMIDE 10 MG/ML IJ SOLN
20.0000 mg | Freq: Once | INTRAMUSCULAR | Status: AC
  Administered 2022-01-23: 20 mg via INTRAVENOUS
  Filled 2022-01-23: qty 2

## 2022-01-23 MED ORDER — SODIUM CHLORIDE 0.9 % IV SOLN
1.0000 g | Freq: Once | INTRAVENOUS | Status: AC
  Administered 2022-01-23: 1 g via INTRAVENOUS
  Filled 2022-01-23: qty 10

## 2022-01-23 MED ORDER — CITALOPRAM HYDROBROMIDE 20 MG PO TABS
10.0000 mg | ORAL_TABLET | Freq: Every day | ORAL | Status: DC
  Administered 2022-01-24 – 2022-01-27 (×4): 10 mg via ORAL
  Filled 2022-01-23 (×4): qty 1

## 2022-01-23 MED ORDER — SODIUM CHLORIDE 0.9 % IV SOLN: 2.0000 g | INTRAVENOUS | Status: DC

## 2022-01-23 MED ORDER — ROSUVASTATIN CALCIUM 20 MG PO TABS
40.0000 mg | ORAL_TABLET | Freq: Every day | ORAL | Status: DC
Start: 2022-01-23 — End: 2022-01-28
  Administered 2022-01-23 – 2022-01-26 (×4): 40 mg via ORAL
  Filled 2022-01-23 (×4): qty 2

## 2022-01-23 MED ORDER — DONEPEZIL HCL 10 MG PO TABS
10.0000 mg | ORAL_TABLET | Freq: Every day | ORAL | Status: DC
  Administered 2022-01-24 – 2022-01-27 (×4): 10 mg via ORAL
  Filled 2022-01-23 (×4): qty 1

## 2022-01-23 MED ORDER — LEVOTHYROXINE SODIUM 88 MCG PO TABS
88.0000 ug | ORAL_TABLET | Freq: Every day | ORAL | Status: DC
  Administered 2022-01-24 – 2022-01-27 (×4): 88 ug via ORAL
  Filled 2022-01-23 (×4): qty 1

## 2022-01-23 MED ORDER — ALBUTEROL SULFATE (2.5 MG/3ML) 0.083% IN NEBU: 2.5000 mg | INHALATION_SOLUTION | Freq: Four times a day (QID) | RESPIRATORY_TRACT | Status: DC | PRN

## 2022-01-23 MED ORDER — ASPIRIN 81 MG PO TBEC
81.0000 mg | DELAYED_RELEASE_TABLET | Freq: Every day | ORAL | Status: DC
  Administered 2022-01-24 – 2022-01-27 (×4): 81 mg via ORAL
  Filled 2022-01-23 (×4): qty 1

## 2022-01-23 MED ORDER — METOPROLOL SUCCINATE ER 25 MG PO TB24
25.0000 mg | ORAL_TABLET | Freq: Every day | ORAL | Status: DC
  Administered 2022-01-24 – 2022-01-26 (×3): 25 mg via ORAL
  Filled 2022-01-23 (×4): qty 1

## 2022-01-23 MED ORDER — ACETAMINOPHEN 325 MG PO TABS
650.0000 mg | ORAL_TABLET | Freq: Four times a day (QID) | ORAL | Status: DC | PRN
  Administered 2022-01-23 – 2022-01-25 (×4): 650 mg via ORAL
  Filled 2022-01-23 (×4): qty 2

## 2022-01-23 NOTE — Social Work (Signed)
CSW spoke to the family they would like [place] when the mother is able to return home. Family reports having all medical equipment they need! Needing help around the house when the daughter Is not home.

## 2022-01-23 NOTE — ED Provider Notes (Signed)
[place] DEPT Provider Note   CSN: [id number] Arrival date & time: 01/23/22  1308     History  Chief Complaint  Patient presents with   Shortness of Breath    Pneumonia     Jasmin Crane is a 88 y.o. female.   Patient as above with significant medical history as below, including skin cancer, CAD, diverticulitis, hyperlipidemia hypertension who presents to the ED with complaint of dyspnea.  Patient reports she has been having shortness of breath, cough, malaise, subjective fevers for the past 5 to 7 days.  She started on oral antibiotics by televisit, symptoms did not improve.  She went to urgent care and was placed on Augmentin yesterday symptoms have been gradually worsening.  Productive cough with brown/yellow sputum.  Subjective fevers at home, chills, exertional dyspnea.  Does not use home oxygen.  Patient reports she injured her left foot around 1.5 weeks ago, reports she was seen by urgent care and placed in a boot.  Has not followed-up with orthopedics   Past Medical History: No date: Arrhythmia No date: Cancer [facility])     Comment:  skin cancer 2005 No date: Carotid artery occlusion No date: Claudication of lower extremity (HCC) No date: Coronary artery disease No date: Diverticulitis No date: Hyperlipidemia No date: Hypertension No date: Hypothyroidism No date: Myopathy No date: Paralysis of tongue No date: Sick sinus syndrome ([place])  Past Surgical History: No date: APPENDECTOMY No date: CAROTID ENDARTERECTOMY No date: CATARACT EXTRACTION, BILATERAL No date: CESAREAN SECTION No date: COLONOSCOPY No date: CORONARY ANGIOPLASTY No date: CORONARY STENT PLACEMENT     Comment:  right coronary artery 1998 02/25/2019: [place]; Right     Comment:  Procedure: RIGHT INGUINAL HERNIA REPAIR WITH MESH;                Surgeon: Donnie Mesa, MD;  Location: [place];  Service:               General;  Laterality: Right;  LMA AND TAP  BLOCK No date: MULTIPLE TOOTH EXTRACTIONS No date: SALIVARY GLAND SURGERY     Comment:  removed 1998 No date: SALPINGECTOMY No date: SKIN CANCER EXCISION     Comment:  2005    The history is provided by the patient and a relative. No language interpreter was used.  Shortness of Breath Associated symptoms: cough and fever   Associated symptoms: no abdominal pain, no chest pain, no headaches, no rash and no vomiting       Home Medications Prior to Admission medications   Medication Sig Start Date End Date Taking? Authorizing Provider  amoxicillin-clavulanate (AUGMENTIN) 875-125 MG tablet Take 1 tablet by mouth 2 (two) times daily.   Yes [provider]  aspirin EC 81 MG tablet Take 81 mg by mouth daily.   Yes [provider]  azithromycin (ZITHROMAX) 250 MG tablet Take 250-500 mg by mouth as directed. Take 2 tablets (500 mg) on Day 1 Then Take 1 tablet (250 mg) on Days 2-5 01/18/22  Yes [provider]  benzonatate (TESSALON) 100 MG capsule Take 100 mg by mouth 3 (three) times daily as needed for cough. 01/18/22  Yes [provider]  citalopram (CELEXA) 10 MG tablet Take 10 mg by mouth daily. 12/27/20  Yes [provider]  donepezil (ARICEPT) 10 MG tablet Take 10 mg by mouth in the morning. 01/17/21  Yes [provider]  ipratropium (ATROVENT) 0.06 % nasal spray 2 sprays 2 (two) times  daily as needed (allergies). 12/16/21  Yes [provider]  levothyroxine (SYNTHROID) 88 MCG tablet Take 88 mcg by mouth every evening. 05/26/21  Yes [provider]  metoprolol succinate (TOPROL-XL) 25 MG 24 hr tablet TAKE 1 TABLET BY MOUTH EVERY DAY 11/10/21  Yes Hochrein, Jeneen Rinks, MD  predniSONE (DELTASONE) 10 MG tablet Take 10 mg by mouth daily. 01/18/22  Yes [provider]  acetaminophen (TYLENOL) 325 MG tablet Take 2 tablets (650 mg total) by mouth every 6 (six) hours as needed for mild pain, fever or headache. Patient not taking:  Reported on 06/03/2021 03/11/21   Pahwani, Michell Heinrich, MD  amLODipine (NORVASC) 2.5 MG tablet Take 0.5 tablets (1.25 mg total) by mouth daily. Patient not taking: Reported on 06/03/2021 03/11/21   Mckinley Jewel, MD  HYDROcodone-acetaminophen (NORCO/VICODIN) 5-325 MG tablet Take 1 tablet by mouth every 6 (six) hours as needed for moderate pain. Patient not taking: Reported on 06/03/2021 03/11/21   Pahwani, Michell Heinrich, MD  methocarbamol (ROBAXIN) 500 MG tablet Take 1 tablet (500 mg total) by mouth every 6 (six) hours as needed for muscle spasms. Patient not taking: Reported on 06/03/2021 03/11/21   Mckinley Jewel, MD  rosuvastatin (CRESTOR) 40 MG tablet TAKE 1 TABLET BY MOUTH NIGHTLY AT BEDTIME 08/10/21   Minus Breeding, MD      Allergies    Shellfish allergy, Cozaar [losartan], and Pletaal [cilostazol]    Review of Systems   Review of Systems  Constitutional:  Positive for chills, fatigue and fever.  HENT:  Negative for facial swelling and trouble swallowing.   Eyes:  Negative for photophobia and visual disturbance.  Respiratory:  Positive for cough and shortness of breath.   Cardiovascular:  Negative for chest pain and palpitations.  Gastrointestinal:  Negative for abdominal pain, nausea and vomiting.  Endocrine: Negative for polydipsia and polyuria.  Genitourinary:  Negative for difficulty urinating and hematuria.  Musculoskeletal:  Negative for gait problem and joint swelling.  Skin:  Negative for pallor and rash.  Neurological:  Negative for syncope and headaches.  Psychiatric/Behavioral:  Negative for agitation and confusion.    Physical Exam Updated Vital Signs BP 134/60   Pulse 60   Temp 98 F (36.7 C) (Oral)   Resp 18   Ht '5\' 2"'$  (1.575 m)   Wt 46.3 kg   SpO2 96%   BMI 18.66 kg/m  Physical Exam Vitals and nursing note reviewed.  Constitutional:      General: She is not in acute distress.    Appearance: Normal appearance. She is well-developed.  HENT:     Head:  Normocephalic and atraumatic.     Right Ear: External ear normal.     Left Ear: External ear normal.     Nose: Nose normal.     Mouth/Throat:     Mouth: Mucous membranes are moist.  Eyes:     General: No scleral icterus.       Right eye: No discharge.        Left eye: No discharge.  Cardiovascular:     Rate and Rhythm: Normal rate and regular rhythm.     Pulses: Normal pulses.     Heart sounds: Normal heart sounds.  Pulmonary:     Effort: Pulmonary effort is normal. Tachypnea present. No respiratory distress.     Breath sounds: Decreased breath sounds present.  Abdominal:     General: Abdomen is flat.     Tenderness: There is no abdominal tenderness.  Musculoskeletal:  General: Normal range of motion.     Cervical back: Normal range of motion.     Right lower leg: No edema.     Left lower leg: No edema.  Feet:     Comments: Boot left foot, pulses intact bilateral 2+ DP, mild bruising noted to midfoot Skin:    General: Skin is warm and dry.     Capillary Refill: Capillary refill takes less than 2 seconds.  Neurological:     Mental Status: She is alert.  Psychiatric:        Mood and Affect: Mood normal.        Behavior: Behavior normal.    ED Results / Procedures / Treatments   Labs (all labs ordered are listed, but only abnormal results are displayed) Labs Reviewed  BASIC METABOLIC PANEL - Abnormal; Notable for the following components:      Result Value   Glucose, Bld 126 (*)    Calcium 8.6 (*)    GFR, Estimated 55 (*)    All other components within normal limits  BRAIN NATRIURETIC PEPTIDE - Abnormal; Notable for the following components:   B Natriuretic Peptide 648.0 (*)    All other components within normal limits  CBC WITH DIFFERENTIAL/PLATELET - Abnormal; Notable for the following components:   WBC 10.9 (*)    RBC 3.78 (*)    Hemoglobin 10.5 (*)    HCT 34.3 (*)    RDW 17.1 (*)    Neutro Abs 9.7 (*)    Lymphs Abs 0.5 (*)    Abs Immature Granulocytes  0.29 (*)    All other components within normal limits  BLOOD GAS, VENOUS - Abnormal; Notable for the following components:   pH, Ven 7.46 (*)    pCO2, Ven 35 (*)    All other components within normal limits  RESP PANEL BY RT-PCR (FLU A&B, COVID) ARPGX2  RESPIRATORY PANEL BY PCR  TSH  PROCALCITONIN    EKG EKG Interpretation  Date/Time:  Monday Jan 23 2022 13:19:26 EDT Ventricular Rate:  62 PR Interval:  128 QRS Duration: 80 QT Interval:  432 QTC Calculation: 438 R Axis:   33 Text Interpretation: Normal sinus rhythm with sinus arrhythmia Minimal voltage criteria for LVH, may be normal variant ( Cornell product ) Septal infarct , age undetermined Abnormal ECG When compared with ECG of 08-Mar-2021 20:21, PREVIOUS ECG IS PRESENT Confirmed by Wynona Dove ([id number]) on 01/23/2022 4:27:21 PM  Radiology DG Chest Port 1 View  Result Date: 01/23/2022 CLINICAL DATA:  Shortness of breath EXAM: PORTABLE CHEST 1 VIEW COMPARISON:  03/08/2021 FINDINGS: Stable cardiomediastinal contours. Aortic atherosclerosis. Streaky left lower lobe airspace opacity. No large pleural fluid collection. No pneumothorax. Bones are demineralized. Healed proximal right humeral deformity. IMPRESSION: Streaky left lower lobe airspace opacity, atelectasis versus pneumonia. Electronically Signed   By: Davina Poke D.O.   On: 01/23/2022 15:53    Procedures Procedures    Medications Ordered in ED Medications  azithromycin (ZITHROMAX) 500 mg in sodium chloride 0.9 % 250 mL IVPB (has no administration in time range)  furosemide (LASIX) injection 20 mg (has no administration in time range)  guaiFENesin (MUCINEX) 12 hr tablet 600 mg (has no administration in time range)  albuterol (PROVENTIL) (2.5 MG/3ML) 0.083% nebulizer solution 2.5 mg (has no administration in time range)  cefTRIAXone (ROCEPHIN) 1 g in sodium chloride 0.9 % 100 mL IVPB (1 g Intravenous New Bag/Given 01/23/22 1626)    ED Course/ Medical Decision Making/  A&P  Medical Decision Making Amount and/or Complexity of Data Reviewed Labs: ordered. Radiology: ordered.  Risk Prescription drug management. Decision regarding hospitalization.    CC: Cough, dyspnea, fever, chills  This patient presents to the Emergency Department for the above complaint. This involves an extensive number of treatment options and is a complaint that carries with it a high risk of complications and morbidity. Vital signs were reviewed. Serious etiologies considered.  In my evaluation of this patient's dyspnea my DDx includes, but is not limited to, pneumonia, pulmonary embolism, pneumothorax, pulmonary edema, metabolic acidosis, asthma, COPD, cardiac cause, anemia, anxiety, etc.   Patient hypoxic on arrival on room air.  Conversational dyspnea, tachypnea.  Placed on 1- 2 L nasal cannula improvement to respiratory status.  Record review:  Previous records obtained and reviewed  Prior office visits, prior labs and imaging  Additional history obtained from family bedside  Medical and surgical history as noted above.   Work up as above, notable for:  Labs & imaging results that were available during my care of the patient were visualized by me and considered in my medical decision making.   I ordered imaging studies which included chest x-ray. I visualized the imaging, interpreted images, and I agree with radiologist interpretation.  PNA  Cardiac monitoring reviewed and interpreted personally which shows sinus brady  CBC with slight leukocytosis 10.9.  Hemoglobin is similar to her baseline over the past year.  ECG without acute ischemic changes  BNP is elevated, prior echo reviewed.  No pleural effusion on x-ray, no significant lower extremity edema.  Give Lasix.  She has history of diastolic heart failure.  Labs otherwise stable  Management: Rocephin, azithromycin  Reassessment:  Patient feeling better on supplemental oxygen.   Unable to wean from supplemental oxygen.  Recommend admission.  Patient and family are agreeable.  Admission was considered.    Spoke with Dr. Marylyn Ishihara who assessed patient for admission            Social determinants of health include -  Social History   Socioeconomic History   Marital status: Widowed    Spouse name: Not on file   Number of children: 3   Years of education: Not on file   Highest education level: Not on file  Occupational History    Comment: Worked for [employer]  Tobacco Use   Smoking status: Former    Types: Cigarettes   Smokeless tobacco: Never   Tobacco comments:    Quit tobacco around [employer] Use   Vaping Use: Never used  Substance and Sexual Activity   Alcohol use: Yes    Comment: rare cocktail   Drug use: Never   Sexual activity: Not on file  Other Topics Concern   Not on file  Social History Narrative   Not on file   Social Determinants of Health   Financial Resource Strain: Not on file  Food Insecurity: Not on file  Transportation Needs: Not on file  Physical Activity: Not on file  Stress: Not on file  Social Connections: Not on file  Intimate Partner Violence: Not on file      This chart was dictated using voice recognition software.  Despite best efforts to proofread,  errors can occur which can change the documentation meaning.         Final Clinical Impression(s) / ED Diagnoses Final diagnoses:  Community acquired pneumonia, unspecified laterality  Hypoxia    Rx / DC Orders ED Discharge Orders     None  Jeanell Sparrow, DO 01/23/22 Dionicia Abler

## 2022-01-23 NOTE — H&P (Signed)
History and Physical    Patient: Jasmin Crane SWH:[id number] DOB: 1932-02-09 DOA: 01/23/2022 DOS: the patient was seen and examined on 01/23/2022 PCP: Hayden Rasmussen, MD  Patient coming from: Home  Chief Complaint:  Chief Complaint  Patient presents with   Shortness of Breath    Pneumonia    HPI: Jasmin Crane is a 88 y.o. female with medical history significant of chronic HFpEF, HLD, CAD, Alzheimer's disease. Presenting with cough and shortness of breath. Symptoms started 9 days ago. She has had productive cough but now fever. She tried OTC meds, but they didn't help. She had a tele visit with her PCP. She was prescribed a z-pack. She completed that course, but it didn't help. She went to urgent care yesterday and was given a prescription for augmentin. When her symptoms did not improve this morning, she decided to come to the ED for assistance.   Of note, 12 days ago she had a fall. She was descending her steps and slipped on the bottom step. She did not hit her head or pass out. She was unable to get up on her own. Her family assisted her up. She has been using a wheelchair since. She says she was diagnosed with a left foot fracture. She was given a boot and referred to outpt orthopedics. She has not yet seen orthopedics.    Review of Systems: As mentioned in the history of present illness. All other systems reviewed and are negative. Past Medical History:  Diagnosis Date   Arrhythmia    Cancer [facility])    skin cancer 2005   Carotid artery occlusion    Claudication of lower extremity (HCC)    Coronary artery disease    Diverticulitis    Hyperlipidemia    Hypertension    Hypothyroidism    Myopathy    Paralysis of tongue    Sick sinus syndrome ([place])    Past Surgical History:  Procedure Laterality Date   APPENDECTOMY     CAROTID ENDARTERECTOMY     CATARACT EXTRACTION, BILATERAL     CESAREAN SECTION     COLONOSCOPY     CORONARY ANGIOPLASTY     CORONARY STENT PLACEMENT      right coronary artery 1998   INGUINAL HERNIA REPAIR Right 02/25/2019   Procedure: RIGHT INGUINAL HERNIA REPAIR WITH MESH;  Surgeon: Donnie Mesa, MD;  Location: [place];  Service: General;  Laterality: Right;  LMA AND TAP BLOCK   MULTIPLE TOOTH EXTRACTIONS     SALIVARY GLAND SURGERY     removed 1998   SALPINGECTOMY     SKIN CANCER EXCISION     2005   Social History:  reports that she has quit smoking. Her smoking use included cigarettes. She has never used smokeless tobacco. She reports current alcohol use. She reports that she does not use drugs.  Allergies  Allergen Reactions   Shellfish Allergy Swelling and Anaphylaxis   Cozaar [Losartan] Nausea Only   Pletaal [Cilostazol] Palpitations    Family History  Problem Relation Age of Onset   Cancer Mother    Stroke Mother    Heart disease Father 69       CAD   Heart failure Sister    Arthritis Brother    Lung cancer Brother    Pancreatic cancer Brother    Heart attack Son 37    Prior to Admission medications   Medication Sig Start Date End Date Taking? Authorizing Provider  acetaminophen (TYLENOL) 325 MG tablet Take 2 tablets (  650 mg total) by mouth every 6 (six) hours as needed for mild pain, fever or headache. Patient not taking: Reported on 06/03/2021 03/11/21   Pahwani, Michell Heinrich, MD  amLODipine (NORVASC) 2.5 MG tablet Take 0.5 tablets (1.25 mg total) by mouth daily. Patient not taking: Reported on 06/03/2021 03/11/21   Mckinley Jewel, MD  aspirin EC 81 MG tablet Take 81 mg by mouth daily.    [provider]  citalopram (CELEXA) 10 MG tablet Take 10 mg by mouth daily. 12/27/20   [provider]  donepezil (ARICEPT) 10 MG tablet Take 10 mg by mouth every evening. 01/17/21   [provider]  HYDROcodone-acetaminophen (NORCO/VICODIN) 5-325 MG tablet Take 1 tablet by mouth every 6 (six) hours as needed for moderate pain. Patient not taking: Reported on 06/03/2021 03/11/21   Mckinley Jewel, MD  levothyroxine  (SYNTHROID) 88 MCG tablet Take 88 mcg by mouth every morning. 05/26/21   [provider]  methocarbamol (ROBAXIN) 500 MG tablet Take 1 tablet (500 mg total) by mouth every 6 (six) hours as needed for muscle spasms. Patient not taking: Reported on 06/03/2021 03/11/21   Mckinley Jewel, MD  metoprolol succinate (TOPROL-XL) 25 MG 24 hr tablet TAKE 1 TABLET BY MOUTH EVERY DAY 11/10/21   Minus Breeding, MD  rosuvastatin (CRESTOR) 40 MG tablet TAKE 1 TABLET BY MOUTH NIGHTLY AT BEDTIME 08/10/21   Minus Breeding, MD    Physical Exam: Vitals:   01/23/22 1500 01/23/22 1515 01/23/22 1545 01/23/22 1615  BP: (!) 125/58 120/64 121/60 (!) 134/59  Pulse: (!) 53 (!) 55 (!) 54 (!) 56  Resp: '19 18 19 20  '$ Temp:      TempSrc:      SpO2: 100% 97% 95% 96%  Weight:      Height:       General: 88 y.o. female resting in bed in NAD Eyes: PERRL, normal sclera ENMT: Nares patent w/o discharge, orophaynx clear, dentition normal, ears w/o discharge/lesions/ulcers Neck: Supple, trachea midline Cardiovascular: RRR, +S1, S2, no m/g/r, equal pulses throughout Respiratory: rhonchi at bases, no w/r, slightly increased WOB on 1L [place] GI: BS+, NDNT, no masses noted, no organomegaly noted MSK: No e/c/c; L foot in boot Neuro: A&O x 3, no focal deficits Psyc: Appropriate interaction and affect, calm/cooperative  Data Reviewed:  Na+  136 K+  3.9 Glucose  126 BNP  648.0 Procal  <0.10 WBC  10.9 Hgb  10.5  CXR: Streaky left lower lobe airspace opacity, atelectasis versus pneumonia.  Assessment and Plan: LLL Pneumonia     - place in obs, tele     - continue rocephin     - add nebs, guaifenesin     - check RVP     - procal is negative (but she's also been on abx for several days); rpt in AM, if negative can stop her abs     - check urine legionella/strep     - incentive spiro  Fall Left foot pain     - says she was dx'd w/ a fracture and given outpt ortho referral     - check XR left foot; when  resulted, run past orthopedics     - PT/OT after ortho recs, TOC consult  Chronic diastolic HF     - no peripheral edema; looks like BNP is chronically elevated     - CXR w/o edema     - continue home regimen  Chronic constipation     - BM  regimen  Normocytic anemia     - no evidence of bleed, check iron studies  Hypothyroidism     - continue home regimen when confirmed  CAD HLD     - continue home regimen when confirmed  Alzheimer's disease     - continue home regimen when confirmed  Advance Care Planning:   Code Status: FULL  Consults: None  Family Communication: w/ daughter at bedside  Severity of Illness: The appropriate patient status for this patient is INPATIENT. Inpatient status is judged to be reasonable and necessary in order to provide the required intensity of service to ensure the patient's safety. The patient's presenting symptoms, physical exam findings, and initial radiographic and laboratory data in the context of their chronic comorbidities is felt to place them at high risk for further clinical deterioration. Furthermore, it is not anticipated that the patient will be medically stable for discharge from the hospital within 2 midnights of admission.   * I certify that at the point of admission it is my clinical judgment that the patient will require inpatient hospital care spanning beyond 2 midnights from the point of admission due to high intensity of service, high risk for further deterioration and high frequency of surveillance required.*  Author: Jonnie Finner, DO 01/23/2022 4:44 PM  For on call review [email or web address].

## 2022-01-23 NOTE — ED Triage Notes (Signed)
Pt brought in by family.   Pt was diagnosed with pneumonia yesterday and started on antibiotics. Today pt is more shob today and 02 at home 88% per family.   A/O4   Pt fell 10 days ago and has injured foot. No issues about foot today.

## 2022-01-24 ENCOUNTER — Observation Stay (HOSPITAL_COMMUNITY): Payer: Medicare Other

## 2022-01-24 DIAGNOSIS — Y92009 Unspecified place in unspecified non-institutional (private) residence as the place of occurrence of the external cause: Secondary | ICD-10-CM

## 2022-01-24 DIAGNOSIS — J129 Viral pneumonia, unspecified: Secondary | ICD-10-CM | POA: Diagnosis present

## 2022-01-24 DIAGNOSIS — Z7982 Long term (current) use of aspirin: Secondary | ICD-10-CM | POA: Diagnosis not present

## 2022-01-24 DIAGNOSIS — G309 Alzheimer's disease, unspecified: Secondary | ICD-10-CM

## 2022-01-24 DIAGNOSIS — I5032 Chronic diastolic (congestive) heart failure: Secondary | ICD-10-CM | POA: Diagnosis present

## 2022-01-24 DIAGNOSIS — K5909 Other constipation: Secondary | ICD-10-CM | POA: Diagnosis present

## 2022-01-24 DIAGNOSIS — M7989 Other specified soft tissue disorders: Secondary | ICD-10-CM

## 2022-01-24 DIAGNOSIS — W1830XA Fall on same level, unspecified, initial encounter: Secondary | ICD-10-CM | POA: Diagnosis present

## 2022-01-24 DIAGNOSIS — R0902 Hypoxemia: Secondary | ICD-10-CM | POA: Diagnosis present

## 2022-01-24 DIAGNOSIS — F0280 Dementia in other diseases classified elsewhere without behavioral disturbance: Secondary | ICD-10-CM

## 2022-01-24 DIAGNOSIS — I110 Hypertensive heart disease with heart failure: Secondary | ICD-10-CM | POA: Diagnosis present

## 2022-01-24 DIAGNOSIS — Z79890 Hormone replacement therapy (postmenopausal): Secondary | ICD-10-CM | POA: Diagnosis not present

## 2022-01-24 DIAGNOSIS — I495 Sick sinus syndrome: Secondary | ICD-10-CM | POA: Diagnosis present

## 2022-01-24 DIAGNOSIS — Z79899 Other long term (current) drug therapy: Secondary | ICD-10-CM | POA: Diagnosis not present

## 2022-01-24 DIAGNOSIS — W19XXXA Unspecified fall, initial encounter: Secondary | ICD-10-CM

## 2022-01-24 DIAGNOSIS — Z20822 Contact with and (suspected) exposure to covid-19: Secondary | ICD-10-CM | POA: Diagnosis present

## 2022-01-24 DIAGNOSIS — J189 Pneumonia, unspecified organism: Secondary | ICD-10-CM | POA: Diagnosis present

## 2022-01-24 DIAGNOSIS — I10 Essential (primary) hypertension: Secondary | ICD-10-CM | POA: Diagnosis not present

## 2022-01-24 DIAGNOSIS — Z955 Presence of coronary angioplasty implant and graft: Secondary | ICD-10-CM | POA: Diagnosis not present

## 2022-01-24 DIAGNOSIS — E039 Hypothyroidism, unspecified: Secondary | ICD-10-CM | POA: Diagnosis present

## 2022-01-24 DIAGNOSIS — B9789 Other viral agents as the cause of diseases classified elsewhere: Secondary | ICD-10-CM | POA: Diagnosis present

## 2022-01-24 DIAGNOSIS — Z85828 Personal history of other malignant neoplasm of skin: Secondary | ICD-10-CM | POA: Diagnosis not present

## 2022-01-24 DIAGNOSIS — Z9079 Acquired absence of other genital organ(s): Secondary | ICD-10-CM | POA: Diagnosis not present

## 2022-01-24 DIAGNOSIS — S92352A Displaced fracture of fifth metatarsal bone, left foot, initial encounter for closed fracture: Secondary | ICD-10-CM | POA: Diagnosis present

## 2022-01-24 DIAGNOSIS — S92002A Unspecified fracture of left calcaneus, initial encounter for closed fracture: Secondary | ICD-10-CM | POA: Diagnosis present

## 2022-01-24 DIAGNOSIS — Z888 Allergy status to other drugs, medicaments and biological substances status: Secondary | ICD-10-CM | POA: Diagnosis not present

## 2022-01-24 DIAGNOSIS — E785 Hyperlipidemia, unspecified: Secondary | ICD-10-CM | POA: Diagnosis present

## 2022-01-24 DIAGNOSIS — I2510 Atherosclerotic heart disease of native coronary artery without angina pectoris: Secondary | ICD-10-CM | POA: Diagnosis present

## 2022-01-24 DIAGNOSIS — D649 Anemia, unspecified: Secondary | ICD-10-CM | POA: Diagnosis present

## 2022-01-24 DIAGNOSIS — Z7952 Long term (current) use of systemic steroids: Secondary | ICD-10-CM | POA: Diagnosis not present

## 2022-01-24 LAB — CBC
HCT: 31.6 % — ABNORMAL LOW (ref 36.0–46.0)
Hemoglobin: 9.7 g/dL — ABNORMAL LOW (ref 12.0–15.0)
MCH: 27.8 pg (ref 26.0–34.0)
MCHC: 30.7 g/dL (ref 30.0–36.0)
MCV: 90.5 fL (ref 80.0–100.0)
Platelets: 295 10*3/uL (ref 150–400)
RBC: 3.49 MIL/uL — ABNORMAL LOW (ref 3.87–5.11)
RDW: 17.1 % — ABNORMAL HIGH (ref 11.5–15.5)
WBC: 8.6 10*3/uL (ref 4.0–10.5)
nRBC: 0 % (ref 0.0–0.2)

## 2022-01-24 LAB — COMPREHENSIVE METABOLIC PANEL
ALT: 15 U/L (ref 0–44)
AST: 22 U/L (ref 15–41)
Albumin: 2.6 g/dL — ABNORMAL LOW (ref 3.5–5.0)
Alkaline Phosphatase: 71 U/L (ref 38–126)
Anion gap: 9 (ref 5–15)
BUN: 20 mg/dL (ref 8–23)
CO2: 26 mmol/L (ref 22–32)
Calcium: 8.3 mg/dL — ABNORMAL LOW (ref 8.9–10.3)
Chloride: 103 mmol/L (ref 98–111)
Creatinine, Ser: 0.85 mg/dL (ref 0.44–1.00)
GFR, Estimated: >60 mL/min (ref >60)
Glucose, Bld: 85 mg/dL (ref 70–99)
Potassium: 3.5 mmol/L (ref 3.5–5.1)
Sodium: 138 mmol/L (ref 135–145)
Total Bilirubin: 0.7 mg/dL (ref 0.3–1.2)
Total Protein: 5.7 g/dL — ABNORMAL LOW (ref 6.5–8.1)

## 2022-01-24 LAB — PROCALCITONIN: Procalcitonin: <0.1 ng/mL

## 2022-01-24 MED ORDER — TRAMADOL HCL 50 MG PO TABS
50.0000 mg | ORAL_TABLET | Freq: Four times a day (QID) | ORAL | Status: DC | PRN
  Administered 2022-01-24 (×2): 50 mg via ORAL
  Filled 2022-01-24 (×2): qty 1

## 2022-01-24 MED ORDER — METHYLPREDNISOLONE SODIUM SUCC 40 MG IJ SOLR
40.0000 mg | Freq: Every day | INTRAMUSCULAR | Status: DC
  Administered 2022-01-24 – 2022-01-27 (×4): 40 mg via INTRAVENOUS
  Filled 2022-01-24 (×4): qty 1

## 2022-01-24 MED ORDER — GUAIFENESIN-DM 100-10 MG/5ML PO SYRP
10.0000 mL | ORAL_SOLUTION | ORAL | Status: DC | PRN
  Administered 2022-01-25 – 2022-01-26 (×3): 10 mL via ORAL
  Filled 2022-01-24 (×3): qty 10

## 2022-01-24 MED ORDER — BENZONATATE 100 MG PO CAPS
100.0000 mg | ORAL_CAPSULE | Freq: Three times a day (TID) | ORAL | Status: DC
  Administered 2022-01-24 – 2022-01-27 (×9): 100 mg via ORAL
  Filled 2022-01-24 (×9): qty 1

## 2022-01-24 NOTE — Progress Notes (Signed)
OT Cancellation Note  Patient Details Name: Jasmin Crane MRN: [id number] DOB: 16-Oct-1931   Cancelled Treatment:    Reason Eval/Treat Not Completed: Other (comment);Patient at procedure or test/ unavailable (Lower extremity venous duplex currently being performed. OT will reattempt as able.Shanon Payor, OTD OTR/L  01/24/22, 11:41 AM

## 2022-01-24 NOTE — Progress Notes (Signed)
Right lower extremity venous duplex has been completed. Preliminary results can be found in CV Proc through chart review.   01/24/22 11:04 AM Jasmin Crane RVT

## 2022-01-24 NOTE — Progress Notes (Signed)
PROGRESS NOTE    Jasmin Crane  TSV:[id number] DOB: Jan 17, 1932 DOA: 01/23/2022 PCP: Hayden Rasmussen, MD   Brief Narrative:  88 y.o. female with medical history significant of chronic HFpEF, HLD, CAD, Alzheimer's disease, recent diagnosis of left foot fracture presented with worsening shortness of breath despite being on oral antibiotics/steroids as an outpatient.  On presentation, procalcitonin was less than 0.1; chest x-ray showed streaky left lower lobe airspace opacity, atelectasis versus pneumonia.  She was started on IV antibiotics.  Assessment & Plan:   Possible left lower lobe viral pneumonia/viral bronchitis Hypoxia -Presented with worsening shortness of breath and cough.  Chest x-ray as above.  Currently on IV antibiotics.  Respiratory virus panel positive for rhinovirus.  DC antibiotics.  Start IV steroids. -Currently requiring 2 L oxygen via nasal cannula.  Wean off as able.  Recent fall Left calcaneal fracture/left fifth metatarsal fracture -Patient had a recent diagnosis of left foot fracture and was discharged from the ED with a boot with outpatient follow-up with orthopedics. -Stressed importance of outpatient follow-up with orthopedics. -PT eval.  Chronic diastolic CHF -Currently compensated.  Strict input and output.  Daily weights.  Continue metoprolol succinate  CAD Hyperlipidemia -Continue aspirin, metoprolol, statin.  Stable.  Outpatient follow-up with cardiology  Alzheimer's disease -Stable.  Fall precautions.  Continue donepezil and citalopram  Hypothyroidism--continue levothyroxine  Physical deconditioning-PT eval   DVT prophylaxis: Start Lovenox Code Status: Full Family Communication: Daughter at bedside Disposition Plan: Status is: Observation The patient will require care spanning > 2 midnights and should be moved to inpatient because: Of severity of illness.  Need for IV steroids.  Still has significant cough and shortness of  breath    Consultants: None  Procedures: None  Antimicrobials: Rocephin and Zithromax from 01/23/2022 onwards   Subjective: Patient seen and examined at bedside.  Still complains of significant cough.  Still slightly less short of breath with exertion.  Daughter present at bedside.  Does not feel well; patient is a poor historian.  No overnight fever, vomiting reported.  Objective: Vitals:   01/23/22 2303 01/24/22 0402 01/24/22 0500 01/24/22 0808  BP: (!) 131/57 139/65  (!) 116/46  Pulse: (!) 57 (!) 56  63  Resp: '16 16  18  '$ Temp: 98.2 F (36.8 C) 97.9 F (36.6 C)  97.7 F (36.5 C)  TempSrc: Oral Oral  Oral  SpO2: 98% 97%  91%  Weight:   102.8 kg   Height:        Intake/Output Summary (Last 24 hours) at 01/24/2022 0941 Last data filed at 01/24/2022 0814 Gross per 24 hour  Intake 340 ml  Output 700 ml  Net -360 ml   Filed Weights   01/23/22 1319 01/24/22 0500  Weight: 46.3 kg 102.8 kg    Examination:  General exam: Appears calm and comfortable.  Currently on 2 L oxygen via nasal cannula.   Respiratory system: Bilateral decreased breath sounds at bases with some scattered crackles Cardiovascular system: S1 & S2 heard, mildly bradycardic intermittently gastrointestinal system: Abdomen is nondistended, soft and nontender. Normal bowel sounds heard. Extremities: No cyanosis, clubbing; trace lower extremity edema  Central nervous system: Awake, slow to respond, poor historian.  No focal neurological deficits. Moving extremities Skin: No rashes, lesions or ulcers Psychiatry: Affect is mostly flat.  No signs of agitation.    Data Reviewed: I have personally reviewed following labs and imaging studies  CBC: Recent Labs  Lab 01/23/22 1540 01/24/22 0724  WBC 10.9* 8.6  NEUTROABS  9.7*  --   HGB 10.5* 9.7*  HCT 34.3* 31.6*  MCV 90.7 90.5  PLT 315 053   Basic Metabolic Panel: Recent Labs  Lab 01/23/22 1540 01/24/22 0724  NA 136 138  K 3.9 3.5  CL 104 103  CO2  23 26  GLUCOSE 126* 85  BUN 19 20  CREATININE 0.99 0.85  CALCIUM 8.6* 8.3*   GFR: Estimated Creatinine Clearance: 51.4 mL/min (by C-G formula based on SCr of 0.85 mg/dL). Liver Function Tests: Recent Labs  Lab 01/24/22 0724  AST 22  ALT 15  ALKPHOS 71  BILITOT 0.7  PROT 5.7*  ALBUMIN 2.6*   No results for input(s): LIPASE, AMYLASE in the last 168 hours. No results for input(s): AMMONIA in the last 168 hours. Coagulation Profile: No results for input(s): INR, PROTIME in the last 168 hours. Cardiac Enzymes: No results for input(s): CKTOTAL, CKMB, CKMBINDEX, TROPONINI in the last 168 hours. BNP (last 3 results) No results for input(s): PROBNP in the last 8760 hours. HbA1C: No results for input(s): HGBA1C in the last 72 hours. CBG: No results for input(s): GLUCAP in the last 168 hours. Lipid Profile: No results for input(s): CHOL, HDL, LDLCALC, TRIG, CHOLHDL, LDLDIRECT in the last 72 hours. Thyroid Function Tests: Recent Labs    01/23/22 1540  TSH 2.546   Anemia Panel: No results for input(s): VITAMINB12, FOLATE, FERRITIN, TIBC, IRON, RETICCTPCT in the last 72 hours. Sepsis Labs: Recent Labs  Lab 01/23/22 1540 01/24/22 0724  PROCALCITON <0.10 <0.10    Recent Results (from the past 240 hour(s))  Resp Panel by RT-PCR (Flu A&B, Covid) Anterior Nasal Swab     Status: None   Collection Time: 01/23/22  5:30 PM   Specimen: Anterior Nasal Swab  Result Value Ref Range Status   SARS Coronavirus 2 by RT PCR NEGATIVE NEGATIVE Final    Comment: (NOTE) SARS-CoV-2 target nucleic acids are NOT DETECTED.  The SARS-CoV-2 RNA is generally detectable in upper respiratory specimens during the acute phase of infection. The lowest concentration of SARS-CoV-2 viral copies this assay can detect is 138 copies/mL. A negative result does not preclude SARS-Cov-2 infection and should not be used as the sole basis for treatment or other patient management decisions. A negative result may  occur with  improper specimen collection/handling, submission of specimen other than nasopharyngeal swab, presence of viral mutation(s) within the areas targeted by this assay, and inadequate number of viral copies(<138 copies/mL). A negative result must be combined with clinical observations, patient history, and epidemiological information. The expected result is Negative.  Fact Sheet for Patients:  EntrepreneurPulse.com.au  Fact Sheet for Healthcare Providers:  IncredibleEmployment.be  This test is no t yet approved or cleared by the [place] FDA and  has been authorized for detection and/or diagnosis of SARS-CoV-2 by FDA under an Emergency Use Authorization (EUA). This EUA will remain  in effect (meaning this test can be used) for the duration of the COVID-19 declaration under Section 564(b)(1) of the Act, 21 U.S.C.section 360bbb-3(b)(1), unless the authorization is terminated  or revoked sooner.       Influenza A by PCR NEGATIVE NEGATIVE Final   Influenza B by PCR NEGATIVE NEGATIVE Final    Comment: (NOTE) The Xpert Xpress SARS-CoV-2/FLU/RSV plus assay is intended as an aid in the diagnosis of influenza from Nasopharyngeal swab specimens and should not be used as a sole basis for treatment. Nasal washings and aspirates are unacceptable for Xpert Xpress SARS-CoV-2/FLU/RSV testing.  Fact Sheet for  Patients: EntrepreneurPulse.com.au  Fact Sheet for Healthcare Providers: IncredibleEmployment.be  This test is not yet approved or cleared by the [place] FDA and has been authorized for detection and/or diagnosis of SARS-CoV-2 by FDA under an Emergency Use Authorization (EUA). This EUA will remain in effect (meaning this test can be used) for the duration of the COVID-19 declaration under Section 564(b)(1) of the Act, 21 U.S.C. section 360bbb-3(b)(1), unless the authorization is terminated  or revoked.  Performed at [facility], [place] [address] [place], [place] [address]   Respiratory (~20 pathogens) panel by PCR     Status: Abnormal   Collection Time: 01/23/22  5:30 PM   Specimen: Anterior Nasal Swab; Respiratory  Result Value Ref Range Status   Adenovirus NOT DETECTED NOT DETECTED Final   Coronavirus 229E NOT DETECTED NOT DETECTED Final    Comment: (NOTE) The Coronavirus on the Respiratory Panel, DOES NOT test for the novel  Coronavirus (2019 nCoV)    Coronavirus HKU1 NOT DETECTED NOT DETECTED Final   Coronavirus NL63 NOT DETECTED NOT DETECTED Final   Coronavirus OC43 NOT DETECTED NOT DETECTED Final   Metapneumovirus NOT DETECTED NOT DETECTED Final   Rhinovirus / Enterovirus DETECTED (A) NOT DETECTED Final   Influenza A NOT DETECTED NOT DETECTED Final   Influenza B NOT DETECTED NOT DETECTED Final   Parainfluenza Virus 1 NOT DETECTED NOT DETECTED Final   Parainfluenza Virus 2 NOT DETECTED NOT DETECTED Final   Parainfluenza Virus 3 NOT DETECTED NOT DETECTED Final   Parainfluenza Virus 4 NOT DETECTED NOT DETECTED Final   Respiratory Syncytial Virus NOT DETECTED NOT DETECTED Final   Bordetella pertussis NOT DETECTED NOT DETECTED Final   Bordetella Parapertussis NOT DETECTED NOT DETECTED Final   Chlamydophila pneumoniae NOT DETECTED NOT DETECTED Final   Mycoplasma pneumoniae NOT DETECTED NOT DETECTED Final    Comment: Performed at [facility], [place]. [address] [place], [place] [address]         Radiology Studies: DG Chest Port 1 View  Result Date: 01/23/2022 CLINICAL DATA:  Shortness of breath EXAM: PORTABLE CHEST 1 VIEW COMPARISON:  03/08/2021 FINDINGS: Stable cardiomediastinal contours. Aortic atherosclerosis. Streaky left lower lobe airspace opacity. No large pleural fluid collection. No pneumothorax. Bones are demineralized. Healed proximal right humeral deformity. IMPRESSION: Streaky left lower lobe airspace opacity, atelectasis  versus pneumonia. Electronically Signed   By: Davina Poke D.O.   On: 01/23/2022 15:53   DG Foot Complete Left  Result Date: 01/23/2022 CLINICAL DATA:  Fall.  Heel and lateral foot pain EXAM: LEFT FOOT - COMPLETE 3+ VIEW COMPARISON:  None Available. FINDINGS: There is a fracture noted through the calcaneus on the lateral view. Lucency also noted at the base of the left 5th metatarsal concerning for fracture. No subluxation or dislocation. Soft tissues are intact. IMPRESSION: Left calcaneal fracture. Fracture at the base of the left 5th metatarsal. Electronically Signed   By: Rolm Baptise M.D.   On: 01/23/2022 19:59        Scheduled Meds:  aspirin EC  81 mg Oral Daily   citalopram  10 mg Oral Daily   donepezil  10 mg Oral Daily   guaiFENesin  600 mg Oral BID   levothyroxine  88 mcg Oral Q0600   metoprolol succinate  25 mg Oral Daily   rosuvastatin  40 mg Oral QHS   Continuous Infusions:  cefTRIAXone (ROCEPHIN)  IV            Aline August, MD [facility] 01/24/2022, 9:41 AM

## 2022-01-24 NOTE — Plan of Care (Signed)
  Problem: Activity: Goal: Ability to tolerate increased activity will improve Outcome: Progressing   Problem: Coping: Goal: Level of anxiety will decrease Outcome: Progressing   Problem: Pain Managment: Goal: General experience of comfort will improve Outcome: Progressing   Problem: Safety: Goal: Ability to remain free from injury will improve Outcome: Progressing

## 2022-01-24 NOTE — Evaluation (Signed)
Physical Therapy Evaluation Patient Details Name: Jasmin Crane MRN: [id number] DOB: 1932-02-10 Today's Date: 01/24/2022  History of Present Illness  88 y.o. female  adm with cough and shortness of breath, dx with pna. recent L calcaneus fx d/t fall. PMH: chronic HFpEF, HLD, CAD, AAA,  Alzheimer's disease, R humerus fx  and R 5th metatarsal fx 2022  Clinical Impression  Pt admitted with above diagnosis.  Pt agreeable only with strong encouragement, demonstrates decr insight into medical issues and deficits.  Pt is limited by weakness, pain, deconditioning. reported dizziness after seated in chair; BP 136/60, SpO2=86% on RA (pt removed O2). O2 replaced with SpO2=91% on 2L. RN aware.  Will benefit from SNF post acute .   Pt currently with functional limitations due to the deficits listed below (see PT Problem List). Pt will benefit from skilled PT to increase their independence and safety with mobility to allow discharge to the venue listed below.          Recommendations for follow up therapy are one component of a multi-disciplinary discharge planning process, led by the attending physician.  Recommendations may be updated based on patient status, additional functional criteria and insurance authorization.  Follow Up Recommendations Skilled nursing-short term rehab (<3 hours/day)    Assistance Recommended at Discharge Frequent or constant Supervision/Assistance  Patient can return home with the following  A little help with walking and/or transfers;A little help with bathing/dressing/bathroom;Assistance with cooking/housework;Help with stairs or ramp for entrance;Direct supervision/assist for medications management;Assist for transportation;Direct supervision/assist for financial management    Equipment Recommendations None recommended by PT  Recommendations for Other Services       Functional Status Assessment Patient has had a recent decline in their functional status and  demonstrates the ability to make significant improvements in function in a reasonable and predictable amount of time.     Precautions / Restrictions Precautions Precautions: Fall Required Braces or Orthoses: Other Brace Other Brace: camboot on LLE; pt had changed from boot to post op shoe on her own bc she could not "do" the boot Restrictions Weight Bearing Restrictions: Yes LLE Weight Bearing: Partial weight bearing LLE Partial Weight Bearing Percentage or Pounds: PWB in camboot, % not specified--per secure chat iwth Costella Hatcher      Mobility  Bed Mobility Overal bed mobility: Needs Assistance Bed Mobility: Supine to Sit     Supine to sit: Min guard     General bed mobility comments: incr time, cues for task completion    Transfers Overall transfer level: Needs assistance Equipment used: Rolling walker (2 wheels) Transfers: Sit to/from Stand, Bed to chair/wheelchair/BSC Sit to Stand: Min assist   Step pivot transfers: Mod assist       General transfer comment: multi-modal cues for hand placement, safety, self assist; mod assist to complete turn and lower pt to chair, pt stopped advancing LEs to complete turn    Ambulation/Gait               General Gait Details: pt declined  Stairs            Wheelchair Mobility    Modified Rankin (Stroke Patients Only)       Balance Overall balance assessment: Needs assistance, History of Falls Sitting-balance support: No upper extremity supported, Feet supported Sitting balance-Leahy Scale: Fair     Standing balance support: Reliant on assistive device for balance, During functional activity, Bilateral upper extremity supported Standing balance-Leahy Scale: Poor  Pertinent Vitals/Pain Pain Assessment Pain Assessment: Faces Faces Pain Scale: Hurts little more Pain Location: L foot Pain Descriptors / Indicators: Grimacing Pain Intervention(s): Limited activity  within patient's tolerance, Monitored during session, Premedicated before session, Repositioned    Home Living Family/patient expects to be discharged to:: Private residence Living Arrangements: Alone Available Help at Discharge: [place] Access: Stairs to enter           Additional Comments: per previous notes pt lives alone in one level home with one step entry    Prior Function Prior Level of Function : Patient poor historian/Family not available                     Hand Dominance        Extremity/Trunk Assessment   Upper Extremity Assessment Upper Extremity Assessment: Defer to OT evaluation;Generalized weakness    Lower Extremity Assessment Lower Extremity Assessment: Generalized weakness;LLE deficits/detail LLE Deficits / Details: knee and hip grossly 2+/5; camboot placed by PT with ankle in neutral LLE: Unable to fully assess due to immobilization       Communication   Communication: No difficulties  Cognition Arousal/Alertness: Awake/alert   Overall Cognitive Status: History of cognitive impairments - at baseline                                 General Comments: easily annoyed by PT, RN reports same;        General Comments      Exercises     Assessment/Plan    PT Assessment Patient needs continued PT services  PT Problem List Decreased strength;Decreased mobility;Decreased safety awareness;Decreased activity tolerance;Decreased balance;Decreased knowledge of use of DME;Pain;Decreased cognition       PT Treatment Interventions DME instruction;Therapeutic exercise;Gait training;Functional mobility training;Therapeutic activities;Patient/family education    PT Goals (Current goals can be found in the Care Plan section)  Acute Rehab PT Goals PT Goal Formulation: Patient unable to participate in goal setting Time For Goal Achievement: 02/07/22 Potential to Achieve Goals: Good    Frequency Min  2X/week     Co-evaluation               AM-PAC PT "6 Clicks" Mobility  Outcome Measure Help needed turning from your back to your side while in a flat bed without using bedrails?: A Lot Help needed moving from lying on your back to sitting on the side of a flat bed without using bedrails?: A Little Help needed moving to and from a bed to a chair (including a wheelchair)?: A Lot Help needed standing up from a chair using your arms (e.g., wheelchair or bedside chair)?: A Lot Help needed to walk in hospital room?: Total Help needed climbing 3-5 steps with a railing? : Total 6 Click Score: 11    End of Session Equipment Utilized During Treatment: Gait belt Activity Tolerance: Patient limited by fatigue Patient left: in chair;with call bell/phone within reach;with chair alarm set Nurse Communication: Mobility status PT Visit Diagnosis: Other abnormalities of gait and mobility (R26.89);Difficulty in walking, not elsewhere classified (R26.2)    Time: 9924-2683 PT Time Calculation (min) (ACUTE ONLY): 45 min   Charges:   PT Evaluation $PT Eval Moderate Complexity: 1 Mod PT Treatments $Therapeutic Activity: 23-37 mins        Baxter Flattery, PT  Acute Rehab Dept ([facility]) [phone] Pager [phone]  01/24/2022   [facility] 01/24/2022, 3:27 PM

## 2022-01-24 NOTE — TOC Initial Note (Signed)
Transition of Care [facility]) - Initial/Assessment Note    Patient Details  Name: Jasmin Crane MRN: [id number] Date of Birth: February 02, 1932  [facility]) CM/SW Contact:    Dessa Phi, RN Phone Number: 01/24/2022, 2:29 PM  Clinical Narrative:  Patient home alone.Noted Alzheimers-spoke to [facility] dtr-CM informed will await recc to asst w/discharge plans.                 Expected Discharge Plan:  (TBD) Barriers to Discharge: Continued Medical Work up   Patient Goals and CMS Choice Patient states their goals for this hospitalization and ongoing recovery are:: Home CMS Medicare.gov Compare Post Acute Care list provided to:: Patient Represenative (must comment) (Madeline dtr)    Expected Discharge Plan and Services Expected Discharge Plan:  (TBD)   Discharge Planning Services: CM Consult Post Acute Care Choice: [place] arrangements for the past 2 months: Single Family Home                                      Prior Living Arrangements/Services Living arrangements for the past 2 months: Single Family Home Lives with:: Self Patient language and need for interpreter reviewed:: Yes Do you feel safe going back to the place where you live?: Yes        Care giver support system in place?: Yes (comment) Current home services: DME (cane, rw,w/c) Criminal Activity/Legal Involvement Pertinent to Current Situation/Hospitalization: No - Comment as needed  Activities of Daily Living      Permission Sought/Granted Permission sought to share information with : Case Manager Permission granted to share information with : Yes, Verbal Permission Granted  Share Information with NAME: Case Manager           Emotional Assessment Appearance:: Appears stated age Attitude/Demeanor/Rapport: Gracious Affect (typically observed): Accepting Orientation: : Oriented to Self, Oriented to Place, Oriented to Situation Alcohol / Substance Use: Not Applicable Psych  Involvement: No (comment)  Admission diagnosis:  Pneumonia [J18.9] Hypoxia [R09.02] Community acquired pneumonia, unspecified laterality [J18.9] Patient Active Problem List   Diagnosis Date Noted   Pneumonia 01/23/2022   Fall at home, initial encounter 01/23/2022   Left foot pain 01/23/2022   Chronic constipation 01/23/2022   Chronic diastolic CHF (congestive heart failure) ([place]) 01/23/2022   Hypothyroidism 01/23/2022   Alzheimer disease ([place]) 01/23/2022   Emphysema lung ([place]) 03/09/2021   Pleural effusion 03/09/2021   Cardiomegaly 03/09/2021   Acute respiratory failure with hypoxia ([place]) 03/08/2021   Educated about COVID-19 virus infection 05/30/2020   Dyslipidemia 05/28/2019   Medication management 06/10/2018   Other fatigue 06/10/2018   PVD (peripheral vascular disease) ([place]) 06/10/2018   Right lower quadrant abdominal pain 06/10/2018   AAA (abdominal aortic aneurysm) without rupture ([place]) 06/10/2018   Aortic valve regurgitation 06/10/2018   Carotid artery disease ([place]) 03/06/2017   Claudication in peripheral vascular disease ([place]) 03/06/2017   Essential hypertension 03/06/2017   Hyperlipidemia 03/06/2017   Coronary artery disease due to lipid rich plaque 03/06/2017   PCP:  Hayden Rasmussen, MD Pharmacy:   [facility] [place] [facility] [place], [place] - [address] [place] [address] Phone: [phone] Fax: [phone]     Social Determinants of Health (SDOH) Interventions    Readmission Risk Interventions     View : No data to display.

## 2022-01-25 DIAGNOSIS — G309 Alzheimer's disease, unspecified: Secondary | ICD-10-CM | POA: Diagnosis not present

## 2022-01-25 DIAGNOSIS — J189 Pneumonia, unspecified organism: Secondary | ICD-10-CM | POA: Diagnosis not present

## 2022-01-25 DIAGNOSIS — I10 Essential (primary) hypertension: Secondary | ICD-10-CM

## 2022-01-25 DIAGNOSIS — I5032 Chronic diastolic (congestive) heart failure: Secondary | ICD-10-CM | POA: Diagnosis not present

## 2022-01-25 MED ORDER — ENOXAPARIN SODIUM 40 MG/0.4ML IJ SOSY
40.0000 mg | PREFILLED_SYRINGE | INTRAMUSCULAR | Status: DC
  Administered 2022-01-25 – 2022-01-27 (×3): 40 mg via SUBCUTANEOUS
  Filled 2022-01-25 (×3): qty 0.4

## 2022-01-25 NOTE — TOC Progression Note (Addendum)
Transition of Care [facility]) - Progression Note    Patient Details  Name: Greenleigh Kauth MRN: [id number] Date of Birth: 1932-06-15  [facility] Care [facility]) CM/SW Contact  Quinette Hentges, Juliann Pulse, RN Phone Number: 01/25/2022, 10:22 AM  Clinical Narrative:Patient/[facility]eline dtr agree to SNF. Faxed out await bed offers.Prefer [facility]-has gone there in past.      Expected Discharge Plan: [place] Barriers to Discharge: Continued Medical Work up  Expected Discharge Plan and Services Expected Discharge Plan: [place]   Discharge Planning Services: CM Consult Post Acute Care Choice: [place] arrangements for the past 2 months: Single Family Home                                       Social Determinants of Health (SDOH) Interventions    Readmission Risk Interventions     View : No data to display.

## 2022-01-25 NOTE — Evaluation (Signed)
Occupational Therapy Evaluation Patient Details Name: Jasmin Crane MRN: [id number] DOB: 15-May-1932 Today's Date: 01/25/2022   History of Present Illness 88 y.o. female  adm with cough and shortness of breath, dx with pna. recent L calcaneus fx d/t fall. PMH: chronic HFpEF, HLD, CAD, AAA,  Alzheimer's disease, R humerus fx  and R 5th metatarsal fx 2022   Clinical Impression   Patient is a 88 year old female who was admitted for above. Patient was living at home alone prior level. Currently patient is easily frustrated with education on WB restrictions with inability to carryover education provided. Patient was noted to become less frustrated with practical application but remains confused about WB restrictions and how to maintain them. Patient would continue to benefit from skilled OT services at this time while admitted and after d/c to address noted deficits in order to improve overall safety and independence in ADLs.         Recommendations for follow up therapy are one component of a multi-disciplinary discharge planning process, led by the attending physician.  Recommendations may be updated based on patient status, additional functional criteria and insurance authorization.   Follow Up Recommendations  Skilled nursing-short term rehab (<3 hours/day)    Assistance Recommended at Discharge Frequent or constant Supervision/Assistance  Patient can return home with the following A lot of help with walking and/or transfers;A lot of help with bathing/dressing/bathroom;Assistance with cooking/housework;Direct supervision/assist for financial management;Help with stairs or ramp for entrance;Direct supervision/assist for medications management;Assist for transportation    Functional Status Assessment  Patient has had a recent decline in their functional status and demonstrates the ability to make significant improvements in function in a reasonable and predictable amount of time.  Equipment  Recommendations  Other (comment) (defer to next venue)    Recommendations for Other Services       Precautions / Restrictions Precautions Precautions: Fall Required Braces or Orthoses: Other Brace Other Brace: camboot on LLE; pt had changed from boot to post op shoe on her own bc she could not "do" the boot Restrictions Weight Bearing Restrictions: Yes LLE Weight Bearing: Partial weight bearing LLE Partial Weight Bearing Percentage or Pounds: PWB in camboot, % not specified--per secure chat iwth Costella Hatcher      Mobility Bed Mobility Overal bed mobility: Needs Assistance Bed Mobility: Supine to Sit     Supine to sit: Min guard     General bed mobility comments: incr time, cues for task completion    Transfers                          Balance Overall balance assessment: Needs assistance, History of Falls Sitting-balance support: No upper extremity supported, Feet supported Sitting balance-Leahy Scale: Fair     Standing balance support: Reliant on assistive device for balance, During functional activity, Bilateral upper extremity supported Standing balance-Leahy Scale: Poor                             ADL either performed or assessed with clinical judgement   ADL Overall ADL's : Needs assistance/impaired Eating/Feeding: Set up;Sitting   Grooming: Minimal assistance;Sitting   Upper Body Bathing: Minimal assistance;Sitting   Lower Body Bathing: Maximal assistance;Sitting/lateral leans   Upper Body Dressing : Minimal assistance;Sitting   Lower Body Dressing: Maximal assistance;Sitting/lateral leans Lower Body Dressing Details (indicate cue type and reason): patient was max A to don sock and shoe on RLE  with education on how shoe would help to reduce pressure on LLE and even out posture. patient verbalized understanding. Toilet Transfer: Minimal assistance;Rolling walker (2 wheels) Toilet Transfer Details (indicate cue type and reason):  with increased time to transfer from edge of bed to recliner in room. patient was easily confused with education n WB restrictions and was frustrated with visual and verbal eduaction provided multiple times during session. Toileting- Clothing Manipulation and Hygiene: Maximal assistance;Sit to/from stand               Vision Baseline Vision/History: 1 Wears glasses Patient Visual Report: No change from baseline       Perception     Praxis      Pertinent Vitals/Pain Pain Assessment Pain Assessment: Faces Faces Pain Scale: Hurts little more Pain Location: L foot Pain Descriptors / Indicators: Grimacing Pain Intervention(s): Limited activity within patient's tolerance, Monitored during session, Premedicated before session, Repositioned     Hand Dominance     Extremity/Trunk Assessment Upper Extremity Assessment Upper Extremity Assessment: RUE deficits/detail;Generalized weakness RUE Deficits / Details: patient reported having had a fx in this UE in "July" that was still hurting her. patient was able to ROM to about 75 degrees FF and abduction.   Lower Extremity Assessment Lower Extremity Assessment: Defer to PT evaluation       Communication     Cognition Arousal/Alertness: Awake/alert   Overall Cognitive Status: History of cognitive impairments - at baseline                                 General Comments: patient was easily frustrated by education on how to maintain WB restrictions. daughter was able to help at times with calming patient.     General Comments       Exercises     Shoulder Instructions      Home Living                                          Prior Functioning/Environment                          OT Problem List: Decreased activity tolerance;Impaired balance (sitting and/or standing);Decreased safety awareness;Decreased knowledge of precautions;Decreased knowledge of use of DME or AE      OT  Treatment/Interventions: Self-care/ADL training;Therapeutic exercise;Neuromuscular education;Energy conservation;DME and/or AE instruction;Therapeutic activities;Balance training;Patient/family education    OT Goals(Current goals can be found in the care plan section) Acute Rehab OT Goals Patient Stated Goal: to get stronger OT Goal Formulation: With patient/family Time For Goal Achievement: 02/08/22 Potential to Achieve Goals: Fair  OT Frequency: Min 2X/week    Co-evaluation              AM-PAC OT "6 Clicks" Daily Activity     Outcome Measure Help from another person eating meals?: A Little Help from another person taking care of personal grooming?: A Little Help from another person toileting, which includes using toliet, bedpan, or urinal?: A Lot Help from another person bathing (including washing, rinsing, drying)?: A Lot Help from another person to put on and taking off regular upper body clothing?: A Lot Help from another person to put on and taking off regular lower body clothing?: A Lot 6 Click Score: 14   End of Session Equipment  Utilized During Treatment: Gait belt;Rolling walker (2 wheels) Nurse Communication: Mobility status  Activity Tolerance: Patient tolerated treatment well Patient left: in chair;with call bell/phone within reach;with chair alarm set;with family/visitor present  OT Visit Diagnosis: Unsteadiness on feet (R26.81)                Time: 1035-1110 OT Time Calculation (min): 35 min Charges:  OT General Charges $OT Visit: 1 Visit OT Evaluation $OT Eval Moderate Complexity: 1 Mod OT Treatments $Self Care/Home Management : 8-22 mins  Jackelyn Poling OTR/L, MS Acute Rehabilitation Department Office# [phone] Pager# [phone]   Jasmin Crane 01/25/2022, 3:02 PM

## 2022-01-25 NOTE — Progress Notes (Signed)
PROGRESS NOTE    Jasmin Crane  WUJ:[id number] DOB: 05-18-1932 DOA: 01/23/2022 PCP: Hayden Rasmussen, MD   Brief Narrative:  88 y.o. female with medical history significant of chronic HFpEF, HLD, CAD, Alzheimer's disease, recent diagnosis of left foot fracture presented with worsening shortness of breath despite being on oral antibiotics/steroids as an outpatient.  On presentation, procalcitonin was less than 0.1; chest x-ray showed streaky left lower lobe airspace opacity, atelectasis versus pneumonia.  She was started on IV antibiotics.  Assessment & Plan:   Possible left lower lobe viral pneumonia/viral bronchitis Hypoxia -Presented with worsening shortness of breath and cough.  Chest x-ray as above.  Respiratory virus panel positive for rhinovirus. Off antibiotics.  Continue IV steroids. -Currently requiring 1 L oxygen via nasal cannula.  Wean off as able.  Recent fall Left calcaneal fracture/left fifth metatarsal fracture -Patient had a recent diagnosis of left foot fracture and was discharged from the ED with a boot with outpatient follow-up with orthopedics. -Stressed importance of outpatient follow-up with orthopedics.  Patient will need to follow-up with [facility] as an outpatient: Communicated with Maurice Small for [facility] via secure chat on 01/24/2022 who recommended the same.  Continue partial weightbearing as tolerated -PT recommended SNF placement.  Patient agreeable.  TOC consult.  Chronic diastolic CHF -Currently compensated.  Strict input and output.  Daily weights.  Continue metoprolol succinate  CAD Hyperlipidemia -Continue aspirin, metoprolol, statin.  Stable.  Outpatient follow-up with cardiology  Alzheimer's disease -Stable.  Fall precautions.  Continue donepezil and citalopram  Hypothyroidism--continue levothyroxine  Physical deconditioning-PT eval as above   DVT prophylaxis: Start Lovenox Code Status: Full Family Communication: Daughter at  bedside Disposition Plan: Status is: inpatient because: Of severity of illness.  Need for IV steroids.  Need for SNF placement   Consultants: None  Procedures: None  Antimicrobials: Rocephin and Zithromax on 01/23/2022   Subjective: Patient seen and examined at bedside.  Cough is improving.  Still short of breath with exertion.  Still feels weak.  No overnight fever, vomiting reported. Objective: Vitals:   01/24/22 2130 01/25/22 0500 01/25/22 0519 01/25/22 0523  BP: (!) 138/58  (!) 139/55   Pulse: 62  63   Resp: 18  18   Temp: 97.6 F (36.4 C)  97.7 F (36.5 C)   TempSrc: Oral  Oral   SpO2: 96%  (!) 87% 93%  Weight:  49.5 kg    Height:        Intake/Output Summary (Last 24 hours) at 01/25/2022 0914 Last data filed at 01/25/2022 0500 Gross per 24 hour  Intake 240 ml  Output --  Net 240 ml    Filed Weights   01/23/22 1319 01/24/22 0500 01/25/22 0500  Weight: 46.3 kg 46.6 kg 49.5 kg    Examination:  General: On 1 L oxygen via nasal cannula.  No distress.  Elderly female lying in bed. ENT/neck: No thyromegaly.  JVD is not elevated  respiratory: Decreased breath sounds at bases bilaterally with some crackles; no wheezing  CVS: S1-S2 heard, rate controlled currently Abdominal: Soft, nontender, slightly distended; no organomegaly, normal bowel sounds are heard Extremities: Trace lower extremity edema; no cyanosis  CNS: Awake and alert.  Slow to respond.  No focal neurologic deficit.  Moves extremities Lymph: No obvious lymphadenopathy Skin: No obvious ecchymosis/lesions  psych: Affect is mostly flat.  No signs of agitation.  Musculoskeletal: No obvious joint swelling/deformity     Data Reviewed: I have personally reviewed following labs and imaging studies  CBC: Recent Labs  Lab 01/23/22 1540 01/24/22 0724  WBC 10.9* 8.6  NEUTROABS 9.7*  --   HGB 10.5* 9.7*  HCT 34.3* 31.6*  MCV 90.7 90.5  PLT 315 578    Basic Metabolic Panel: Recent Labs  Lab  01/23/22 1540 01/24/22 0724  NA 136 138  K 3.9 3.5  CL 104 103  CO2 23 26  GLUCOSE 126* 85  BUN 19 20  CREATININE 0.99 0.85  CALCIUM 8.6* 8.3*    GFR: Estimated Creatinine Clearance: 35.8 mL/min (by C-G formula based on SCr of 0.85 mg/dL). Liver Function Tests: Recent Labs  Lab 01/24/22 0724  AST 22  ALT 15  ALKPHOS 71  BILITOT 0.7  PROT 5.7*  ALBUMIN 2.6*    No results for input(s): LIPASE, AMYLASE in the last 168 hours. No results for input(s): AMMONIA in the last 168 hours. Coagulation Profile: No results for input(s): INR, PROTIME in the last 168 hours. Cardiac Enzymes: No results for input(s): CKTOTAL, CKMB, CKMBINDEX, TROPONINI in the last 168 hours. BNP (last 3 results) No results for input(s): PROBNP in the last 8760 hours. HbA1C: No results for input(s): HGBA1C in the last 72 hours. CBG: No results for input(s): GLUCAP in the last 168 hours. Lipid Profile: No results for input(s): CHOL, HDL, LDLCALC, TRIG, CHOLHDL, LDLDIRECT in the last 72 hours. Thyroid Function Tests: Recent Labs    01/23/22 1540  TSH 2.546    Anemia Panel: No results for input(s): VITAMINB12, FOLATE, FERRITIN, TIBC, IRON, RETICCTPCT in the last 72 hours. Sepsis Labs: Recent Labs  Lab 01/23/22 1540 01/24/22 0724  PROCALCITON <0.10 <0.10     Recent Results (from the past 240 hour(s))  Resp Panel by RT-PCR (Flu A&B, Covid) Anterior Nasal Swab     Status: None   Collection Time: 01/23/22  5:30 PM   Specimen: Anterior Nasal Swab  Result Value Ref Range Status   SARS Coronavirus 2 by RT PCR NEGATIVE NEGATIVE Final    Comment: (NOTE) SARS-CoV-2 target nucleic acids are NOT DETECTED.  The SARS-CoV-2 RNA is generally detectable in upper respiratory specimens during the acute phase of infection. The lowest concentration of SARS-CoV-2 viral copies this assay can detect is 138 copies/mL. A negative result does not preclude SARS-Cov-2 infection and should not be used as the sole  basis for treatment or other patient management decisions. A negative result may occur with  improper specimen collection/handling, submission of specimen other than nasopharyngeal swab, presence of viral mutation(s) within the areas targeted by this assay, and inadequate number of viral copies(<138 copies/mL). A negative result must be combined with clinical observations, patient history, and epidemiological information. The expected result is Negative.  Fact Sheet for Patients:  EntrepreneurPulse.com.au  Fact Sheet for Healthcare Providers:  IncredibleEmployment.be  This test is no t yet approved or cleared by the [place] FDA and  has been authorized for detection and/or diagnosis of SARS-CoV-2 by FDA under an Emergency Use Authorization (EUA). This EUA will remain  in effect (meaning this test can be used) for the duration of the COVID-19 declaration under Section 564(b)(1) of the Act, 21 U.S.C.section 360bbb-3(b)(1), unless the authorization is terminated  or revoked sooner.       Influenza A by PCR NEGATIVE NEGATIVE Final   Influenza B by PCR NEGATIVE NEGATIVE Final    Comment: (NOTE) The Xpert Xpress SARS-CoV-2/FLU/RSV plus assay is intended as an aid in the diagnosis of influenza from Nasopharyngeal swab specimens and should not be used as  a sole basis for treatment. Nasal washings and aspirates are unacceptable for Xpert Xpress SARS-CoV-2/FLU/RSV testing.  Fact Sheet for Patients: EntrepreneurPulse.com.au  Fact Sheet for Healthcare Providers: IncredibleEmployment.be  This test is not yet approved or cleared by the [place] FDA and has been authorized for detection and/or diagnosis of SARS-CoV-2 by FDA under an Emergency Use Authorization (EUA). This EUA will remain in effect (meaning this test can be used) for the duration of the COVID-19 declaration under Section 564(b)(1) of the Act,  21 U.S.C. section 360bbb-3(b)(1), unless the authorization is terminated or revoked.  Performed at [facility], [place] [address] [place], [place] [address]   Respiratory (~20 pathogens) panel by PCR     Status: Abnormal   Collection Time: 01/23/22  5:30 PM   Specimen: Anterior Nasal Swab; Respiratory  Result Value Ref Range Status   Adenovirus NOT DETECTED NOT DETECTED Final   Coronavirus 229E NOT DETECTED NOT DETECTED Final    Comment: (NOTE) The Coronavirus on the Respiratory Panel, DOES NOT test for the novel  Coronavirus (2019 nCoV)    Coronavirus HKU1 NOT DETECTED NOT DETECTED Final   Coronavirus NL63 NOT DETECTED NOT DETECTED Final   Coronavirus OC43 NOT DETECTED NOT DETECTED Final   Metapneumovirus NOT DETECTED NOT DETECTED Final   Rhinovirus / Enterovirus DETECTED (A) NOT DETECTED Final   Influenza A NOT DETECTED NOT DETECTED Final   Influenza B NOT DETECTED NOT DETECTED Final   Parainfluenza Virus 1 NOT DETECTED NOT DETECTED Final   Parainfluenza Virus 2 NOT DETECTED NOT DETECTED Final   Parainfluenza Virus 3 NOT DETECTED NOT DETECTED Final   Parainfluenza Virus 4 NOT DETECTED NOT DETECTED Final   Respiratory Syncytial Virus NOT DETECTED NOT DETECTED Final   Bordetella pertussis NOT DETECTED NOT DETECTED Final   Bordetella Parapertussis NOT DETECTED NOT DETECTED Final   Chlamydophila pneumoniae NOT DETECTED NOT DETECTED Final   Mycoplasma pneumoniae NOT DETECTED NOT DETECTED Final    Comment: Performed at [facility], [place]. [address], [place], [place] [address]          Radiology Studies: DG Chest Port 1 View  Result Date: 01/23/2022 CLINICAL DATA:  Shortness of breath EXAM: PORTABLE CHEST 1 VIEW COMPARISON:  03/08/2021 FINDINGS: Stable cardiomediastinal contours. Aortic atherosclerosis. Streaky left lower lobe airspace opacity. No large pleural fluid collection. No pneumothorax. Bones are demineralized. Healed proximal right humeral  deformity. IMPRESSION: Streaky left lower lobe airspace opacity, atelectasis versus pneumonia. Electronically Signed   By: Davina Poke D.O.   On: 01/23/2022 15:53   DG Foot Complete Left  Result Date: 01/23/2022 CLINICAL DATA:  Fall.  Heel and lateral foot pain EXAM: LEFT FOOT - COMPLETE 3+ VIEW COMPARISON:  None Available. FINDINGS: There is a fracture noted through the calcaneus on the lateral view. Lucency also noted at the base of the left 5th metatarsal concerning for fracture. No subluxation or dislocation. Soft tissues are intact. IMPRESSION: Left calcaneal fracture. Fracture at the base of the left 5th metatarsal. Electronically Signed   By: Rolm Baptise M.D.   On: 01/23/2022 19:59   VAS [place] LOWER EXTREMITY VENOUS (DVT)  Result Date: 01/24/2022  Lower Venous DVT Study Patient Name:  SHAMYRA FARIAS  Date of Exam:   01/24/2022 Medical Rec #: [id number]      Accession #:    [id number] Date of Birth: 11-Mar-1932      Patient Gender: F Patient Age:   10 years Exam [facility] Procedure:  VAS [place] LOWER EXTREMITY VENOUS (DVT) Referring Phys: Aline August --------------------------------------------------------------------------------  Indications: Swelling.  Risk Factors: None identified. Comparison Study: No prior studies. Performing Technologist: Oliver Hum RVT  Examination Guidelines: A complete evaluation includes B-mode imaging, spectral Doppler, color Doppler, and power Doppler as needed of all accessible portions of each vessel. Bilateral testing is considered an integral part of a complete examination. Limited examinations for reoccurring indications may be performed as noted. The reflux portion of the exam is performed with the patient in reverse Trendelenburg.  +---------+---------------+---------+-----------+----------+--------------+ RIGHT    CompressibilityPhasicitySpontaneityPropertiesThrombus Aging  +---------+---------------+---------+-----------+----------+--------------+ CFV      Full           Yes      Yes                                 +---------+---------------+---------+-----------+----------+--------------+ SFJ      Full                                                        +---------+---------------+---------+-----------+----------+--------------+ FV Prox  Full                                                        +---------+---------------+---------+-----------+----------+--------------+ FV Mid   Full                                                        +---------+---------------+---------+-----------+----------+--------------+ FV DistalFull           Yes      Yes                                 +---------+---------------+---------+-----------+----------+--------------+ PFV      Full                                                        +---------+---------------+---------+-----------+----------+--------------+ POP      Full           Yes      Yes                                 +---------+---------------+---------+-----------+----------+--------------+ PTV      Full                                                        +---------+---------------+---------+-----------+----------+--------------+ PERO     Full                                                        +---------+---------------+---------+-----------+----------+--------------+   +----+---------------+---------+-----------+----------+--------------+  LEFTCompressibilityPhasicitySpontaneityPropertiesThrombus Aging +----+---------------+---------+-----------+----------+--------------+ CFV Full           Yes      Yes                                 +----+---------------+---------+-----------+----------+--------------+     Summary: RIGHT: - There is no evidence of deep vein thrombosis in the lower extremity.  - No cystic structure found in the popliteal fossa.   LEFT: - No evidence of common femoral vein obstruction.  *See table(s) above for measurements and observations. Electronically signed by Deitra Mayo MD on 01/24/2022 at 12:46:51 PM.    Final         Scheduled Meds:  aspirin EC  81 mg Oral Daily   benzonatate  100 mg Oral TID   citalopram  10 mg Oral Daily   donepezil  10 mg Oral Daily   levothyroxine  88 mcg Oral Q0600   methylPREDNISolone (SOLU-MEDROL) injection  40 mg Intravenous Daily   metoprolol succinate  25 mg Oral Daily   rosuvastatin  40 mg Oral QHS   Continuous Infusions:          Aline August, MD [facility] 01/25/2022, 9:14 AM

## 2022-01-25 NOTE — NC FL2 (Signed)
[place] LEVEL OF CARE SCREENING TOOL     IDENTIFICATION  Patient Name: Jasmin Crane Birthdate: [birth year] Sex: female Admission Date (Current Location): 01/23/2022  [facility] and [place] Number:  Herbalist and Address:  [facility],  [facility], [place]      Provider Number: [id number]  Attending Physician Name and Address:  Aline August, MD  Relative Name and Phone Number:  Jene Every) Adams: Hospital Recommended Level of Care: [place] Prior Approval Number:    Date Approved/Denied:   PASRR Number: [id number]  Discharge Plan: SNF    Current Diagnoses: Patient Active Problem List   Diagnosis Date Noted   Pneumonia 01/23/2022   Fall at home, initial encounter 01/23/2022   Left foot pain 01/23/2022   Chronic constipation 01/23/2022   Chronic diastolic CHF (congestive heart failure) ([place]) 01/23/2022   Hypothyroidism 01/23/2022   Alzheimer disease ([place]) 01/23/2022   Emphysema lung ([place]) 03/09/2021   Pleural effusion 03/09/2021   Cardiomegaly 03/09/2021   Acute respiratory failure with hypoxia ([place]) 03/08/2021   Educated about COVID-19 virus infection 05/30/2020   Dyslipidemia 05/28/2019   Medication management 06/10/2018   Other fatigue 06/10/2018   PVD (peripheral vascular disease) ([place]) 06/10/2018   Right lower quadrant abdominal pain 06/10/2018   AAA (abdominal aortic aneurysm) without rupture ([place]) 06/10/2018   Aortic valve regurgitation 06/10/2018   Carotid artery disease ([place]) 03/06/2017   Claudication in peripheral vascular disease ([place]) 03/06/2017   Essential hypertension 03/06/2017   Hyperlipidemia 03/06/2017   Coronary artery disease due to lipid rich plaque 03/06/2017    Orientation RESPIRATION BLADDER Height & Weight     Self, Time, Situation  Normal Continent Weight: 49.5 kg Height:  '5\' 2"'$  (157.5 cm)  BEHAVIORAL SYMPTOMS/MOOD  NEUROLOGICAL BOWEL NUTRITION STATUS      Continent Diet (Heart healthy)  AMBULATORY STATUS COMMUNICATION OF NEEDS Skin   Limited Assist Verbally Normal                       Personal Care Assistance Level of Assistance  Bathing, Feeding, Dressing Bathing Assistance: Limited assistance Feeding assistance: Limited assistance Dressing Assistance: Limited assistance     Functional Limitations Info  Sight, Hearing, Speech Sight Info: Adequate Hearing Info: Adequate Speech Info: Adequate    SPECIAL CARE FACTORS FREQUENCY  PT (By licensed PT), OT (By licensed OT)     PT Frequency:  (5x week) OT Frequency:  (5x week)            Contractures Contractures Info: Not present    Additional Factors Info  Code Status, Allergies Code Status Info:  (Full) Allergies Info:  (Shellfish, Cozaar,Pletaal)           Current Medications (01/25/2022):  This is the current hospital active medication list Current Facility-Administered Medications  Medication Dose Route Frequency Provider Last Rate Last Admin   acetaminophen (TYLENOL) tablet 650 mg  650 mg Oral Q6H PRN Marylyn Ishihara, Tyrone A, DO   650 mg at 01/25/22 0802   Or   acetaminophen (TYLENOL) suppository 650 mg  650 mg Rectal Q6H PRN Marylyn Ishihara, Tyrone A, DO       albuterol (PROVENTIL) (2.5 MG/3ML) 0.083% nebulizer solution 2.5 mg  2.5 mg Nebulization Q6H PRN Marylyn Ishihara, Tyrone A, DO       aspirin EC tablet 81 mg  81 mg Oral Daily Kyle, Tyrone A, DO   81 mg  at 01/24/22 1210   benzonatate (TESSALON) capsule 100 mg  100 mg Oral TID Aline August, MD   100 mg at 01/24/22 2200   citalopram (CELEXA) tablet 10 mg  10 mg Oral Daily Marylyn Ishihara, Tyrone A, DO   10 mg at 01/24/22 1210   donepezil (ARICEPT) tablet 10 mg  10 mg Oral Daily Kyle, Tyrone A, DO   10 mg at 01/24/22 1210   enoxaparin (LOVENOX) injection 40 mg  40 mg Subcutaneous Q24H Alekh, Kshitiz, MD       guaiFENesin-dextromethorphan (ROBITUSSIN DM) 100-10 MG/5ML syrup 10 mL  10 mL Oral Q4H PRN Aline August, MD   10 mL at 01/25/22 0514   levothyroxine (SYNTHROID) tablet 88 mcg  88 mcg Oral Q0600 Marylyn Ishihara, Tyrone A, DO   88 mcg at 01/25/22 0514   methylPREDNISolone sodium succinate (SOLU-MEDROL) 40 mg/mL injection 40 mg  40 mg Intravenous Daily Aline August, MD   40 mg at 01/24/22 1214   metoprolol succinate (TOPROL-XL) 24 hr tablet 25 mg  25 mg Oral Daily Kyle, Tyrone A, DO   25 mg at 01/24/22 1211   rosuvastatin (CRESTOR) tablet 40 mg  40 mg Oral QHS Kyle, Tyrone A, DO   40 mg at 01/24/22 2100   senna-docusate (Senokot-S) tablet 1 tablet  1 tablet Oral QHS PRN Cherylann Ratel A, DO       traMADol (ULTRAM) tablet 50 mg  50 mg Oral Q6H PRN Aline August, MD   50 mg at 01/24/22 2032     Discharge Medications: Please see discharge summary for a list of discharge medications.  Relevant Imaging Results:  Relevant Lab Results:   Additional Information [address] [place], [place]

## 2022-01-26 DIAGNOSIS — J189 Pneumonia, unspecified organism: Secondary | ICD-10-CM | POA: Diagnosis not present

## 2022-01-26 DIAGNOSIS — I10 Essential (primary) hypertension: Secondary | ICD-10-CM | POA: Diagnosis not present

## 2022-01-26 DIAGNOSIS — I5032 Chronic diastolic (congestive) heart failure: Secondary | ICD-10-CM | POA: Diagnosis not present

## 2022-01-26 DIAGNOSIS — G309 Alzheimer's disease, unspecified: Secondary | ICD-10-CM | POA: Diagnosis not present

## 2022-01-26 LAB — CBC WITH DIFFERENTIAL/PLATELET
Abs Immature Granulocytes: 0.1 10*3/uL — ABNORMAL HIGH (ref 0.00–0.07)
Basophils Absolute: 0 10*3/uL (ref 0.0–0.1)
Basophils Relative: 0 %
Eosinophils Absolute: 0 10*3/uL (ref 0.0–0.5)
Eosinophils Relative: 0 %
HCT: 30.7 % — ABNORMAL LOW (ref 36.0–46.0)
Hemoglobin: 9.3 g/dL — ABNORMAL LOW (ref 12.0–15.0)
Immature Granulocytes: 1 %
Lymphocytes Relative: 16 %
Lymphs Abs: 1.3 10*3/uL (ref 0.7–4.0)
MCH: 27.3 pg (ref 26.0–34.0)
MCHC: 30.3 g/dL (ref 30.0–36.0)
MCV: 90 fL (ref 80.0–100.0)
Monocytes Absolute: 0.4 10*3/uL (ref 0.1–1.0)
Monocytes Relative: 6 %
Neutro Abs: 6 10*3/uL (ref 1.7–7.7)
Neutrophils Relative %: 77 %
Platelets: 319 10*3/uL (ref 150–400)
RBC: 3.41 MIL/uL — ABNORMAL LOW (ref 3.87–5.11)
RDW: 16.6 % — ABNORMAL HIGH (ref 11.5–15.5)
WBC: 7.8 10*3/uL (ref 4.0–10.5)
nRBC: 0 % (ref 0.0–0.2)

## 2022-01-26 LAB — BASIC METABOLIC PANEL
Anion gap: 7 (ref 5–15)
BUN: 18 mg/dL (ref 8–23)
CO2: 27 mmol/L (ref 22–32)
Calcium: 8.5 mg/dL — ABNORMAL LOW (ref 8.9–10.3)
Chloride: 104 mmol/L (ref 98–111)
Creatinine, Ser: 0.74 mg/dL (ref 0.44–1.00)
GFR, Estimated: >60 mL/min (ref >60)
Glucose, Bld: 91 mg/dL (ref 70–99)
Potassium: 3.9 mmol/L (ref 3.5–5.1)
Sodium: 138 mmol/L (ref 135–145)

## 2022-01-26 LAB — MAGNESIUM: Magnesium: 2.2 mg/dL (ref 1.7–2.4)

## 2022-01-26 NOTE — TOC Progression Note (Addendum)
Transition of Care [facility]) - Progression Note    Patient Details  Name: Jasmin Crane MRN: [id number] Date of Birth: March 19, 1932  Transition of Care [facility]) CM/SW Contact  Kiylee Thoreson, Juliann Pulse, RN Phone Number: 01/26/2022, 10:17 AM  Clinical Narrative:   Bed offers given-await choice. -3p-dtr chose [facility]en Hipp notified by secy since vm full for d/c tomorrow. 1. 1.2 mi [facility] [address] [place], [place] [address] [phone] Overall rating Below average 2. 1.4 mi [place] at the [place] [address] [place], [place] [address] [phone] Overall rating Above average 3. [place], [place] [address] [phone] Overall rating Much above average 4. 2.3 mi [place], [place] [address] [phone] Overall rating Much below average 5. 2.7 [place] [address] [place], [place] [address] [phone] Overall rating Much below average 6. 3.1 mi [place] A [facility] and [place], [place] [address] [phone] Overall rating Average 7. 3.1 mi [facility] for Nursing and [place], [place] [address] [phone] Overall rating Much below average 8. 3.7 mi [place] [address] [place], [place] [address] [phone] Overall rating Much below average 9. 3.8 mi [facility] [place], [place] [address] [phone] Overall rating Much below average 10. 5.4 [place], [place] [address] [phone] Overall rating Average 11. 5.7 mi [facility] at [place], [place] [address] [phone] Overall rating Much above average 12. 7.2 mi [place], [place] [address] [phone] Overall rating Above average 13. 7.3 mi [facility] and [place], [place] [address] [phone] Overall rating Below average 14. 8.2 [place], [place] [address] [phone] Overall rating Much above average 15. 10.6 mi The [place] [address] [place], [place] [address] [phone] Overall rating Above average 16. 10.9 mi [facility] [address] [place], [place] [address] [phone] Overall rating Much above average 17. 12.5 mi [place] at [facility] [address] [place], [place] [address] [phone] Overall rating Much above average 18. 14.1 mi [facility] [address] [place], [place] [address] [phone] Overall rating Much below average 19. 14.5 [facility] [address] [place], [place] [address] [phone] Overall rating Much below average 20. 15 mi [facility] [place], [place] [address] [phone] Overall rating Average 21. 15.6 mi The [place] [address] [place], [place] [address] [phone] Overall rating Below average 22. 16.1 mi [place] at [place], [place] [address] [phone] Overall rating Above average 23. 16.2 mi [place], [place] [address] [phone] Overall rating Much above average 24. 16.2 mi [place], [place] [address] [phone] Overall rating Below average 25. 16.3 [place] [address] [place], [place] [address] [phone] Overall rating Much below average 26. 16.9 mi [place] [address]  [place], [place] [address] [phone] Overall rating Above average 27. 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 19.7 mi [facility] for Nursing and Rehab [address] [place], [place] [address] [phone] Overall rating Much below average 29. 20.4 [place] for [facility] and Rehabilitation [address] [place], [place] [address] [phone] Overall rating Below average 30. 20.6 [place], [place] [address] [phone] Overall rating Much above average 31. 20.9 mi [facility] [address] [place], [place] [address] [phone] Overall rating Below average 32. 20.9 [place], [place] [address] [phone] Overall rating Much below average 33. 21.4 mi [facility] [address] [place], [place] [address] [phone] Overall rating Much below average 34. 21.7 mi [facility] [address] [place], [place] [address] [phone] Overall rating Above average 35. 21.8 [place], [place] [address] [phone] Overall rating Not available18 36. 23.2 mi [address] [place], [place] [address] [phone] Overall rating Below average 37. 23.3 mi [facility] [place], [place] [address] [phone] Overall rating Much below average 38. 23.6 [address] [place], [place] [address] [phone] Overall rating Much above average 39. 24.3 [facility] [address] [place], [place]  [address] [phone] Overall rating Much below average 40. 24.7 mi [place], [place] [address] [phone] Overall rating Average To explore and download nursing home data,visit the data    Expected Discharge Plan: [place] Barriers to Discharge: Continued Medical Work up  Expected Discharge Plan and Services Expected Discharge Plan: [place]   Discharge Planning Services: CM Consult Post Acute Care Choice: [place] arrangements for the past 2 months: Single Family Home                                       Social Determinants of Health (SDOH) Interventions    Readmission Risk Interventions     View : No data to display.

## 2022-01-26 NOTE — Progress Notes (Signed)
PROGRESS NOTE    Khylei Wilms  WEX:[id number] DOB: [birth year] DOA: 01/23/2022 PCP: Hayden Rasmussen, MD   Brief Narrative:  88 y.o. female with medical history significant of chronic HFpEF, HLD, CAD, Alzheimer's disease, recent diagnosis of left foot fracture presented with worsening shortness of breath despite being on oral antibiotics/steroids as an outpatient.  On presentation, procalcitonin was less than 0.1; chest x-ray showed streaky left lower lobe airspace opacity, atelectasis versus pneumonia.  She was started on IV antibiotics.  Subsequently, respiratory virus panel was positive for rhinovirus.  Antibiotics were discontinued and she was started on IV steroids.  PT recommended SNF placement.  Assessment & Plan:   Possible left lower lobe viral pneumonia/viral bronchitis Hypoxia -Presented with worsening shortness of breath and cough.  Chest x-ray as above.  Respiratory virus panel positive for rhinovirus. Off antibiotics.  Continue IV steroids and switch to oral prednisone on discharge. -Currently requiring 1-2 L oxygen via nasal cannula.  Wean off as able.  Recent fall Left calcaneal fracture/left fifth metatarsal fracture -Patient had a recent diagnosis of left foot fracture and was discharged from the ED with a boot with outpatient follow-up with orthopedics. -Stressed importance of outpatient follow-up with orthopedics.  Patient will need to follow-up with [facility] as an outpatient: Communicated with Maurice Small for [facility] via secure chat on 01/24/2022 who recommended the same.  Continue partial weightbearing as tolerated -PT recommended SNF placement.  TOC following.  Currently medically stable for discharge.  Chronic diastolic CHF -Currently compensated.  Strict input and output.  Daily weights.  Continue metoprolol succinate  CAD Hyperlipidemia -Continue aspirin, metoprolol, statin.  Stable.  Outpatient follow-up with cardiology  Alzheimer's  disease -Stable.  Fall precautions.  Continue donepezil and citalopram  Hypothyroidism--continue levothyroxine  Physical deconditioning-PT recommendations as above   DVT prophylaxis: Lovenox Code Status: Full Family Communication: Daughter at bedside on 01/25/2022 Disposition Plan: Status is: inpatient because: Of severity of illness.  Need for IV steroids.  Need for SNF placement Currently medically stable for discharge  Consultants: None  Procedures: None  Antimicrobials: Rocephin and Zithromax on 01/23/2022   Subjective: Patient seen and examined at bedside.  Poor historian.  Complains of weakness and cough.  No overnight fever, vomiting or seizures reported.   Objective: Vitals:   01/25/22 0523 01/25/22 1220 01/25/22 2017 01/26/22 0436  BP:  (!) 110/43 (!) 109/97 (!) 131/57  Pulse:  (!) 54 61 (!) 58  Resp:  '20 18 20  '$ Temp:  (!) 97.5 F (36.4 C) 98.8 F (37.1 C) 98 F (36.7 C)  TempSrc:  Oral Oral Oral  SpO2: 93% 97% 96% 94%  Weight:      Height:        Intake/Output Summary (Last 24 hours) at 01/26/2022 0947 Last data filed at 01/25/2022 1357 Gross per 24 hour  Intake 700 ml  Output --  Net 700 ml    Filed Weights   01/23/22 1319 01/24/22 0500 01/25/22 0500  Weight: 46.3 kg 46.6 kg 49.5 kg    Examination:  General: No acute distress.  Still on 1 to 2 L oxygen via nasal cannula.  Elderly female lying in bed. ENT/neck: No palpable thyromegaly or JVD elevation noted respiratory: Bilateral decreased breath sounds at bases with scattered crackles CVS: Mildly Bradick intermittently; S1 and S2 are heard Abdominal: Soft, nontender, mildly distended; no organomegaly, bowel sounds are heard Extremities: No clubbing; mild lower extremity edema present  CNS: Awake, poor historian, still slow to respond.  No focal  neurologic deficit.  Moving extremities Lymph: No obvious cervical lymphadenopathy present  skin: No obvious petechiae/ecchymosis  psych: Currently does  not show signs of agitation.  Flat affect.  Intermittently gets angry/upset  musculoskeletal: No obvious joint swelling/deformity     Data Reviewed: I have personally reviewed following labs and imaging studies  CBC: Recent Labs  Lab 01/23/22 1540 01/24/22 0724 01/26/22 0524  WBC 10.9* 8.6 7.8  NEUTROABS 9.7*  --  6.0  HGB 10.5* 9.7* 9.3*  HCT 34.3* 31.6* 30.7*  MCV 90.7 90.5 90.0  PLT 315 295 196    Basic Metabolic Panel: Recent Labs  Lab 01/23/22 1540 01/24/22 0724 01/26/22 0524  NA 136 138 138  K 3.9 3.5 3.9  CL 104 103 104  CO2 '23 26 27  '$ GLUCOSE 126* 85 91  BUN '19 20 18  '$ CREATININE 0.99 0.85 0.74  CALCIUM 8.6* 8.3* 8.5*  MG  --   --  2.2    GFR: Estimated Creatinine Clearance: 38 mL/min (by C-G formula based on SCr of 0.74 mg/dL). Liver Function Tests: Recent Labs  Lab 01/24/22 0724  AST 22  ALT 15  ALKPHOS 71  BILITOT 0.7  PROT 5.7*  ALBUMIN 2.6*    No results for input(s): LIPASE, AMYLASE in the last 168 hours. No results for input(s): AMMONIA in the last 168 hours. Coagulation Profile: No results for input(s): INR, PROTIME in the last 168 hours. Cardiac Enzymes: No results for input(s): CKTOTAL, CKMB, CKMBINDEX, TROPONINI in the last 168 hours. BNP (last 3 results) No results for input(s): PROBNP in the last 8760 hours. HbA1C: No results for input(s): HGBA1C in the last 72 hours. CBG: No results for input(s): GLUCAP in the last 168 hours. Lipid Profile: No results for input(s): CHOL, HDL, LDLCALC, TRIG, CHOLHDL, LDLDIRECT in the last 72 hours. Thyroid Function Tests: Recent Labs    01/23/22 1540  TSH 2.546    Anemia Panel: No results for input(s): VITAMINB12, FOLATE, FERRITIN, TIBC, IRON, RETICCTPCT in the last 72 hours. Sepsis Labs: Recent Labs  Lab 01/23/22 1540 01/24/22 0724  PROCALCITON <0.10 <0.10     Recent Results (from the past 240 hour(s))  Resp Panel by RT-PCR (Flu A&B, Covid) Anterior Nasal Swab     Status: None    Collection Time: 01/23/22  5:30 PM   Specimen: Anterior Nasal Swab  Result Value Ref Range Status   SARS Coronavirus 2 by RT PCR NEGATIVE NEGATIVE Final    Comment: (NOTE) SARS-CoV-2 target nucleic acids are NOT DETECTED.  The SARS-CoV-2 RNA is generally detectable in upper respiratory specimens during the acute phase of infection. The lowest concentration of SARS-CoV-2 viral copies this assay can detect is 138 copies/mL. A negative result does not preclude SARS-Cov-2 infection and should not be used as the sole basis for treatment or other patient management decisions. A negative result may occur with  improper specimen collection/handling, submission of specimen other than nasopharyngeal swab, presence of viral mutation(s) within the areas targeted by this assay, and inadequate number of viral copies(<138 copies/mL). A negative result must be combined with clinical observations, patient history, and epidemiological information. The expected result is Negative.  Fact Sheet for Patients:  EntrepreneurPulse.com.au  Fact Sheet for Healthcare Providers:  IncredibleEmployment.be  This test is no t yet approved or cleared by the [place] FDA and  has been authorized for detection and/or diagnosis of SARS-CoV-2 by FDA under an Emergency Use Authorization (EUA). This EUA will remain  in effect (meaning this test  can be used) for the duration of the COVID-19 declaration under Section 564(b)(1) of the Act, 21 U.S.C.section 360bbb-3(b)(1), unless the authorization is terminated  or revoked sooner.       Influenza A by PCR NEGATIVE NEGATIVE Final   Influenza B by PCR NEGATIVE NEGATIVE Final    Comment: (NOTE) The Xpert Xpress SARS-CoV-2/FLU/RSV plus assay is intended as an aid in the diagnosis of influenza from Nasopharyngeal swab specimens and should not be used as a sole basis for treatment. Nasal washings and aspirates are unacceptable for  Xpert Xpress SARS-CoV-2/FLU/RSV testing.  Fact Sheet for Patients: EntrepreneurPulse.com.au  Fact Sheet for Healthcare Providers: IncredibleEmployment.be  This test is not yet approved or cleared by the [place] FDA and has been authorized for detection and/or diagnosis of SARS-CoV-2 by FDA under an Emergency Use Authorization (EUA). This EUA will remain in effect (meaning this test can be used) for the duration of the COVID-19 declaration under Section 564(b)(1) of the Act, 21 U.S.C. section 360bbb-3(b)(1), unless the authorization is terminated or revoked.  Performed at [facility], [place] [address] [place], [place] [address]   Respiratory (~20 pathogens) panel by PCR     Status: Abnormal   Collection Time: 01/23/22  5:30 PM   Specimen: Anterior Nasal Swab; Respiratory  Result Value Ref Range Status   Adenovirus NOT DETECTED NOT DETECTED Final   Coronavirus 229E NOT DETECTED NOT DETECTED Final    Comment: (NOTE) The Coronavirus on the Respiratory Panel, DOES NOT test for the novel  Coronavirus (2019 nCoV)    Coronavirus HKU1 NOT DETECTED NOT DETECTED Final   Coronavirus NL63 NOT DETECTED NOT DETECTED Final   Coronavirus OC43 NOT DETECTED NOT DETECTED Final   Metapneumovirus NOT DETECTED NOT DETECTED Final   Rhinovirus / Enterovirus DETECTED (A) NOT DETECTED Final   Influenza A NOT DETECTED NOT DETECTED Final   Influenza B NOT DETECTED NOT DETECTED Final   Parainfluenza Virus 1 NOT DETECTED NOT DETECTED Final   Parainfluenza Virus 2 NOT DETECTED NOT DETECTED Final   Parainfluenza Virus 3 NOT DETECTED NOT DETECTED Final   Parainfluenza Virus 4 NOT DETECTED NOT DETECTED Final   Respiratory Syncytial Virus NOT DETECTED NOT DETECTED Final   Bordetella pertussis NOT DETECTED NOT DETECTED Final   Bordetella Parapertussis NOT DETECTED NOT DETECTED Final   Chlamydophila pneumoniae NOT DETECTED NOT DETECTED Final    Mycoplasma pneumoniae NOT DETECTED NOT DETECTED Final    Comment: Performed at [facility], [place]. [address], [place], [place] [address]          Radiology Studies: VAS [place] LOWER EXTREMITY VENOUS (DVT)  Result Date: 01/24/2022  Lower Venous DVT Study Patient Name:  NIKA YAZZIE  Date of Exam:   01/24/2022 Medical Rec #: [id number]      Accession #:    [id number] Date of Birth: 1932/06/09      Patient Gender: F Patient Age:   6 years Exam Location:  [facility] Procedure:      VAS [place] LOWER EXTREMITY VENOUS (DVT) Referring Phys: Aline August --------------------------------------------------------------------------------  Indications: Swelling.  Risk Factors: None identified. Comparison Study: No prior studies. Performing Technologist: Oliver Hum RVT  Examination Guidelines: A complete evaluation includes B-mode imaging, spectral Doppler, color Doppler, and power Doppler as needed of all accessible portions of each vessel. Bilateral testing is considered an integral part of a complete examination. Limited examinations for reoccurring indications may be performed as noted. The reflux portion of the exam is performed  with the patient in reverse Trendelenburg.  +---------+---------------+---------+-----------+----------+--------------+ RIGHT    CompressibilityPhasicitySpontaneityPropertiesThrombus Aging +---------+---------------+---------+-----------+----------+--------------+ CFV      Full           Yes      Yes                                 +---------+---------------+---------+-----------+----------+--------------+ SFJ      Full                                                        +---------+---------------+---------+-----------+----------+--------------+ FV Prox  Full                                                        +---------+---------------+---------+-----------+----------+--------------+ FV Mid   Full                                                         +---------+---------------+---------+-----------+----------+--------------+ FV DistalFull           Yes      Yes                                 +---------+---------------+---------+-----------+----------+--------------+ PFV      Full                                                        +---------+---------------+---------+-----------+----------+--------------+ POP      Full           Yes      Yes                                 +---------+---------------+---------+-----------+----------+--------------+ PTV      Full                                                        +---------+---------------+---------+-----------+----------+--------------+ PERO     Full                                                        +---------+---------------+---------+-----------+----------+--------------+   +----+---------------+---------+-----------+----------+--------------+ LEFTCompressibilityPhasicitySpontaneityPropertiesThrombus Aging +----+---------------+---------+-----------+----------+--------------+ CFV Full           Yes      Yes                                 +----+---------------+---------+-----------+----------+--------------+  Summary: RIGHT: - There is no evidence of deep vein thrombosis in the lower extremity.  - No cystic structure found in the popliteal fossa.  LEFT: - No evidence of common femoral vein obstruction.  *See table(s) above for measurements and observations. Electronically signed by Deitra Mayo MD on 01/24/2022 at 12:46:51 PM.    Final         Scheduled Meds:  aspirin EC  81 mg Oral Daily   benzonatate  100 mg Oral TID   citalopram  10 mg Oral Daily   donepezil  10 mg Oral Daily   enoxaparin (LOVENOX) injection  40 mg Subcutaneous Q24H   levothyroxine  88 mcg Oral Q0600   methylPREDNISolone (SOLU-MEDROL) injection  40 mg Intravenous Daily   metoprolol succinate  25 mg Oral Daily   rosuvastatin  40 mg Oral QHS    Continuous Infusions:          Aline August, MD [facility] 01/26/2022, 9:47 AM

## 2022-01-27 ENCOUNTER — Other Ambulatory Visit: Payer: Self-pay

## 2022-01-27 ENCOUNTER — Encounter (HOSPITAL_COMMUNITY): Payer: Self-pay | Admitting: Internal Medicine

## 2022-01-27 DIAGNOSIS — J189 Pneumonia, unspecified organism: Secondary | ICD-10-CM | POA: Diagnosis not present

## 2022-01-27 DIAGNOSIS — G309 Alzheimer's disease, unspecified: Secondary | ICD-10-CM | POA: Diagnosis not present

## 2022-01-27 DIAGNOSIS — I5032 Chronic diastolic (congestive) heart failure: Secondary | ICD-10-CM | POA: Diagnosis not present

## 2022-01-27 DIAGNOSIS — I10 Essential (primary) hypertension: Secondary | ICD-10-CM | POA: Diagnosis not present

## 2022-01-27 MED ORDER — ALBUTEROL SULFATE HFA 108 (90 BASE) MCG/ACT IN AERS: 2.0000 | INHALATION_SPRAY | RESPIRATORY_TRACT | 0 refills | Status: DC | PRN

## 2022-01-27 MED ORDER — GUAIFENESIN-DM 100-10 MG/5ML PO SYRP
10.0000 mL | ORAL_SOLUTION | ORAL | 0 refills | Status: DC | PRN
Start: 2022-01-27 — End: 2022-03-14

## 2022-01-27 MED ORDER — PREDNISONE 20 MG PO TABS: 40.0000 mg | ORAL_TABLET | Freq: Every day | ORAL | 0 refills | Status: AC

## 2022-01-27 NOTE — TOC Transition Note (Addendum)
Transition of Care [facility]) - CM/SW Discharge Note   Patient Details  Name: Veronica Fretz MRN: [id number] Date of Birth: Jul 28, 1932  Transition of Care [facility]) CM/SW Contact:  Dessa Phi, RN Phone Number: 01/27/2022, 10:04 AM   Clinical Narrative:  d/c summary sent to [facility] aware of d/c. Await rm#,tel# report ,then call PTAR. -10:40a-PTAR scheduled for 11:30a pick up. Going to rm#[id number], report tel#[phone]. No further CM needs.    Final next level of care: Skilled Nursing Facility Barriers to Discharge: No Barriers Identified   Patient Goals and CMS Choice Patient states their goals for this hospitalization and ongoing recovery are:: Home CMS Medicare.gov Compare Post Acute Care list provided to:: Patient Represenative (must comment) [name] dtr)    Discharge Placement   Existing PASRR number confirmed : 01/24/22          Patient chooses bed at: [facility] Patient to be transferred to facility by: [place] Name of family member notified: [name] dtr Patient and family notified of of transfer: 01/27/22  Discharge Plan and Services   Discharge Planning Services: CM Consult Post Acute Care Choice: Home Health                               Social Determinants of Health (SDOH) Interventions     Readmission Risk Interventions     View : No data to display.

## 2022-01-27 NOTE — Progress Notes (Signed)
Report called to [facility] spoke with nurse  supervisor Otila Kluver who will be assuming care when pt arrived.

## 2022-01-27 NOTE — Plan of Care (Signed)
  Problem: Clinical Measurements: Goal: Ability to maintain a body temperature in the normal range will improve Outcome: Adequate for Discharge   Problem: Respiratory: Goal: Ability to maintain adequate ventilation will improve Outcome: Adequate for Discharge   Problem: Clinical Measurements: Goal: Respiratory complications will improve Outcome: Adequate for Discharge

## 2022-01-27 NOTE — Care Management Important Message (Signed)
Important Message  Patient Details IM Letter placed in Patients room. Name: Jasmin Crane MRN: [id number] Date of Birth: 1931/09/28   Medicare Important Message Given:  Yes     Kerin Salen 01/27/2022, 2:57 PM

## 2022-01-27 NOTE — Care Management Important Message (Deleted)
Important Message  Patient Details IM Letter given to the Patient. Name: Marigny Borre MRN: [id number] Date of Birth: May 03, 1932   Medicare Important Message Given:  Yes     Kerin Salen 01/27/2022, 2:56 PM

## 2022-01-27 NOTE — Discharge Summary (Signed)
Physician Discharge Summary  Megyn Leng YYT:[id number] DOB: Nov 19, 1931 DOA: 01/23/2022  PCP: Hayden Rasmussen, MD  Admit date: 01/23/2022 Discharge date: 01/27/2022  Admitted From: Home Disposition: SNF  Recommendations for Outpatient Follow-up:  Follow up with SNF provider at earliest convenience  Outpatient follow-up with [facility]  follow up in ED if symptoms worsen or new appear   Home Health: No Equipment/Devices: None  Discharge Condition: Stable CODE STATUS: Full Diet recommendation: Heart healthy  Brief/Interim Summary: 88 y.o. female with medical history significant of chronic HFpEF, HLD, CAD, Alzheimer's disease, recent diagnosis of left foot fracture presented with worsening shortness of breath despite being on oral antibiotics/steroids as an outpatient.  On presentation, procalcitonin was less than 0.1; chest x-ray showed streaky left lower lobe airspace opacity, atelectasis versus pneumonia.  She was started on IV antibiotics.  Subsequently, respiratory virus panel was positive for rhinovirus.  Antibiotics were discontinued and she was started on IV steroids.  Respiratory status is improving.  Intermittently, she is still requiring 1 to 2 L oxygen by nasal cannula.  PT recommended SNF placement.  She will be discharged to SNF once bed is available on oral prednisone.  Discharge Diagnoses:   Possible left lower lobe viral pneumonia/viral bronchitis Hypoxia -Presented with worsening shortness of breath and cough.  Chest x-ray as above.  Respiratory virus panel positive for rhinovirus. Off antibiotics.  Currently on IV Solu-Medrol. -Currently still intermittently requiring 1 L oxygen via nasal cannula.   -Discharge to SNF once bed is available prednisone 40 mg daily for 7 days.   Recent fall Left calcaneal fracture/left fifth metatarsal fracture -Patient had a recent diagnosis of left foot fracture and was discharged from the ED with a boot with outpatient follow-up with  orthopedics. -Stressed importance of outpatient follow-up with orthopedics.  Patient will need to follow-up with [facility] as an outpatient. Continue partial weightbearing as tolerated -PT recommended SNF placement.  TOC following.  Currently medically stable for discharge.   Chronic diastolic CHF -Currently compensated.  Continue fluid and diet restrictions.  Continue metoprolol succinate   CAD Hyperlipidemia -Continue aspirin, metoprolol, statin.  Stable.  Outpatient follow-up with cardiology  Alzheimer's disease -Stable.  Fall precautions.  Continue donepezil and citalopram  Hypothyroidism--continue levothyroxine   Physical deconditioning-PT recommendations as above  Discharge Instructions   Allergies as of 01/27/2022       Reactions   Shellfish Allergy Swelling, Anaphylaxis   Cozaar [losartan] Nausea Only   Pletaal [cilostazol] Palpitations        Medication List     STOP taking these medications    amLODipine 2.5 MG tablet Commonly known as: NORVASC   amoxicillin-clavulanate 875-125 MG tablet Commonly known as: AUGMENTIN   azithromycin 250 MG tablet Commonly known as: ZITHROMAX   HYDROcodone-acetaminophen 5-325 MG tablet Commonly known as: NORCO/VICODIN   methocarbamol 500 MG tablet Commonly known as: ROBAXIN       TAKE these medications    acetaminophen 325 MG tablet Commonly known as: TYLENOL Take 2 tablets (650 mg total) by mouth every 6 (six) hours as needed for mild pain, fever or headache.   albuterol 108 (90 Base) MCG/ACT inhaler Commonly known as: VENTOLIN HFA Inhale 2 puffs into the lungs every 4 (four) hours as needed for wheezing or shortness of breath.   aspirin EC 81 MG tablet Take 81 mg by mouth daily.   benzonatate 100 MG capsule Commonly known as: TESSALON Take 100 mg by mouth 3 (three) times daily as needed for cough.  citalopram 10 MG tablet Commonly known as: CELEXA Take 10 mg by mouth daily.   donepezil 10 MG  tablet Commonly known as: ARICEPT Take 10 mg by mouth in the morning.   guaiFENesin-dextromethorphan 100-10 MG/5ML syrup Commonly known as: ROBITUSSIN DM Take 10 mLs by mouth every 4 (four) hours as needed for cough.   ipratropium 0.06 % nasal spray Commonly known as: ATROVENT 2 sprays 2 (two) times daily as needed (allergies).   levothyroxine 88 MCG tablet Commonly known as: SYNTHROID Take 88 mcg by mouth every evening.   metoprolol succinate 25 MG 24 hr tablet Commonly known as: TOPROL-XL TAKE 1 TABLET BY MOUTH EVERY DAY   predniSONE 20 MG tablet Commonly known as: DELTASONE Take 2 tablets (40 mg total) by mouth daily with breakfast for 7 days. What changed:  medication strength how much to take when to take this   rosuvastatin 40 MG tablet Commonly known as: CRESTOR TAKE 1 TABLET BY MOUTH NIGHTLY AT BEDTIME        Contact information for after-discharge care     Destination     [facility]erred [facility] .   Service: Skilled Nursing Contact information: [address] [address] [phone]                    Allergies  Allergen Reactions   Shellfish Allergy Swelling and Anaphylaxis   Cozaar [Losartan] Nausea Only   Pletaal [Cilostazol] Palpitations    Consultations: Communicated with [facility] Maurice Small via Secure chat   Procedures/Studies: DG Chest Port 1 View  Result Date: 01/23/2022 CLINICAL DATA:  Shortness of breath EXAM: PORTABLE CHEST 1 VIEW COMPARISON:  03/08/2021 FINDINGS: Stable cardiomediastinal contours. Aortic atherosclerosis. Streaky left lower lobe airspace opacity. No large pleural fluid collection. No pneumothorax. Bones are demineralized. Healed proximal right humeral deformity. IMPRESSION: Streaky left lower lobe airspace opacity, atelectasis versus pneumonia. Electronically Signed   By: Davina Poke D.O.   On: 01/23/2022 15:53   DG Foot Complete Left  Result Date:  01/23/2022 CLINICAL DATA:  Fall.  Heel and lateral foot pain EXAM: LEFT FOOT - COMPLETE 3+ VIEW COMPARISON:  None Available. FINDINGS: There is a fracture noted through the calcaneus on the lateral view. Lucency also noted at the base of the left 5th metatarsal concerning for fracture. No subluxation or dislocation. Soft tissues are intact. IMPRESSION: Left calcaneal fracture. Fracture at the base of the left 5th metatarsal. Electronically Signed   By: Rolm Baptise M.D.   On: 01/23/2022 19:59   VAS [place] LOWER EXTREMITY VENOUS (DVT)  Result Date: 01/24/2022  Lower Venous DVT Study Patient Name:  AREONA HOMER  Date of Exam:   01/24/2022 Medical Rec #: [id number]      Accession #:    [id number] Date of Birth: 10-14-1931      Patient Gender: F Patient Age:   88 years Exam Location:  [facility] Procedure:      VAS [place] LOWER EXTREMITY VENOUS (DVT) Referring Phys: Aline August --------------------------------------------------------------------------------  Indications: Swelling.  Risk Factors: None identified. Comparison Study: No prior studies. Performing Technologist: Oliver Hum RVT  Examination Guidelines: A complete evaluation includes B-mode imaging, spectral Doppler, color Doppler, and power Doppler as needed of all accessible portions of each vessel. Bilateral testing is considered an integral part of a complete examination. Limited examinations for reoccurring indications may be performed as noted. The reflux portion of the exam is performed with the patient in reverse Trendelenburg.  +---------+---------------+---------+-----------+----------+--------------+  RIGHT    CompressibilityPhasicitySpontaneityPropertiesThrombus Aging +---------+---------------+---------+-----------+----------+--------------+ CFV      Full           Yes      Yes                                 +---------+---------------+---------+-----------+----------+--------------+ SFJ      Full                                                         +---------+---------------+---------+-----------+----------+--------------+ FV Prox  Full                                                        +---------+---------------+---------+-----------+----------+--------------+ FV Mid   Full                                                        +---------+---------------+---------+-----------+----------+--------------+ FV DistalFull           Yes      Yes                                 +---------+---------------+---------+-----------+----------+--------------+ PFV      Full                                                        +---------+---------------+---------+-----------+----------+--------------+ POP      Full           Yes      Yes                                 +---------+---------------+---------+-----------+----------+--------------+ PTV      Full                                                        +---------+---------------+---------+-----------+----------+--------------+ PERO     Full                                                        +---------+---------------+---------+-----------+----------+--------------+   +----+---------------+---------+-----------+----------+--------------+ LEFTCompressibilityPhasicitySpontaneityPropertiesThrombus Aging +----+---------------+---------+-----------+----------+--------------+ CFV Full           Yes      Yes                                 +----+---------------+---------+-----------+----------+--------------+  Summary: RIGHT: - There is no evidence of deep vein thrombosis in the lower extremity.  - No cystic structure found in the popliteal fossa.  LEFT: - No evidence of common femoral vein obstruction.  *See table(s) above for measurements and observations. Electronically signed by Deitra Mayo MD on 01/24/2022 at 12:46:51 PM.    Final       Subjective: Patient seen and examined at bedside.  Poor  historian.  No overnight seizures, agitation, vomiting reported.  Feels weak has intermittent cough.  Discharge Exam: Vitals:   01/26/22 2047 01/27/22 0609  BP: (!) 106/54 (!) 156/55  Pulse: 67 (!) 56  Resp: 18 18  Temp: 98.2 F (36.8 C) 98 F (36.7 C)  SpO2: 92% 94%    General: Pt is alert, awake, not in acute distress.  Chronically deconditioned and ill looking.  Intermittently currently requiring 1 L oxygen via nasal cannula Cardiovascular: Mild bradycardia intermittently, S1/S2 + Respiratory: bilateral decreased breath sounds at bases with some scattered crackles Abdominal: Soft, NT, ND, bowel sounds + Extremities: Trace lower extremity edema, no cyanosis    The results of significant diagnostics from this hospitalization (including imaging, microbiology, ancillary and laboratory) are listed below for reference.     Microbiology: Recent Results (from the past 240 hour(s))  Resp Panel by RT-PCR (Flu A&B, Covid) Anterior Nasal Swab     Status: None   Collection Time: 01/23/22  5:30 PM   Specimen: Anterior Nasal Swab  Result Value Ref Range Status   SARS Coronavirus 2 by RT PCR NEGATIVE NEGATIVE Final    Comment: (NOTE) SARS-CoV-2 target nucleic acids are NOT DETECTED.  The SARS-CoV-2 RNA is generally detectable in upper respiratory specimens during the acute phase of infection. The lowest concentration of SARS-CoV-2 viral copies this assay can detect is 138 copies/mL. A negative result does not preclude SARS-Cov-2 infection and should not be used as the sole basis for treatment or other patient management decisions. A negative result may occur with  improper specimen collection/handling, submission of specimen other than nasopharyngeal swab, presence of viral mutation(s) within the areas targeted by this assay, and inadequate number of viral copies(<138 copies/mL). A negative result must be combined with clinical observations, patient history, and  epidemiological information. The expected result is Negative.  Fact Sheet for Patients:  EntrepreneurPulse.com.au  Fact Sheet for Healthcare Providers:  IncredibleEmployment.be  This test is no t yet approved or cleared by the [place] FDA and  has been authorized for detection and/or diagnosis of SARS-CoV-2 by FDA under an Emergency Use Authorization (EUA). This EUA will remain  in effect (meaning this test can be used) for the duration of the COVID-19 declaration under Section 564(b)(1) of the Act, 21 U.S.C.section 360bbb-3(b)(1), unless the authorization is terminated  or revoked sooner.       Influenza A by PCR NEGATIVE NEGATIVE Final   Influenza B by PCR NEGATIVE NEGATIVE Final    Comment: (NOTE) The Xpert Xpress SARS-CoV-2/FLU/RSV plus assay is intended as an aid in the diagnosis of influenza from Nasopharyngeal swab specimens and should not be used as a sole basis for treatment. Nasal washings and aspirates are unacceptable for Xpert Xpress SARS-CoV-2/FLU/RSV testing.  Fact Sheet for Patients: EntrepreneurPulse.com.au  Fact Sheet for Healthcare Providers: IncredibleEmployment.be  This test is not yet approved or cleared by the [place] FDA and has been authorized for detection and/or diagnosis of SARS-CoV-2 by FDA under an Emergency Use Authorization (EUA). This EUA will remain in effect (meaning this test  can be used) for the duration of the COVID-19 declaration under Section 564(b)(1) of the Act, 21 U.S.C. section 360bbb-3(b)(1), unless the authorization is terminated or revoked.  Performed at [facility], [place] [address], [place], [place] [address]   Respiratory (~20 pathogens) panel by PCR     Status: Abnormal   Collection Time: 01/23/22  5:30 PM   Specimen: Anterior Nasal Swab; Respiratory  Result Value Ref Range Status   Adenovirus NOT DETECTED NOT DETECTED  Final   Coronavirus 229E NOT DETECTED NOT DETECTED Final    Comment: (NOTE) The Coronavirus on the Respiratory Panel, DOES NOT test for the novel  Coronavirus (2019 nCoV)    Coronavirus HKU1 NOT DETECTED NOT DETECTED Final   Coronavirus NL63 NOT DETECTED NOT DETECTED Final   Coronavirus OC43 NOT DETECTED NOT DETECTED Final   Metapneumovirus NOT DETECTED NOT DETECTED Final   Rhinovirus / Enterovirus DETECTED (A) NOT DETECTED Final   Influenza A NOT DETECTED NOT DETECTED Final   Influenza B NOT DETECTED NOT DETECTED Final   Parainfluenza Virus 1 NOT DETECTED NOT DETECTED Final   Parainfluenza Virus 2 NOT DETECTED NOT DETECTED Final   Parainfluenza Virus 3 NOT DETECTED NOT DETECTED Final   Parainfluenza Virus 4 NOT DETECTED NOT DETECTED Final   Respiratory Syncytial Virus NOT DETECTED NOT DETECTED Final   Bordetella pertussis NOT DETECTED NOT DETECTED Final   Bordetella Parapertussis NOT DETECTED NOT DETECTED Final   Chlamydophila pneumoniae NOT DETECTED NOT DETECTED Final   Mycoplasma pneumoniae NOT DETECTED NOT DETECTED Final    Comment: Performed at [facility], [place]. [address] [place], [place] [address]     Labs: BNP (last 3 results) Recent Labs    03/08/21 1930 01/23/22 1540  BNP 572.9* 759.1*   Basic Metabolic Panel: Recent Labs  Lab 01/23/22 1540 01/24/22 0724 01/26/22 0524  NA 136 138 138  K 3.9 3.5 3.9  CL 104 103 104  CO2 '23 26 27  '$ GLUCOSE 126* 85 91  BUN '19 20 18  '$ CREATININE 0.99 0.85 0.74  CALCIUM 8.6* 8.3* 8.5*  MG  --   --  2.2   Liver Function Tests: Recent Labs  Lab 01/24/22 0724  AST 22  ALT 15  ALKPHOS 71  BILITOT 0.7  PROT 5.7*  ALBUMIN 2.6*   No results for input(s): LIPASE, AMYLASE in the last 168 hours. No results for input(s): AMMONIA in the last 168 hours. CBC: Recent Labs  Lab 01/23/22 1540 01/24/22 0724 01/26/22 0524  WBC 10.9* 8.6 7.8  NEUTROABS 9.7*  --  6.0  HGB 10.5* 9.7* 9.3*  HCT 34.3* 31.6* 30.7*  MCV 90.7  90.5 90.0  PLT 315 295 319   Cardiac Enzymes: No results for input(s): CKTOTAL, CKMB, CKMBINDEX, TROPONINI in the last 168 hours. BNP: Invalid input(s): POCBNP CBG: No results for input(s): GLUCAP in the last 168 hours. D-Dimer No results for input(s): DDIMER in the last 72 hours. Hgb A1c No results for input(s): HGBA1C in the last 72 hours. Lipid Profile No results for input(s): CHOL, HDL, LDLCALC, TRIG, CHOLHDL, LDLDIRECT in the last 72 hours. Thyroid function studies No results for input(s): TSH, T4TOTAL, T3FREE, THYROIDAB in the last 72 hours.  Invalid input(s): FREET3 Anemia work up No results for input(s): VITAMINB12, FOLATE, FERRITIN, TIBC, IRON, RETICCTPCT in the last 72 hours. Urinalysis    Component Value Date/Time   COLORURINE STRAW (A) 03/10/2021 2050   APPEARANCEUR CLEAR 03/10/2021 2050   LABSPEC 1.005 03/10/2021 2050  PHURINE 6.0 03/10/2021 2050   GLUCOSEU NEGATIVE 03/10/2021 2050   [place] NEGATIVE 03/10/2021 2050   [place] NEGATIVE 03/10/2021 2050   [place] NEGATIVE 03/10/2021 2050   PROTEINUR NEGATIVE 03/10/2021 2050   NITRITE NEGATIVE 03/10/2021 2050   LEUKOCYTESUR NEGATIVE 03/10/2021 2050   Sepsis Labs Invalid input(s): PROCALCITONIN,  WBC,  LACTICIDVEN Microbiology Recent Results (from the past 240 hour(s))  Resp Panel by RT-PCR (Flu A&B, Covid) Anterior Nasal Swab     Status: None   Collection Time: 01/23/22  5:30 PM   Specimen: Anterior Nasal Swab  Result Value Ref Range Status   SARS Coronavirus 2 by RT PCR NEGATIVE NEGATIVE Final    Comment: (NOTE) SARS-CoV-2 target nucleic acids are NOT DETECTED.  The SARS-CoV-2 RNA is generally detectable in upper respiratory specimens during the acute phase of infection. The lowest concentration of SARS-CoV-2 viral copies this assay can detect is 138 copies/mL. A negative result does not preclude SARS-Cov-2 infection and should not be used as the sole basis for treatment or other patient management  decisions. A negative result may occur with  improper specimen collection/handling, submission of specimen other than nasopharyngeal swab, presence of viral mutation(s) within the areas targeted by this assay, and inadequate number of viral copies(<138 copies/mL). A negative result must be combined with clinical observations, patient history, and epidemiological information. The expected result is Negative.  Fact Sheet for Patients:  EntrepreneurPulse.com.au  Fact Sheet for Healthcare Providers:  IncredibleEmployment.be  This test is no t yet approved or cleared by the [place] FDA and  has been authorized for detection and/or diagnosis of SARS-CoV-2 by FDA under an Emergency Use Authorization (EUA). This EUA will remain  in effect (meaning this test can be used) for the duration of the COVID-19 declaration under Section 564(b)(1) of the Act, 21 U.S.C.section 360bbb-3(b)(1), unless the authorization is terminated  or revoked sooner.       Influenza A by PCR NEGATIVE NEGATIVE Final   Influenza B by PCR NEGATIVE NEGATIVE Final    Comment: (NOTE) The Xpert Xpress SARS-CoV-2/FLU/RSV plus assay is intended as an aid in the diagnosis of influenza from Nasopharyngeal swab specimens and should not be used as a sole basis for treatment. Nasal washings and aspirates are unacceptable for Xpert Xpress SARS-CoV-2/FLU/RSV testing.  Fact Sheet for Patients: EntrepreneurPulse.com.au  Fact Sheet for Healthcare Providers: IncredibleEmployment.be  This test is not yet approved or cleared by the [place] FDA and has been authorized for detection and/or diagnosis of SARS-CoV-2 by FDA under an Emergency Use Authorization (EUA). This EUA will remain in effect (meaning this test can be used) for the duration of the COVID-19 declaration under Section 564(b)(1) of the Act, 21 U.S.C. section 360bbb-3(b)(1), unless the  authorization is terminated or revoked.  Performed at [facility], [place] [address], [place], [place] [address]   Respiratory (~20 pathogens) panel by PCR     Status: Abnormal   Collection Time: 01/23/22  5:30 PM   Specimen: Anterior Nasal Swab; Respiratory  Result Value Ref Range Status   Adenovirus NOT DETECTED NOT DETECTED Final   Coronavirus 229E NOT DETECTED NOT DETECTED Final    Comment: (NOTE) The Coronavirus on the Respiratory Panel, DOES NOT test for the novel  Coronavirus (2019 nCoV)    Coronavirus HKU1 NOT DETECTED NOT DETECTED Final   Coronavirus NL63 NOT DETECTED NOT DETECTED Final   Coronavirus OC43 NOT DETECTED NOT DETECTED Final   Metapneumovirus NOT DETECTED NOT DETECTED Final   Rhinovirus /  Enterovirus DETECTED (A) NOT DETECTED Final   Influenza A NOT DETECTED NOT DETECTED Final   Influenza B NOT DETECTED NOT DETECTED Final   Parainfluenza Virus 1 NOT DETECTED NOT DETECTED Final   Parainfluenza Virus 2 NOT DETECTED NOT DETECTED Final   Parainfluenza Virus 3 NOT DETECTED NOT DETECTED Final   Parainfluenza Virus 4 NOT DETECTED NOT DETECTED Final   Respiratory Syncytial Virus NOT DETECTED NOT DETECTED Final   Bordetella pertussis NOT DETECTED NOT DETECTED Final   Bordetella Parapertussis NOT DETECTED NOT DETECTED Final   Chlamydophila pneumoniae NOT DETECTED NOT DETECTED Final   Mycoplasma pneumoniae NOT DETECTED NOT DETECTED Final    Comment: Performed at [facility], [place] [address], [place], [place] [address]     Time coordinating discharge: 35 minutes  SIGNED:   Aline August, MD  [facility] 01/27/2022, 7:50 AM

## 2022-03-03 ENCOUNTER — Telehealth (HOSPITAL_BASED_OUTPATIENT_CLINIC_OR_DEPARTMENT_OTHER): Payer: Self-pay | Admitting: *Deleted

## 2022-03-03 ENCOUNTER — Other Ambulatory Visit (HOSPITAL_BASED_OUTPATIENT_CLINIC_OR_DEPARTMENT_OTHER): Payer: Self-pay | Admitting: Family Medicine

## 2022-03-03 ENCOUNTER — Ambulatory Visit (INDEPENDENT_AMBULATORY_CARE_PROVIDER_SITE_OTHER): Payer: Medicare Other

## 2022-03-03 DIAGNOSIS — M7989 Other specified soft tissue disorders: Secondary | ICD-10-CM | POA: Diagnosis not present

## 2022-03-03 DIAGNOSIS — M79661 Pain in right lower leg: Secondary | ICD-10-CM

## 2022-03-03 NOTE — Telephone Encounter (Signed)
Left message for patient to call and schedule the lower extremity venous doppler ordered by Dr. Horald Pollen

## 2022-03-14 ENCOUNTER — Encounter (HOSPITAL_BASED_OUTPATIENT_CLINIC_OR_DEPARTMENT_OTHER): Payer: Self-pay | Admitting: Family

## 2022-03-14 ENCOUNTER — Ambulatory Visit (INDEPENDENT_AMBULATORY_CARE_PROVIDER_SITE_OTHER): Payer: Medicare Other | Admitting: Family

## 2022-03-14 VITALS — BP 128/66 | HR 64 | Ht 62.0 in | Wt 104.0 lb

## 2022-03-14 DIAGNOSIS — I6523 Occlusion and stenosis of bilateral carotid arteries: Secondary | ICD-10-CM | POA: Diagnosis not present

## 2022-03-14 DIAGNOSIS — I739 Peripheral vascular disease, unspecified: Secondary | ICD-10-CM | POA: Diagnosis not present

## 2022-03-14 NOTE — Progress Notes (Unsigned)
Office Visit    Patient Name: Jasmin Crane Date of Encounter: 03/14/2022  PCP:  Hayden Rasmussen, MD   [place]  Cardiologist:  Minus Breeding, MD *** Advanced Practice Provider:  No care team member to display Electrophysiologist:  None  {Press F2 to show EP APP, CHF, sleep or structural heart MD               :[id number]}  { Click here to update then REFRESH NOTE - MD (PCP) or APP (Team Member)  Change PCP Type for MD, Specialty for APP is either Cardiology or Clinical Cardiac Electrophysiology  :[id number]}  Chief Complaint    Jasmin Crane is a 88 y.o. female with a hx of *** presents today for ***   Past Medical History    Past Medical History:  Diagnosis Date   Arrhythmia    Cancer ([place])    skin cancer 2005   Carotid artery occlusion    Claudication of lower extremity ([place])    Coronary artery disease    Diverticulitis    Hyperlipidemia    Hypertension    Hypothyroidism    Myopathy    Paralysis of tongue    Sick sinus syndrome ([place])    Past Surgical History:  Procedure Laterality Date   APPENDECTOMY     CAROTID ENDARTERECTOMY     CATARACT EXTRACTION, BILATERAL     CESAREAN SECTION     COLONOSCOPY     CORONARY ANGIOPLASTY     CORONARY STENT PLACEMENT     right coronary artery [place] Right 02/25/2019   Procedure: RIGHT INGUINAL HERNIA REPAIR WITH MESH;  Surgeon: Donnie Mesa, MD;  Location: [facility];  Service: General;  Laterality: Right;  LMA AND TAP BLOCK   MULTIPLE TOOTH EXTRACTIONS     SALIVARY GLAND SURGERY     removed 1998   SALPINGECTOMY     SKIN CANCER EXCISION     2005    Allergies  Allergies  Allergen Reactions   Shellfish Allergy Swelling and Anaphylaxis   Cozaar [Losartan] Nausea Only   Pletaal [Cilostazol] Palpitations    History of Present Illness    Jasmin Crane is a 88 y.o. female with a hx of *** last seen ***.   EKGs/Labs/Other Studies Reviewed:   The following studies were  reviewed today: ***  EKG:  EKG is *** ordered today.  The ekg ordered today demonstrates ***  Recent Labs: 01/23/2022: B Natriuretic Peptide 648.0; TSH 2.546 01/24/2022: ALT 15 01/26/2022: BUN 18; Creatinine, Ser 0.74; Hemoglobin 9.3; Magnesium 2.2; Platelets 319; Potassium 3.9; Sodium 138  Recent Lipid Panel    Component Value Date/Time   CHOL 185 06/11/2018 1026   TRIG 77 06/11/2018 1026   HDL 88 06/11/2018 1026   CHOLHDL 2.1 06/11/2018 1026   CHOLHDL 2.0 09/01/2016 1226   VLDL 15 09/01/2016 1226   LDLCALC 82 06/11/2018 1026    Risk Assessment/Calculations:  {Does this patient have ATRIAL FIBRILLATION?:[id number]}  Home Medications   Current Meds  Medication Sig   aspirin EC 81 MG tablet Take 162 mg by mouth daily.   citalopram (CELEXA) 10 MG tablet Take 10 mg by mouth daily.   donepezil (ARICEPT) 10 MG tablet Take 10 mg by mouth in the morning.   ipratropium (ATROVENT) 0.06 % nasal spray 2 sprays 2 (two) times daily as needed (allergies).   levothyroxine (SYNTHROID) 88 MCG tablet Take 88 mcg by mouth every evening.   metoprolol succinate (  TOPROL-XL) 25 MG 24 hr tablet TAKE 1 TABLET BY MOUTH EVERY DAY   rosuvastatin (CRESTOR) 40 MG tablet TAKE 1 TABLET BY MOUTH NIGHTLY AT BEDTIME     Review of Systems   ***   All other systems reviewed and are otherwise negative except as noted above.  Physical Exam    VS:  BP 128/66 (BP Location: Left Arm, Patient Position: Sitting, Cuff Size: Normal)   Pulse 64   Ht '5\' 2"'$  (1.575 m)   Wt 104 lb (47.2 kg)   SpO2 94%   BMI 19.02 kg/m  , BMI Body mass index is 19.02 kg/m.  Wt Readings from Last 3 Encounters:  03/14/22 104 lb (47.2 kg)  01/25/22 109 lb 2 oz (49.5 kg)  06/03/21 97 lb (44 kg)     GEN: Well nourished, well developed, in no acute distress. HEENT: normal. Neck: Supple, no JVD, carotid bruits, or masses. Cardiac: ***RRR, no murmurs, rubs, or gallops. No clubbing, cyanosis, edema.  ***Radials/PT 2+ and equal  bilaterally.  Respiratory:  ***Respirations regular and unlabored, clear to auscultation bilaterally. GI: Soft, nontender, nondistended. MS: No deformity or atrophy. Skin: Warm and dry, no rash. Neuro:  Strength and sensation are intact. Psych: Normal affect.  Assessment & Plan    ***     Disposition: Follow up {follow up:15908} with Minus Breeding, MD or APP.  Signed, Loel Dubonnet, NP 03/14/2022, 11:16 AM [place]

## 2022-03-14 NOTE — Patient Instructions (Addendum)
Medication Instructions:  Your Physician recommend you continue on your current medication as directed.    *If you need a refill on your cardiac medications before your next appointment, please call your pharmacy*   Testing:  Your physician has requested that you have a carotid duplex. This test is an ultrasound of the carotid arteries in your neck. It looks at blood flow through these arteries that supply the brain with blood. Allow one hour for this exam. There are no restrictions or special instructions.  Your physician has requested that you have an ankle brachial index (ABI). During this test an ultrasound and blood pressure cuff are used to evaluate the arteries that supply the arms and legs with blood. Allow thirty minutes for this exam. There are no restrictions or special instructions.  Your physician has requested that you have an Ultrasound of your Iliac and Aorta.    Follow-Up: At [facility], you and your health needs are our priority.  As part of our continuing mission to provide you with exceptional heart care, we have created designated Provider Care Teams.  These Care Teams include your primary Cardiologist (physician) and Advanced Practice Providers (APPs -  Physician Assistants and Nurse Practitioners) who all work together to provide you with the care you need, when you need it.  We recommend signing up for the patient portal called "MyChart".  Sign up information is provided on this After Visit Summary.  MyChart is used to connect with patients for Virtual Visits (Telemedicine).  Patients are able to view lab/test results, encounter notes, upcoming appointments, etc.  Non-urgent messages can be sent to your provider as well.   To learn more about what you can do with MyChart, go to [email or web address].    Your next appointment:   Follow up as scheduled   Other Instructions Heart Healthy Diet Recommendations: A low-salt diet is recommended. Meats should be grilled,  baked, or boiled. Avoid fried foods. Focus on lean protein sources like fish or chicken with vegetables and fruits. The American Heart Association is a Microbiologist!  American Heart Association Diet and Lifeystyle Recommendations   Exercise recommendations: The American Heart Association recommends 150 minutes of moderate intensity exercise weekly. Try 30 minutes of moderate intensity exercise 4-5 times per week. This could include walking, jogging, or swimming.   Important Information About Sugar

## 2022-03-15 ENCOUNTER — Encounter (HOSPITAL_BASED_OUTPATIENT_CLINIC_OR_DEPARTMENT_OTHER): Payer: Self-pay | Admitting: Family

## 2022-04-20 ENCOUNTER — Ambulatory Visit: Payer: Medicare Other | Admitting: Cardiology

## 2022-05-08 ENCOUNTER — Encounter (HOSPITAL_COMMUNITY): Payer: Medicare Other

## 2022-05-09 ENCOUNTER — Encounter (HOSPITAL_COMMUNITY): Payer: Medicare Other

## 2022-05-09 ENCOUNTER — Ambulatory Visit (HOSPITAL_BASED_OUTPATIENT_CLINIC_OR_DEPARTMENT_OTHER)
Admission: RE | Admit: 2022-05-09 | Discharge: 2022-05-09 | Disposition: A | Discharge status: Home / Self Care | Payer: Medicare Other | Source: Ambulatory Visit | Attending: Family | Admitting: Family

## 2022-05-09 ENCOUNTER — Ambulatory Visit (HOSPITAL_BASED_OUTPATIENT_CLINIC_OR_DEPARTMENT_OTHER)
Admission: RE | Admit: 2022-05-09 | Discharge: 2022-05-09 | Disposition: A | Discharge status: Home / Self Care | Payer: Medicare Other | Source: Ambulatory Visit | Attending: Cardiology | Admitting: Cardiology

## 2022-05-09 ENCOUNTER — Ambulatory Visit (HOSPITAL_COMMUNITY)
Admission: RE | Admit: 2022-05-09 | Discharge: 2022-05-09 | Disposition: A | Discharge status: Home / Self Care | Payer: Medicare Other | Source: Ambulatory Visit | Attending: Cardiology | Admitting: Cardiology

## 2022-05-09 DIAGNOSIS — I739 Peripheral vascular disease, unspecified: Secondary | ICD-10-CM

## 2022-05-09 DIAGNOSIS — I7092 Chronic total occlusion of artery of the extremities: Secondary | ICD-10-CM

## 2022-05-09 DIAGNOSIS — I6523 Occlusion and stenosis of bilateral carotid arteries: Secondary | ICD-10-CM | POA: Diagnosis present

## 2022-05-11 ENCOUNTER — Telehealth (HOSPITAL_BASED_OUTPATIENT_CLINIC_OR_DEPARTMENT_OTHER): Payer: Self-pay

## 2022-05-11 DIAGNOSIS — I739 Peripheral vascular disease, unspecified: Secondary | ICD-10-CM

## 2022-05-11 DIAGNOSIS — I6523 Occlusion and stenosis of bilateral carotid arteries: Secondary | ICD-10-CM

## 2022-05-11 NOTE — Telephone Encounter (Addendum)
Left message for patient to call back  ----- Message from Loel Dubonnet, NP sent at 05/11/2022 12:13 PM EDT ----- Carotid duplex with bilateral 40-59% stenosis.  Stable compared to previous.  Recommend repeat duplex in 1 year for monitoring.  Loel Dubonnet, NP  Gerald Stabs, RN ABI reveals bilateral lower extremity arterial disease.  Similar compared to previous.   Loel Dubonnet, NP  Gerald Stabs, RN Bilateral common iliac arteries greater than 50% stenosed.  Aortic diameter overall unchanged from previous.  Essentially stable from prior.  No changes at this time.

## 2022-05-12 ENCOUNTER — Encounter: Payer: Self-pay | Admitting: *Deleted

## 2022-05-12 NOTE — Telephone Encounter (Signed)
  Pt's daughter is returning call, she asked if she can get a call after 3:30 pm today

## 2022-05-12 NOTE — Telephone Encounter (Signed)
Advised patient and daughter, they were on speaker phone together

## 2022-05-20 IMAGING — CT CT HEAD W/O CM
4 series · 16 of 47 positions shown, 18 images · non-contrast
Comparison: None.

CLINICAL DATA: Mental status change. Delirium. Oxygen desaturation
episode of encephalopathy related to urinary tract infection 3
months ago.

EXAM:
CT HEAD WITHOUT CONTRAST
TECHNIQUE: Contiguous axial images were obtained from the base of the skull
through the vertex without intravenous contrast.

[Series 2: head wo · axial · 0.44mm/px · z∈[+1437,+1547]mm · 7 of 30 slices shown, 9 images]
[im 4/30  brain]
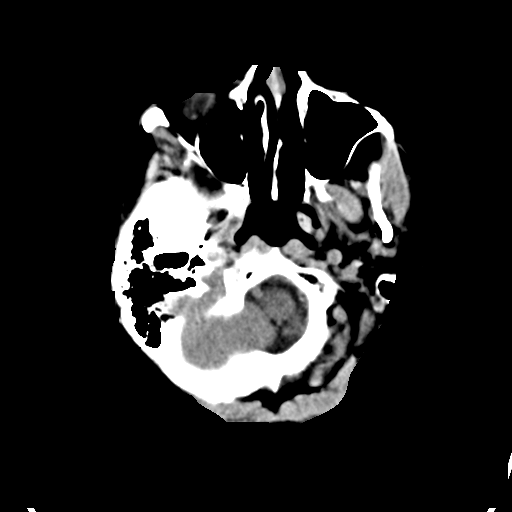
[im 4/30  bone]
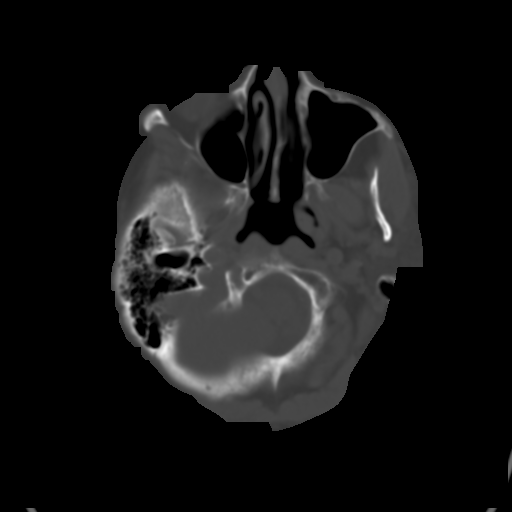
[im 8/30  brain]
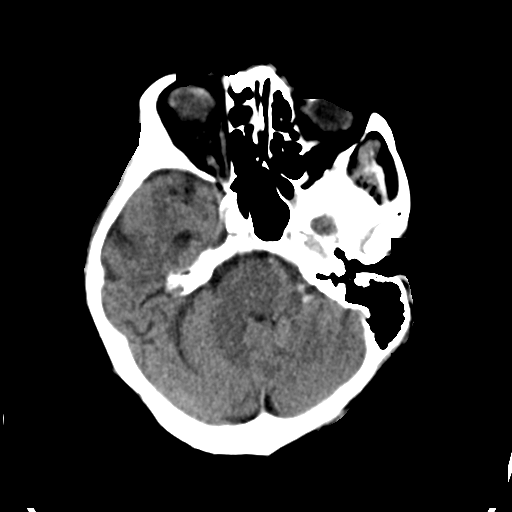
[im 11/30  brain]
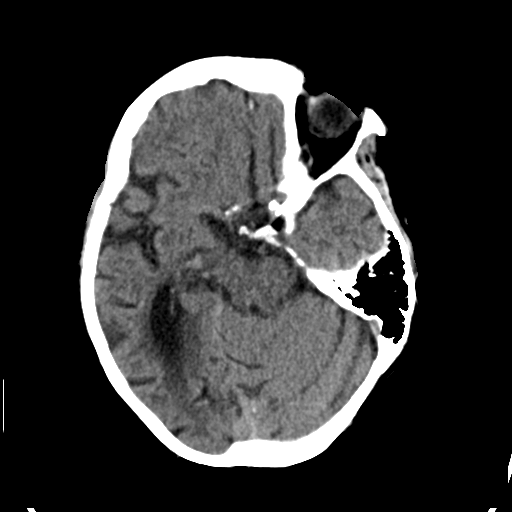
[im 15/30  brain]
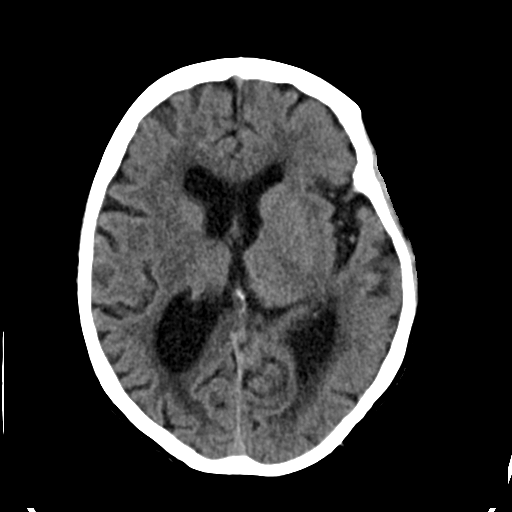
[im 19/30  brain]
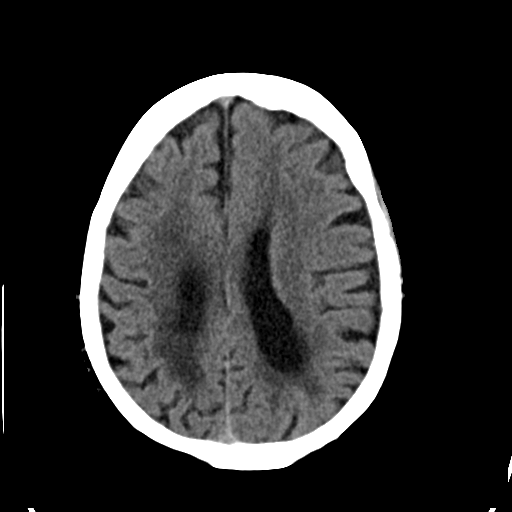
[im 19/30  bone]
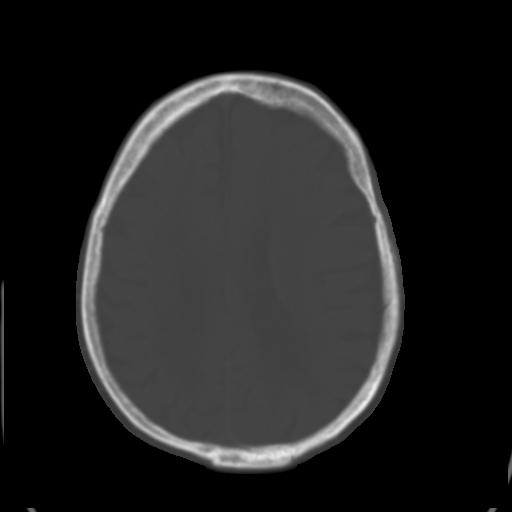
[im 22/30  brain]
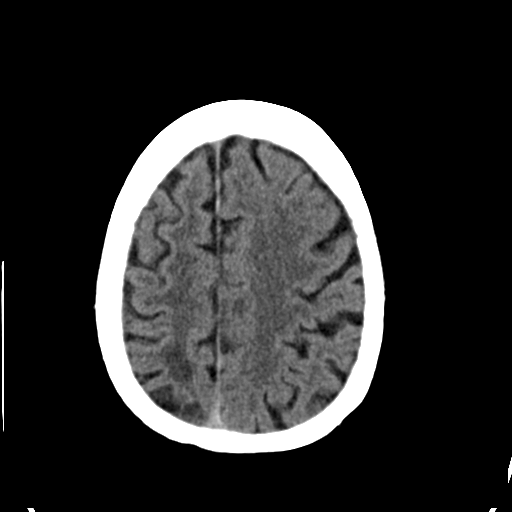
[im 26/30  brain]
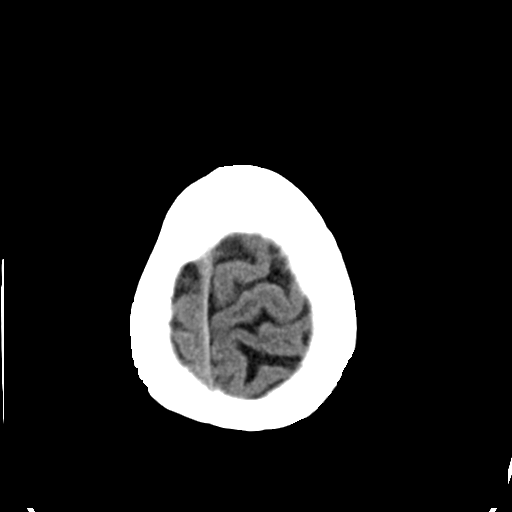

[Series 3: head bone · axial · 0.44mm/px · z∈[+1436,+1466]mm · 3 of 75 slices shown]
[im 8/75  bone]
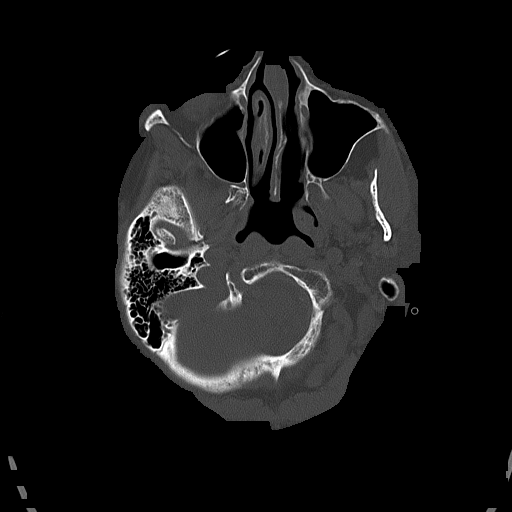
[im 15/75  bone]
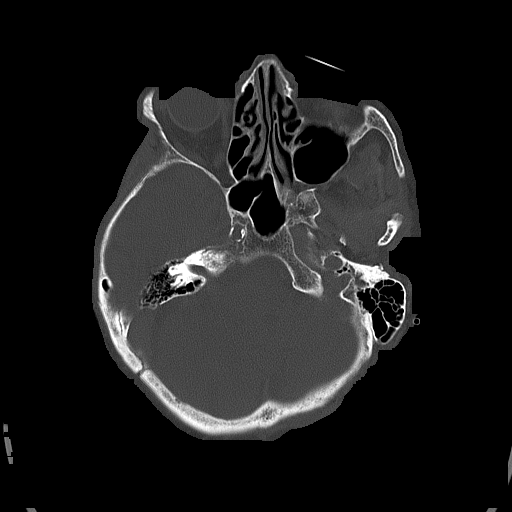
[im 23/75  bone]
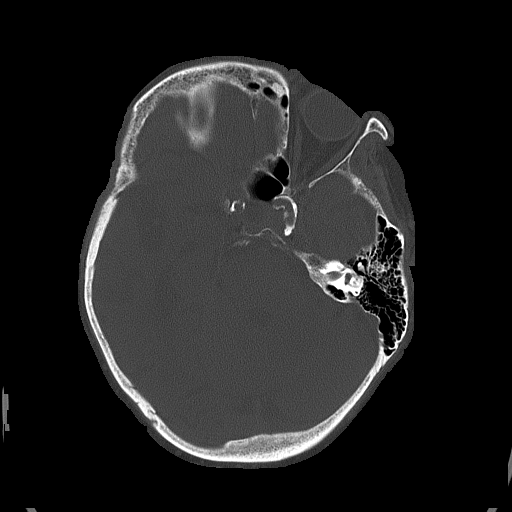

[Series 4: coronal soft tissue · coronal · 0.30mm/px · 3 of 67 slices shown]
[im 23/67  brain]
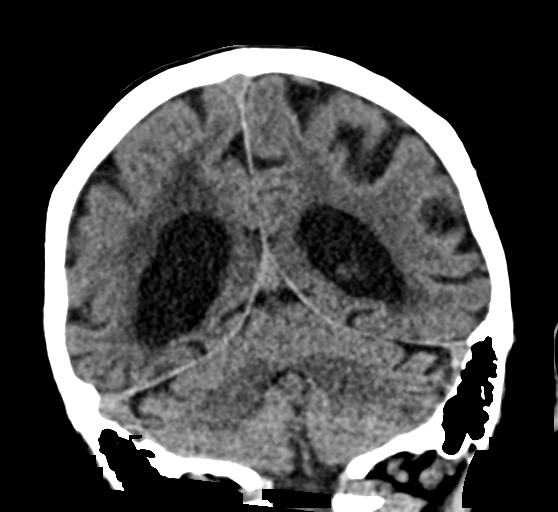
[im 30/67  brain]
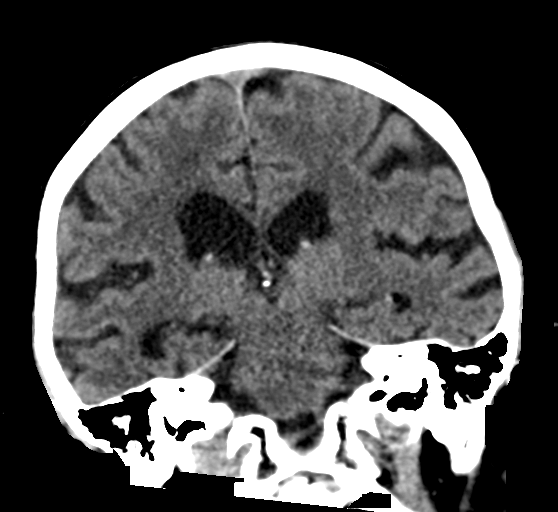
[im 37/67  brain]
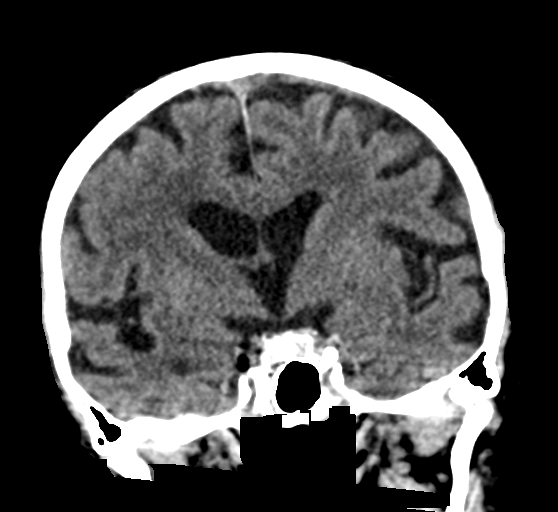

[Series 5: sagittal soft tissue · sagittal · 0.32mm/px · 3 of 55 slices shown]
[im 19/55  brain]
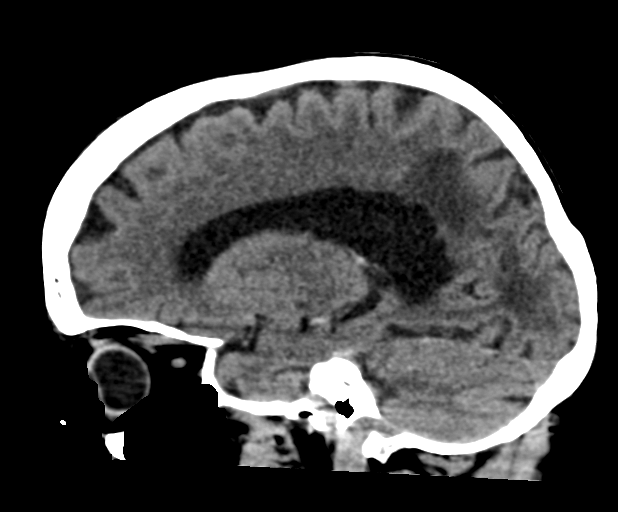
[im 28/55  brain]
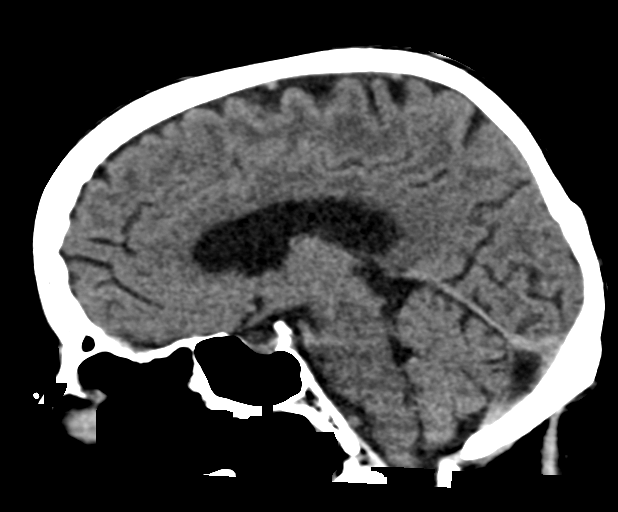
[im 36/55  brain]
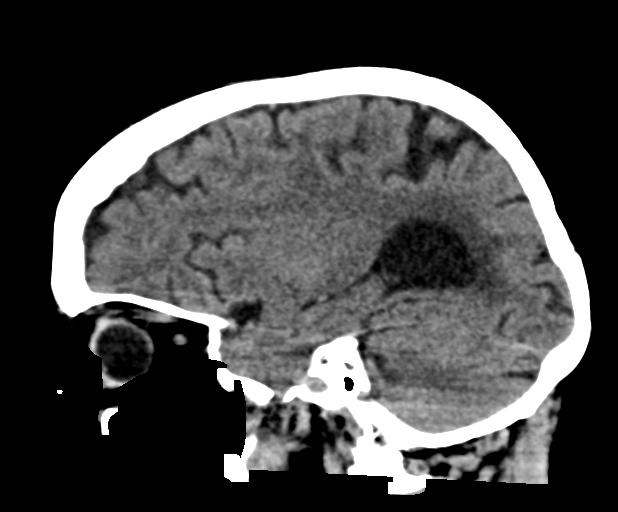

[16 of 47 positions shown; findings below may reference images not displayed]

FINDINGS: Brain: Moderate atrophy and diffuse confluent periventricular white
matter hypoattenuation is present bilaterally. No acute infarct,
hemorrhage, or mass lesion is present. White matter changes are most
pronounced posteriorly with disc proportionate posterior enlargement
of the ventricles. This is worse right than left. The brainstem and
cerebellum are within normal limits.

Vascular: Atherosclerotic calcifications are present within the
cavernous internal carotid arteries. Calcifications are present at
the dural margin of the right vertebral artery. No hyperdense vessel
is present.

Skull: Calvarium is intact. No focal lytic or blastic lesions are
present. No significant extracranial soft tissue lesion is present.

Sinuses/Orbits: The paranasal sinuses and mastoid air cells are
clear. Bilateral lens replacements are noted. Globes and orbits are
otherwise unremarkable.
IMPRESSION: 1. Moderate atrophy and diffuse confluent periventricular white
matter hypoattenuation bilaterally. This likely reflects the sequela
of chronic microvascular ischemia.
2. No acute intracranial abnormality.

## 2022-05-27 NOTE — Progress Notes (Deleted)
Cardiology Office Note   Date:  05/27/2022   ID:  Jasmin Crane, DOB 01-15-1932, MRN [id number]  PCP:  Hayden Rasmussen, MD  Cardiologist:   Minus Breeding, MD    No chief complaint on file.     History of Present Illness: Jasmin Crane is a 88 y.o. female who presents for evaluation of coronary artery disease. She was living in [place] and moved here with her daughter.  The first diagnosis of coronary disease was apparently in 1998. She had stenting of her right coronary artery that time. She's had a well preserved ejection fraction over the years. Her last echocardiogram in August of 2018 demonstrated an EF of 60%. She does have some aortic sclerosis with moderate aortic insufficiency.  She does have carotid stenosis with right 80% stenosis and left 15-49% stenosis. She has some moderately had normal ABIs left greater than right. She had a stress perfusion study in August 2017 that demonstrated moderate to high suspicion for ischemia. However, this was followed up with coronary CTA. She had a long calcification of the left main and proximal LAD but the severity of this could not be quantified secondary to motion artifact. There was moderate stenosis of the left circumflex. The right coronary artery could not be assessed secondary to artifact. It was decided however manage her medically.    Her most recent echo 02/2021 EF 55-60%, no RWMA, mild LVH, grade 2 diastolic dysfunction, RV SF mildly reduced, normal PASP, LA mildly dilated, trivial MR, mild AI, mild aortic sclerosis without stenosis.  Carotid duplex 05/2021 bilateral 40-59% stenosis.  She was admitted 5/29-01/27/22 with possible LLL viral PNA treated with IV abx, prednisone. She had recent fall with left calcaneal fracture/left fifth metatarsal fracture. Due to some LE edema, PCP ordered venous duplex 03/03/22 with complex cystic structure posterior to right common femoral vessels 2.6 x 1.8 x 1.7, known right prox SFA occlusion, no  evidence of SVT bilaterally.   ***   *** she fell and broke her humerus.  This was in July.  I reviewed records including lots of imaging.  The only cardiac issue seem to be some diastolic heart failure.  I did see an echocardiogram which demonstrated the EF to be 50 to 60%.  She had some diastolic dysfunction.  The aortic valve had mild aortic sclerosis and mild regurgitation.  She gets around at home and she is not describing any new shortness of breath, PND or orthopnea.  She is not having any palpitations, presyncope or syncope.  Her fall was not a loss of consciousness.  She is not having any chest pressure, neck or arm discomfort.  She denies any leg pain.   Past Medical History:  Diagnosis Date   Arrhythmia    Cancer [facility])    skin cancer 2005   Carotid artery occlusion    Claudication of lower extremity ([facility])    Coronary artery disease    Diverticulitis    Hyperlipidemia    Hypertension    Hypothyroidism    Myopathy    Paralysis of tongue    Sick sinus syndrome ([facility])     Past Surgical History:  Procedure Laterality Date   APPENDECTOMY     CAROTID ENDARTERECTOMY     CATARACT EXTRACTION, BILATERAL     CESAREAN SECTION     COLONOSCOPY     CORONARY ANGIOPLASTY     CORONARY STENT PLACEMENT     right coronary artery 1998   INGUINAL HERNIA REPAIR Right  02/25/2019   Procedure: RIGHT INGUINAL HERNIA REPAIR WITH MESH;  Surgeon: Donnie Mesa, MD;  Location: [facility];  Service: General;  Laterality: Right;  LMA AND TAP BLOCK   MULTIPLE TOOTH EXTRACTIONS     SALIVARY GLAND SURGERY     removed 1998   SALPINGECTOMY     SKIN CANCER EXCISION     2005     Current Outpatient Medications  Medication Sig Dispense Refill   aspirin EC 81 MG tablet Take 162 mg by mouth daily.     citalopram (CELEXA) 10 MG tablet Take 10 mg by mouth daily.     donepezil (ARICEPT) 10 MG tablet Take 10 mg by mouth in the morning.     ipratropium (ATROVENT) 0.06 % nasal spray 2 sprays 2 (two) times  daily as needed (allergies).     levothyroxine (SYNTHROID) 88 MCG tablet Take 88 mcg by mouth every evening.     metoprolol succinate (TOPROL-XL) 25 MG 24 hr tablet TAKE 1 TABLET BY MOUTH EVERY DAY 90 tablet 1   rosuvastatin (CRESTOR) 40 MG tablet TAKE 1 TABLET BY MOUTH NIGHTLY AT BEDTIME 90 tablet 3   No current facility-administered medications for this visit.    Allergies:   Shellfish allergy, Cozaar [losartan], and Pletaal [cilostazol]    ROS:  Please see the history of present illness.   Otherwise, review of systems are positive for ***  All other systems are reviewed and negative.    PHYSICAL EXAM: VS:  There were no vitals taken for this visit. , BMI There is no height or weight on file to calculate BMI.  GENERAL:  Well appearing NECK:  No jugular venous distention, waveform within normal limits, carotid upstroke brisk and symmetric, no bruits, no thyromegaly LUNGS:  Clear to auscultation bilaterally CHEST:  Unremarkable HEART:  PMI not displaced or sustained,S1 and S2 within normal limits, no S3, no S4, no clicks, no rubs, *** murmurs ABD:  Flat, positive bowel sounds normal in frequency in pitch, no bruits, no rebound, no guarding, no midline pulsatile mass, no hepatomegaly, no splenomegaly EXT:  2 plus pulses throughout, no edema, no cyanosis no clubbing   ***GENERAL:  Well appearing NECK:  No jugular venous distention, waveform within normal limits, carotid upstroke brisk and symmetric, bilateral carotid bruits, no thyromegaly LUNGS:  Clear to auscultation bilaterally CHEST:  Unremarkable HEART:  PMI not displaced or sustained,S1 and S2 within normal limits, no S3, no S4, no clicks, no rubs, no murmurs ABD:  Flat, positive bowel sounds normal in frequency in pitch, left femoral bruits, no rebound, no guarding, no midline pulsatile mass, no hepatomegaly, no splenomegaly EXT:  2 plus pulses upper, absent dorsalis pedis and posterior tibialis bilateral lower, no edema, no  cyanosis no clubbing  EKG:  EKG is *** ordered today. *** sinus rhythm, premature atrial contractions, left ventricular hypertrophy, no acute ST-T wave changes.  Recent Labs: 01/23/2022: B Natriuretic Peptide 648.0; TSH 2.546 01/24/2022: ALT 15 01/26/2022: BUN 18; Creatinine, Ser 0.74; Hemoglobin 9.3; Magnesium 2.2; Platelets 319; Potassium 3.9; Sodium 138    Lipid Panel    Component Value Date/Time   CHOL 185 06/11/2018 1026   TRIG 77 06/11/2018 1026   HDL 88 06/11/2018 1026   CHOLHDL 2.1 06/11/2018 1026   CHOLHDL 2.0 09/01/2016 1226   VLDL 15 09/01/2016 1226   LDLCALC 82 06/11/2018 1026      Wt Readings from Last 3 Encounters:  03/14/22 104 lb (47.2 kg)  01/25/22 109 lb 2 oz (49.5  kg)  06/03/21 97 lb (44 kg)      Other studies Reviewed: Additional studies/ records that were reviewed today include:   *** Review of the above records demonstrates:  See above   ASSESSMENT AND PLAN:  CAD:   ***  She has had no unstable angina symptoms.  No suggestion of ischemia during her acute illness.  She will continue with risk reduction.   PVD:   She has significant lower extremity vascular disease ***  per ABI but she has no symptoms and would want.  Conservative management of this.  Therefore, no further imaging.   DYSLIPIDEMIA:     LDL was 82 *** but her HDL was 88.  However, these were old labs.  I will repeat a lipid profile.    HTN:  The blood pressure is *** at target.  No change in therapy.   AI:   ***  This was mild as above.  No change in therapy.   AAA:  ***    This was she wants conservative management of this.  No further imaging.   CAROTID STENOSIS:     ***  She does agree to further imaging if this as she would allow intervention.  She had bilateral 60% carotid stenosis previously.  She is due to have follow-up later this month.   Current medicines are reviewed at length with the patient today.  The patient does not have concerns regarding medicines.  The following  changes have been made:  ***    Labs/ tests ordered today include:    ***    No orders of the defined types were placed in this encounter.     Disposition:   FU with me in *** months.   Signed, Minus Breeding, MD  05/27/2022 1:11 PM    [place]

## 2022-05-29 ENCOUNTER — Encounter: Payer: Self-pay | Admitting: Cardiology

## 2022-05-29 ENCOUNTER — Ambulatory Visit: Payer: Medicare Other | Admitting: Cardiology

## 2022-05-29 ENCOUNTER — Ambulatory Visit: Payer: Medicare Other | Attending: Cardiology | Admitting: Cardiology

## 2022-05-29 VITALS — BP 126/52 | HR 61 | Ht 62.0 in | Wt 97.8 lb

## 2022-05-29 DIAGNOSIS — I2510 Atherosclerotic heart disease of native coronary artery without angina pectoris: Secondary | ICD-10-CM | POA: Insufficient documentation

## 2022-05-29 DIAGNOSIS — I714 Abdominal aortic aneurysm, without rupture, unspecified: Secondary | ICD-10-CM

## 2022-05-29 DIAGNOSIS — I739 Peripheral vascular disease, unspecified: Secondary | ICD-10-CM | POA: Diagnosis not present

## 2022-05-29 DIAGNOSIS — I779 Disorder of arteries and arterioles, unspecified: Secondary | ICD-10-CM

## 2022-05-29 DIAGNOSIS — E785 Hyperlipidemia, unspecified: Secondary | ICD-10-CM | POA: Diagnosis not present

## 2022-05-29 DIAGNOSIS — I6523 Occlusion and stenosis of bilateral carotid arteries: Secondary | ICD-10-CM

## 2022-05-29 DIAGNOSIS — I2583 Coronary atherosclerosis due to lipid rich plaque: Secondary | ICD-10-CM | POA: Diagnosis not present

## 2022-05-29 DIAGNOSIS — I10 Essential (primary) hypertension: Secondary | ICD-10-CM

## 2022-05-29 NOTE — Progress Notes (Signed)
Cardiology Office Note   Date:  05/29/2022   ID:  Jasmin Crane, DOB 04/26/1932, MRN [id number]  PCP:  Jasmin Rasmussen, MD  Cardiologist:   Jasmin Breeding, MD    Chief Complaint  Patient presents with   PVD      History of Present Illness: Jasmin Crane is a 88 y.o. female who presents for evaluation of coronary artery disease. She was living in [place] and moved here with her daughter.  The first diagnosis of coronary disease was apparently in 1998. She had stenting of her right coronary artery that time. She's had a well preserved ejection fraction over the years. Her last echocardiogram in August of 2018 demonstrated an EF of 60%. She does have some aortic sclerosis with moderate aortic insufficiency.  She does have carotid stenosis with right 80% stenosis and left 15-49% stenosis. She has some moderately had normal ABIs left greater than right. She had a stress perfusion study in August 2017 that demonstrated moderate to high suspicion for ischemia. However, this was followed up with coronary CTA. She had a long calcification of the left main and proximal LAD but the severity of this could not be quantified secondary to motion artifact. There was moderate stenosis of the left circumflex. The right coronary artery could not be assessed secondary to artifact. It was decided however manage her medically.    Her most recent echo 02/2021 EF 55-60%, no RWMA, mild LVH, grade 2 diastolic dysfunction, RV SF mildly reduced, normal PASP, LA mildly dilated, trivial MR, mild AI, mild aortic sclerosis without stenosis.  Carotid duplex 05/2021 bilateral 40-59% stenosis.  She was admitted 5/29-01/27/22 with possible LLL viral PNA treated with IV abx, prednisone. She had recent fall with left calcaneal fracture/left fifth metatarsal fracture. Due to some LE edema, PCP ordered venous duplex 03/03/22 with complex cystic structure posterior to right common femoral vessels 2.6 x 1.8 x 1.7, known right prox  SFA occlusion, no evidence of SVT bilaterally.   Since I saw her she has done well.  She gets around with a walker.  The patient denies any new symptoms such as chest discomfort, neck or arm discomfort. There has been no new shortness of breath, PND or orthopnea. There have been no reported palpitations, presyncope or syncope.   Past Medical History:  Diagnosis Date   Arrhythmia    Cancer [facility])    skin cancer 2005   Carotid artery occlusion    Claudication of lower extremity (HCC)    Coronary artery disease    Diverticulitis    Hyperlipidemia    Hypertension    Hypothyroidism    Myopathy    Paralysis of tongue    Sick sinus syndrome ([place])     Past Surgical History:  Procedure Laterality Date   APPENDECTOMY     CAROTID ENDARTERECTOMY     CATARACT EXTRACTION, BILATERAL     CESAREAN SECTION     COLONOSCOPY     CORONARY ANGIOPLASTY     CORONARY STENT PLACEMENT     right coronary artery 1998   INGUINAL HERNIA REPAIR Right 02/25/2019   Procedure: RIGHT INGUINAL HERNIA REPAIR WITH MESH;  Surgeon: Jasmin Mesa, MD;  Location: [facility];  Service: General;  Laterality: Right;  LMA AND TAP BLOCK   MULTIPLE TOOTH EXTRACTIONS     SALIVARY GLAND SURGERY     removed 1998   SALPINGECTOMY     SKIN CANCER EXCISION     2005     Current  Outpatient Medications  Medication Sig Dispense Refill   aspirin EC 81 MG tablet Take 162 mg by mouth daily.     citalopram (CELEXA) 10 MG tablet Take 10 mg by mouth daily.     donepezil (ARICEPT) 10 MG tablet Take 10 mg by mouth in the morning.     ipratropium (ATROVENT) 0.06 % nasal spray 2 sprays 2 (two) times daily as needed (allergies).     levothyroxine (SYNTHROID) 88 MCG tablet Take 88 mcg by mouth every evening.     metoprolol succinate (TOPROL-XL) 25 MG 24 hr tablet TAKE 1 TABLET BY MOUTH EVERY DAY 90 tablet 1   rosuvastatin (CRESTOR) 40 MG tablet TAKE 1 TABLET BY MOUTH NIGHTLY AT BEDTIME 90 tablet 3   No current facility-administered  medications for this visit.    Allergies:   Shellfish allergy, Cozaar [losartan], and Pletaal [cilostazol]    ROS:  Please see the history of present illness.   Otherwise, review of systems are positive for none  All other systems are reviewed and negative.    PHYSICAL EXAM: VS:  BP (!) 126/52   Pulse 61   Ht '5\' 2"'$  (1.575 m)   Wt 97 lb 12.8 oz (44.4 kg)   SpO2 99%   BMI 17.89 kg/m  , BMI Body mass index is 17.89 kg/m.  GENERAL:  Well appearing NECK:  No jugular venous distention, waveform within normal limits, carotid upstroke brisk and symmetric, no bruits, no thyromegaly LUNGS:  Clear to auscultation bilaterally CHEST:  Unremarkable HEART:  PMI not displaced or sustained,S1 and S2 within normal limits, no S3, no S4, no clicks, no rubs, no murmurs ABD:  Flat, positive bowel sounds normal in frequency in pitch, no bruits, no rebound, no guarding, no midline pulsatile mass, no hepatomegaly, no splenomegaly EXT:  2 plus pulses throughout, no edema, no cyanosis no clubbing   EKG:  EKG is not ordered today.   Recent Labs: 01/23/2022: B Natriuretic Peptide 648.0; TSH 2.546 01/24/2022: ALT 15 01/26/2022: BUN 18; Creatinine, Ser 0.74; Hemoglobin 9.3; Magnesium 2.2; Platelets 319; Potassium 3.9; Sodium 138    Lipid Panel    Component Value Date/Time   CHOL 185 06/11/2018 1026   TRIG 77 06/11/2018 1026   HDL 88 06/11/2018 1026   CHOLHDL 2.1 06/11/2018 1026   CHOLHDL 2.0 09/01/2016 1226   VLDL 15 09/01/2016 1226   LDLCALC 82 06/11/2018 1026      Wt Readings from Last 3 Encounters:  05/29/22 97 lb 12.8 oz (44.4 kg)  03/14/22 104 lb (47.2 kg)  01/25/22 109 lb 2 oz (49.5 kg)      Other studies Reviewed: Additional studies/ records that were reviewed today include:   Labs Review of the above records demonstrates:  See above   ASSESSMENT AND PLAN:  CAD:   The patient has no new sypmtoms.  No further cardiovascular testing is indicated.  We will continue with aggressive  risk reduction and meds as listed.  PVD:   She has significant lower extremity vascular disease.  Her ABIs were unchanged from previous when she had them checked earlier this month.  No change in therapy.  HTN:  The blood pressure is at target.  No change in therapy.   AI:   This has been mild previously and I would suggest that it has not changed clinically.    AAA: She wants conservative management  CAROTID STENOSIS:     This was less than 60% bilateral and we will follow-up in 1  year.   Current medicines are reviewed at length with the patient today.  The patient does not have concerns regarding medicines.  The following changes have been made:     None    Labs/ tests ordered today include:    NA    No orders of the defined types were placed in this encounter.     Disposition:   FU with me in 12 months.   Signed, Jasmin Breeding, MD  05/29/2022 12:55 PM    [place]

## 2022-05-29 NOTE — Patient Instructions (Signed)
   Follow-Up: At [facility], you and your health needs are our priority.  As part of our continuing mission to provide you with exceptional heart care, we have created designated Provider Care Teams.  These Care Teams include your primary Cardiologist (physician) and Advanced Practice Providers (APPs -  Physician Assistants and Nurse Practitioners) who all work together to provide you with the care you need, when you need it.  We recommend signing up for the patient portal called "MyChart".  Sign up information is provided on this After Visit Summary.  MyChart is used to connect with patients for Virtual Visits (Telemedicine).  Patients are able to view lab/test results, encounter notes, upcoming appointments, etc.  Non-urgent messages can be sent to your provider as well.   To learn more about what you can do with MyChart, go to [email or web address].    Your next appointment:   12 month(s)  The format for your next appointment:   In Person  Provider:   James Hochrein, MD     

## 2022-07-09 NOTE — Progress Notes (Unsigned)
Cardiology Office Note   Date:  07/11/2022   ID:  Jasmin Crane, DOB 1932-03-07, MRN [id number]  PCP:  Hayden Rasmussen, MD  Cardiologist:   Minus Breeding, MD    Chief Complaint  Patient presents with   Weakness      History of Present Illness: Jasmin Crane is a 88 y.o. female who presents for evaluation of coronary artery disease. She was living in [place] and moved here with her daughter.  The first diagnosis of coronary disease was apparently in 1998. She had stenting of her right coronary artery that time. She's had a well preserved ejection fraction over the years. Her last echocardiogram in August of 2018 demonstrated an EF of 60%. She does have some aortic sclerosis with moderate aortic insufficiency.  She does have carotid stenosis with right 80% stenosis and left 15-49% stenosis. She has some moderately had normal ABIs left greater than right. She had a stress perfusion study in August 2017 that demonstrated moderate to high suspicion for ischemia. However, this was followed up with coronary CTA. She had a long calcification of the left main and proximal LAD but the severity of this could not be quantified secondary to motion artifact. There was moderate stenosis of the left circumflex. The right coronary artery could not be assessed secondary to artifact. It was decided however manage her medically.    Her most recent echo 02/2021 EF 55-60%, no RWMA, mild LVH, grade 2 diastolic dysfunction, RV SF mildly reduced, normal PASP, LA mildly dilated, trivial MR, mild AI, mild aortic sclerosis without stenosis.  Carotid duplex 05/2021 bilateral 40-59% stenosis.  She was admitted 5/29-01/27/22 with possible LLL viral PNA treated with IV abx, prednisone. She had recent fall with left calcaneal fracture/left fifth metatarsal fracture. Due to some LE edema, PCP ordered venous duplex 03/03/22 with complex cystic structure posterior to right common femoral vessels 2.6 x 1.8 x 1.7, known right  prox SFA occlusion, no evidence of SVT bilaterally.   Since I saw her she has had no new cardiovascular complaints.  However, she has had somewhat of a decline suddenly in her strength.  She has been working physical therapy after her fall and break but then it got better.  Now she is getting weaker and apparently is found to have some issues with her gallbladder.  Sounds like she is getting ready to have an ERCP.  However, it was thought that her carotid bruits were louder and so she was sent to see me.  She had known nonobstructive stenosis as above with imaging in September.  She has had no new neurologic complaints.  She denies any visual disturbance, motor disturbance.  She has had no new shortness of breath, PND or orthopnea.  Has had no new palpitations, presyncope or syncope.  She is weak  Past Medical History:  Diagnosis Date   Arrhythmia    Cancer ([place])    skin cancer 2005   Carotid artery occlusion    Claudication of lower extremity (HCC)    Coronary artery disease    Diverticulitis    Hyperlipidemia    Hypertension    Hypothyroidism    Myopathy    Paralysis of tongue    Sick sinus syndrome ([place])     Past Surgical History:  Procedure Laterality Date   APPENDECTOMY     CAROTID ENDARTERECTOMY     CATARACT EXTRACTION, BILATERAL     CESAREAN SECTION     COLONOSCOPY     CORONARY  ANGIOPLASTY     CORONARY STENT PLACEMENT     right coronary artery 1998   INGUINAL HERNIA REPAIR Right 02/25/2019   Procedure: RIGHT INGUINAL HERNIA REPAIR WITH MESH;  Surgeon: Donnie Mesa, MD;  Location: [facility];  Service: General;  Laterality: Right;  LMA AND TAP BLOCK   MULTIPLE TOOTH EXTRACTIONS     SALIVARY GLAND SURGERY     removed 1998   SALPINGECTOMY     SKIN CANCER EXCISION     2005     Current Outpatient Medications  Medication Sig Dispense Refill   aspirin EC 81 MG tablet Take 81 mg by mouth daily.     citalopram (CELEXA) 10 MG tablet Take 10 mg by mouth daily.     donepezil  (ARICEPT) 10 MG tablet Take 10 mg by mouth in the morning.     ipratropium (ATROVENT) 0.06 % nasal spray 2 sprays 2 (two) times daily as needed (allergies).     levothyroxine (SYNTHROID) 88 MCG tablet Take 88 mcg by mouth every evening.     metoprolol succinate (TOPROL-XL) 25 MG 24 hr tablet TAKE 1 TABLET BY MOUTH EVERY DAY 90 tablet 1   rosuvastatin (CRESTOR) 40 MG tablet TAKE 1 TABLET BY MOUTH NIGHTLY AT BEDTIME 90 tablet 3   No current facility-administered medications for this visit.    Allergies:   Shellfish allergy, Cozaar [losartan], and Pletaal [cilostazol]    ROS:  Please see the history of present illness.   Otherwise, review of systems are positive for none  All other systems are reviewed and negative.    PHYSICAL EXAM: VS:  BP 134/60   Pulse 62   Ht '5\' 2"'$  (1.575 m)   Wt 95 lb 9.6 oz (43.4 kg)   SpO2 96%   BMI 17.49 kg/m  , BMI Body mass index is 17.49 kg/m.  GENERAL:  Well appearing NECK:  No jugular venous distention, waveform within normal limits, carotid upstroke brisk and symmetric, no bruits, no thyromegaly LUNGS:  Clear to auscultation bilaterally CHEST:  Unremarkable HEART:  PMI not displaced or sustained,S1 and S2 within normal limits, no S3, no S4, no clicks, no rubs, bilateral carotid bruits murmurs ABD:  Flat, positive bowel sounds normal in frequency in pitch, no bruits, no rebound, no guarding, no midline pulsatile mass, no hepatomegaly, no splenomegaly EXT:  2 plus pulses throughout, no edema, no cyanosis no clubbing   EKG:  EKG is not ordered today. NA  Recent Labs: 01/23/2022: B Natriuretic Peptide 648.0; TSH 2.546 01/24/2022: ALT 15 01/26/2022: BUN 18; Creatinine, Ser 0.74; Hemoglobin 9.3; Magnesium 2.2; Platelets 319; Potassium 3.9; Sodium 138    Lipid Panel    Component Value Date/Time   CHOL 185 06/11/2018 1026   TRIG 77 06/11/2018 1026   HDL 88 06/11/2018 1026   CHOLHDL 2.1 06/11/2018 1026   CHOLHDL 2.0 09/01/2016 1226   VLDL 15  09/01/2016 1226   LDLCALC 82 06/11/2018 1026      Wt Readings from Last 3 Encounters:  07/11/22 95 lb 9.6 oz (43.4 kg)  05/29/22 97 lb 12.8 oz (44.4 kg)  03/14/22 104 lb (47.2 kg)      Other studies Reviewed: Additional studies/ records that were reviewed today include:   Review of previous vascular studies Review of the above records demonstrates: See elsewhere   ASSESSMENT AND PLAN:  CAD:   She has no new symptoms.  No change in therapy.  PVD:   She does have lower extremity vascular disease but no  resting symptoms or nonhealing ulcers.  We will been managing this with risk reduction.    HTN:  The blood pressure is at target.  No change in therapy.   AI:   This has been mild previously and I will follow this conservatively.  AAA:   We are going to pursue conservative management.  CAROTID STENOSIS:     This was less than 60% bilateral and we will follow-up April.  She has had no clinical suggestion that this has progressed.  She needs continued risk reduction.  Current medicines are reviewed at length with the patient today.  The patient does not have concerns regarding medicines.  The following changes have been made:     None    Labs/ tests ordered today include:    None    No orders of the defined types were placed in this encounter.   Disposition:   FU with me in Sept.   Signed, Minus Breeding, MD  07/11/2022 11:10 AM    [place]

## 2022-07-11 ENCOUNTER — Encounter: Payer: Self-pay | Admitting: Cardiology

## 2022-07-11 ENCOUNTER — Ambulatory Visit: Payer: Medicare Other | Attending: Cardiology | Admitting: Cardiology

## 2022-07-11 VITALS — BP 134/60 | HR 62 | Ht 62.0 in | Wt 95.6 lb

## 2022-07-11 DIAGNOSIS — I6523 Occlusion and stenosis of bilateral carotid arteries: Secondary | ICD-10-CM | POA: Insufficient documentation

## 2022-07-11 DIAGNOSIS — I2583 Coronary atherosclerosis due to lipid rich plaque: Secondary | ICD-10-CM | POA: Insufficient documentation

## 2022-07-11 DIAGNOSIS — I739 Peripheral vascular disease, unspecified: Secondary | ICD-10-CM | POA: Diagnosis not present

## 2022-07-11 DIAGNOSIS — I10 Essential (primary) hypertension: Secondary | ICD-10-CM | POA: Insufficient documentation

## 2022-07-11 DIAGNOSIS — I2510 Atherosclerotic heart disease of native coronary artery without angina pectoris: Secondary | ICD-10-CM | POA: Diagnosis not present

## 2022-07-11 DIAGNOSIS — I351 Nonrheumatic aortic (valve) insufficiency: Secondary | ICD-10-CM | POA: Diagnosis not present

## 2022-07-11 NOTE — Patient Instructions (Signed)
Medication Instructions:  Your physician recommends that you continue on your current medications as directed. Please refer to the Current Medication list given to you today.  *If you need a refill on your cardiac medications before your next appointment, please call your pharmacy*  Lab Work: NONE ordered at this time of appointment   If you have labs (blood work) drawn today and your tests are completely normal, you will receive your results only by: [place] (if you have MyChart) OR A paper copy in the mail If you have any lab test that is abnormal or we need to change your treatment, we will call you to review the results.  Testing/Procedures: Move previously ordered Carotid Ultrasound to March 2024  Follow-Up: At [facility], you and your health needs are our priority.  As part of our continuing mission to provide you with exceptional heart care, we have created designated Provider Care Teams.  These Care Teams include your primary Cardiologist (physician) and Advanced Practice Providers (APPs -  Physician Assistants and Nurse Practitioners) who all work together to provide you with the care you need, when you need it.  We recommend signing up for the patient portal called "MyChart".  Sign up information is provided on this After Visit Summary.  MyChart is used to connect with patients for Virtual Visits (Telemedicine).  Patients are able to view lab/test results, encounter notes, upcoming appointments, etc.  Non-urgent messages can be sent to your provider as well.   To learn more about what you can do with MyChart, go to [email or web address].    Your next appointment:   September 2024   The format for your next appointment:   In Person  Provider:   Minus Breeding, MD     Other Instructions   Important Information About Sugar

## 2022-07-12 ENCOUNTER — Encounter (HOSPITAL_COMMUNITY): Payer: Self-pay | Admitting: Gastroenterology

## 2022-07-19 NOTE — Anesthesia Preprocedure Evaluation (Addendum)
Anesthesia Evaluation  Patient identified by MRN, date of birth, ID band Patient awake    Reviewed: Allergy & Precautions, NPO status , Patient's Chart, lab work & pertinent test results  Airway Mallampati: II  TM Distance: >3 FB Neck ROM: Full    Dental no notable dental hx. (+) Edentulous Upper, Missing, Edentulous Lower,    Pulmonary COPD, former smoker   Pulmonary exam normal breath sounds clear to auscultation       Cardiovascular hypertension, Pt. on medications + CAD, + Cardiac Stents and + Peripheral Vascular Disease (S/P CEA)  Normal cardiovascular exam+ Valvular Problems/Murmurs AI  Rhythm:Regular Rate:Normal   1. Left ventricular ejection fraction, by estimation, is 55 to 60%. The  left ventricle has normal function. The left ventricle has no regional  wall motion abnormalities. There is mild left ventricular hypertrophy.  Left ventricular diastolic parameters  are consistent with Grade II diastolic dysfunction (pseudonormalization).   2. Right ventricular systolic function is mildly reduced. The right  ventricular size is normal. There is normal pulmonary artery systolic  pressure. The estimated right ventricular systolic pressure is 45.8 mmHg.   3. Left atrial size was mildly dilated.   4. The mitral valve is normal in structure. Trivial mitral valve  regurgitation. No evidence of mitral stenosis.   5. The aortic valve is tricuspid. Aortic valve regurgitation is mild.  Mild aortic valve sclerosis is present, with no evidence of aortic valve  stenosis. Aortic valve area, by VTI measures 1.91 cm. Aortic valve mean  gradient measures 10.0 mmHg.   6. The inferior vena cava is normal in size with greater than 50%  respiratory variability, suggesting right atrial pressure of 3 mmHg.      Neuro/Psych       Dementia    GI/Hepatic   Endo/Other  Hypothyroidism    Renal/GU K 3.7     Musculoskeletal   Abdominal    Peds  Hematology Hgb 14.6    Anesthesia Other Findings All: Cozaar, Pletal  Reproductive/Obstetrics                             Anesthesia Physical Anesthesia Plan  ASA: 4  Anesthesia Plan: General   Post-op Pain Management:    Induction: Intravenous  PONV Risk Score and Plan: 4 or greater and Treatment may vary due to age or medical condition and Ondansetron  Airway Management Planned: Oral ETT  Additional Equipment: None  Intra-op Plan:   Post-operative Plan:   Informed Consent: I have reviewed the patients History and Physical, chart, labs and discussed the procedure including the risks, benefits and alternatives for the proposed anesthesia with the patient or authorized representative who has indicated his/her understanding and acceptance.     Dental advisory given  Plan Discussed with:   Anesthesia Plan Comments: (ERCP for Cholangiocarcinoma)        Anesthesia Quick Evaluation

## 2022-07-21 ENCOUNTER — Other Ambulatory Visit: Payer: Self-pay

## 2022-07-21 ENCOUNTER — Ambulatory Visit (HOSPITAL_COMMUNITY)
Admission: RE | Admit: 2022-07-21 | Discharge: 2022-07-21 | Disposition: A | Discharge status: Home / Self Care | Payer: Medicare Other | Source: Ambulatory Visit | Attending: Gastroenterology | Admitting: Gastroenterology

## 2022-07-21 ENCOUNTER — Ambulatory Visit (HOSPITAL_COMMUNITY): Payer: Medicare Other | Admitting: Anesthesiology

## 2022-07-21 ENCOUNTER — Ambulatory Visit (HOSPITAL_BASED_OUTPATIENT_CLINIC_OR_DEPARTMENT_OTHER): Payer: Medicare Other | Admitting: Anesthesiology

## 2022-07-21 ENCOUNTER — Encounter (HOSPITAL_COMMUNITY)
Admission: RE | Disposition: A | Discharge status: Home / Self Care | Payer: Self-pay | Source: Ambulatory Visit | Attending: Gastroenterology

## 2022-07-21 ENCOUNTER — Ambulatory Visit (HOSPITAL_COMMUNITY): Payer: Medicare Other

## 2022-07-21 DIAGNOSIS — K862 Cyst of pancreas: Secondary | ICD-10-CM | POA: Insufficient documentation

## 2022-07-21 DIAGNOSIS — I10 Essential (primary) hypertension: Secondary | ICD-10-CM | POA: Diagnosis not present

## 2022-07-21 DIAGNOSIS — E039 Hypothyroidism, unspecified: Secondary | ICD-10-CM | POA: Diagnosis not present

## 2022-07-21 DIAGNOSIS — Z955 Presence of coronary angioplasty implant and graft: Secondary | ICD-10-CM | POA: Insufficient documentation

## 2022-07-21 DIAGNOSIS — R932 Abnormal findings on diagnostic imaging of liver and biliary tract: Secondary | ICD-10-CM | POA: Diagnosis present

## 2022-07-21 DIAGNOSIS — J449 Chronic obstructive pulmonary disease, unspecified: Secondary | ICD-10-CM

## 2022-07-21 DIAGNOSIS — K831 Obstruction of bile duct: Secondary | ICD-10-CM | POA: Diagnosis not present

## 2022-07-21 DIAGNOSIS — I2510 Atherosclerotic heart disease of native coronary artery without angina pectoris: Secondary | ICD-10-CM | POA: Insufficient documentation

## 2022-07-21 DIAGNOSIS — F0390 Unspecified dementia without behavioral disturbance: Secondary | ICD-10-CM | POA: Diagnosis not present

## 2022-07-21 DIAGNOSIS — Z87891 Personal history of nicotine dependence: Secondary | ICD-10-CM | POA: Insufficient documentation

## 2022-07-21 DIAGNOSIS — I739 Peripheral vascular disease, unspecified: Secondary | ICD-10-CM | POA: Diagnosis not present

## 2022-07-21 DIAGNOSIS — K838 Other specified diseases of biliary tract: Secondary | ICD-10-CM | POA: Diagnosis not present

## 2022-07-21 HISTORY — PX: ESOPHAGOGASTRODUODENOSCOPY (EGD) WITH PROPOFOL: SHX5813

## 2022-07-21 HISTORY — PX: EUS: SHX5427

## 2022-07-21 HISTORY — PX: BILIARY STENT PLACEMENT: SHX5538

## 2022-07-21 HISTORY — PX: BILIARY BRUSHING: SHX6843

## 2022-07-21 HISTORY — PX: ENDOSCOPIC RETROGRADE CHOLANGIOPANCREATOGRAPHY (ERCP) WITH PROPOFOL: SHX5810

## 2022-07-21 HISTORY — PX: SPHINCTEROTOMY: SHX5544

## 2022-07-21 SURGERY — ENDOSCOPIC RETROGRADE CHOLANGIOPANCREATOGRAPHY (ERCP) WITH PROPOFOL
Anesthesia: General

## 2022-07-21 SURGERY — UPPER ENDOSCOPIC ULTRASOUND (EUS) LINEAR
Anesthesia: General

## 2022-07-21 MED ORDER — PHENYLEPHRINE HCL (PRESSORS) 10 MG/ML IV SOLN
INTRAVENOUS | Status: AC
  Filled 2022-07-21: qty 1

## 2022-07-21 MED ORDER — FENTANYL CITRATE (PF) 100 MCG/2ML IJ SOLN
INTRAMUSCULAR | Status: AC
  Filled 2022-07-21: qty 2

## 2022-07-21 MED ORDER — DICLOFENAC SUPPOSITORY 100 MG
RECTAL | Status: AC
  Filled 2022-07-21: qty 1

## 2022-07-21 MED ORDER — DICLOFENAC SUPPOSITORY 100 MG
RECTAL | Status: DC | PRN
  Administered 2022-07-21: 100 mg via RECTAL

## 2022-07-21 MED ORDER — ROCURONIUM BROMIDE 10 MG/ML (PF) SYRINGE
PREFILLED_SYRINGE | INTRAVENOUS | Status: DC | PRN
  Administered 2022-07-21: 30 mg via INTRAVENOUS
  Administered 2022-07-21: 20 mg via INTRAVENOUS

## 2022-07-21 MED ORDER — LIDOCAINE 2% (20 MG/ML) 5 ML SYRINGE
INTRAMUSCULAR | Status: DC | PRN
  Administered 2022-07-21: 60 mg via INTRAVENOUS

## 2022-07-21 MED ORDER — CIPROFLOXACIN IN D5W 400 MG/200ML IV SOLN
400.0000 mg | Freq: Once | INTRAVENOUS | Status: AC
  Administered 2022-07-21: 400 mg via INTRAVENOUS

## 2022-07-21 MED ORDER — EPHEDRINE SULFATE-NACL 50-0.9 MG/10ML-% IV SOSY
PREFILLED_SYRINGE | INTRAVENOUS | Status: DC | PRN
  Administered 2022-07-21: 5 mg via INTRAVENOUS

## 2022-07-21 MED ORDER — ONDANSETRON HCL 4 MG/2ML IJ SOLN
INTRAMUSCULAR | Status: DC | PRN
  Administered 2022-07-21: 4 mg via INTRAVENOUS

## 2022-07-21 MED ORDER — GLUCAGON HCL RDNA (DIAGNOSTIC) 1 MG IJ SOLR
INTRAMUSCULAR | Status: AC
  Filled 2022-07-21: qty 1

## 2022-07-21 MED ORDER — FENTANYL CITRATE (PF) 250 MCG/5ML IJ SOLN
INTRAMUSCULAR | Status: DC | PRN
  Administered 2022-07-21: 25 ug via INTRAVENOUS
  Administered 2022-07-21: 50 ug via INTRAVENOUS

## 2022-07-21 MED ORDER — LACTATED RINGERS IV SOLN: INTRAVENOUS | Status: DC

## 2022-07-21 MED ORDER — DICLOFENAC SUPPOSITORY 100 MG
RECTAL | Status: AC
  Filled 2022-07-21: qty 2

## 2022-07-21 MED ORDER — SODIUM CHLORIDE 0.9 % IV SOLN
INTRAVENOUS | Status: DC | PRN
  Administered 2022-07-21: 15 mL

## 2022-07-21 MED ORDER — PHENYLEPHRINE HCL-NACL 20-0.9 MG/250ML-% IV SOLN
INTRAVENOUS | Status: DC | PRN
  Administered 2022-07-21: 50 ug/min via INTRAVENOUS

## 2022-07-21 MED ORDER — SODIUM CHLORIDE 0.9 % IV SOLN
INTRAVENOUS | Status: DC
Start: 2022-07-21 — End: 2022-07-21

## 2022-07-21 MED ORDER — SUGAMMADEX SODIUM 200 MG/2ML IV SOLN
INTRAVENOUS | Status: DC | PRN
  Administered 2022-07-21: 100 mg via INTRAVENOUS

## 2022-07-21 MED ORDER — PROPOFOL 10 MG/ML IV BOLUS
INTRAVENOUS | Status: DC | PRN
  Administered 2022-07-21: 60 mg via INTRAVENOUS

## 2022-07-21 MED ORDER — PROPOFOL 10 MG/ML IV BOLUS
INTRAVENOUS | Status: AC
  Filled 2022-07-21: qty 20

## 2022-07-21 MED ORDER — CIPROFLOXACIN IN D5W 400 MG/200ML IV SOLN
INTRAVENOUS | Status: AC
  Filled 2022-07-21: qty 200

## 2022-07-21 NOTE — Op Note (Signed)
[facility] Patient Name: Jasmin Crane Procedure Date: 07/21/2022 MRN: [id number] Attending MD: Carol Ada , MD, [id number] Date of Birth: 1931-10-31 CSN: [id number] Age: 88 Admit Type: Inpatient Procedure:                ERCP Indications:              Malignant stricture of the common bile duct,                            Malignant Bismuth type I stricture (limited to the                            common hepatic duct distal to the confluence of the                            right and left hepatic ducts) Providers:                Carol Ada, MD, Dulcy Fanny, William Dalton, Technician Referring MD:              Medicines:                General Anesthesia Complications:            No immediate complications. Estimated Blood Loss:     Estimated blood loss: none. Procedure:                Pre-Anesthesia Assessment:                           - Prior to the procedure, a History and Physical                            was performed, and patient medications and                            allergies were reviewed. The patient's tolerance of                            previous anesthesia was also reviewed. The risks                            and benefits of the procedure and the sedation                            options and risks were discussed with the patient.                            All questions were answered, and informed consent                            was obtained. Prior Anticoagulants: The patient has  taken no anticoagulant or antiplatelet agents. ASA                            Grade Assessment: III - A patient with severe                            systemic disease. After reviewing the risks and                            benefits, the patient was deemed in satisfactory                            condition to undergo the procedure.                           - Sedation was administered by an  anesthesia                            professional. General anesthesia was attained.                           After obtaining informed consent, the scope was                            passed under direct vision. Throughout the                            procedure, the patient's blood pressure, pulse, and                            oxygen saturations were monitored continuously. The                            [id number]-[id number] ([id number] Olympus duodenoscope was                            introduced through the mouth, and used to inject                            contrast into and used to inject contrast into the                            bile duct and dorsal pancreatic duct. The ERCP was                            accomplished without difficulty. Scope In: Scope Out: Findings:      The major papilla was normal. The bile duct was deeply cannulated with       the short-nosed traction sphincterotome. Contrast was injected. I       personally interpreted the bile duct images. There was brisk flow of       contrast through the ducts. Image quality was excellent. Contrast       extended to the entire biliary tree. The upper third of the main bile  duct and common hepatic duct contained a single segmental stenosis 30 mm       in length. The left and right hepatic ducts and all intrahepatic       branches were diffusely dilated, with a mass causing an obstruction. The       largest diameter was 7 mm. A long 0.035 inch Soft Jagwire was passed       into the biliary tree. A 10 mm biliary sphincterotomy was made with a       traction (standard) sphincterotome using ERBE electrocautery. There was       no post-sphincterotomy bleeding. One 7 Fr by 9 cm plastic stent with a       single external flap and a single internal flap was placed 6 cm into the       common bile duct. Bile flowed through the stent. The stent was in good       position.      Initial cannulation resulted in the guidewire going  into the PD. It was       left in place and a two wire technique was emplouyed. After several       attempts the CBD was cannulated. The guidewire was secured in the right       intrahepatic ducts. Contrast injection showed a 3 cm stricture at the       common hepatic ducts and the proximal CBD. There was some difficulty       with advancing the sphincterotome above the stricture, but it was       ultimately achieved. Two brushings were obtained of the malignant       stricture. A 7 Fr x 9 cm stent was inserted without difficulty.       Excellent drainage was achieved. Impression:               - The major papilla appeared normal.                           - A single segmental biliary stricture was found in                            the upper third of the main bile duct and common                            hepatic duct. The stricture was malignant appearing.                           - The left and right hepatic ducts and all                            intrahepatic branches were dilated, with a mass                            causing an obstruction.                           - A biliary sphincterotomy was performed.                           - One plastic stent was placed  into the common bile                            duct. Moderate Sedation:      Not Applicable - Patient had care per Anesthesia. Recommendation:           - Patient has a contact number available for                            emergencies. The signs and symptoms of potential                            delayed complications were discussed with the                            patient. Return to normal activities tomorrow.                            Written discharge instructions were provided to the                            patient.                           - Resume regular diet.                           - Await cytology results. Procedure Code(s):        --- Professional ---                           [phone],  Endoscopic retrograde                            cholangiopancreatography (ERCP); with placement of                            endoscopic stent into biliary or pancreatic duct,                            including pre- and post-dilation and guide wire                            passage, when performed, including sphincterotomy,                            when performed, each stent Diagnosis Code(s):        --- Professional ---                           K83.1, Obstruction of bile duct CPT copyright 2022 American Medical Association. All rights reserved. The codes documented in this report are preliminary and upon coder review may  be revised to meet current compliance requirements. Carol Ada, MD Carol Ada, MD 07/21/2022 9:47:01 AM This report has been signed electronically. Number of Addenda: 0

## 2022-07-21 NOTE — Anesthesia Postprocedure Evaluation (Signed)
Anesthesia Post Note  Patient: Jasmin Crane  Procedure(s) Performed: ENDOSCOPIC RETROGRADE CHOLANGIOPANCREATOGRAPHY (ERCP) WITH PROPOFOL UPPER ENDOSCOPIC ULTRASOUND (EUS) LINEAR BILIARY BRUSHING BILIARY STENT PLACEMENT SPHINCTEROTOMY     Patient location during evaluation: Endoscopy Anesthesia Type: General Level of consciousness: awake and alert Pain management: pain level controlled Vital Signs Assessment: post-procedure vital signs reviewed and stable Respiratory status: spontaneous breathing, nonlabored ventilation, respiratory function stable and patient connected to nasal cannula oxygen Cardiovascular status: blood pressure returned to baseline and stable Postop Assessment: no apparent nausea or vomiting Anesthetic complications: no  No notable events documented.  Last Vitals:  Vitals:   07/21/22 1000 07/21/22 1010  BP: (!) 111/45 (!) 129/49  Pulse: 74 70  Resp: 19 (!) 21  Temp:    SpO2: 93% 92%    Last Pain:  Vitals:   07/21/22 0950  TempSrc:   PainSc: 0-No pain                 Barnet Glasgow

## 2022-07-21 NOTE — Transfer of Care (Signed)
Immediate Anesthesia Transfer of Care Note  Patient: Jasmin Crane  Procedure(s) Performed: ENDOSCOPIC RETROGRADE CHOLANGIOPANCREATOGRAPHY (ERCP) WITH PROPOFOL UPPER ENDOSCOPIC ULTRASOUND (EUS) LINEAR BILIARY BRUSHING BILIARY STENT PLACEMENT SPHINCTEROTOMY  Patient Location: PACU and Endoscopy Unit  Anesthesia Type:General  Level of Consciousness: awake and drowsy  Airway & Oxygen Therapy: Patient Spontanous Breathing and Patient connected to face mask oxygen  Post-op Assessment: Report given to RN and Post -op Vital signs reviewed and stable  Post vital signs: Reviewed and stable  Last Vitals:  Vitals Value Taken Time  BP 176/65 07/21/22 0937  Temp 36.1 C 07/21/22 0937  Pulse 74 07/21/22 0938  Resp 20 07/21/22 0938  SpO2 100 % 07/21/22 0938  Vitals shown include unvalidated device data.  Last Pain:  Vitals:   07/21/22 0937  TempSrc: Temporal  PainSc:          Complications: No notable events documented.

## 2022-07-21 NOTE — Anesthesia Procedure Notes (Signed)
Procedure Name: Intubation Date/Time: 07/21/2022 8:03 AM  Performed by: Lollie Sails, CRNAPre-anesthesia Checklist: Patient identified, Emergency Drugs available, Suction available, Patient being monitored and Timeout performed Patient Re-evaluated:Patient Re-evaluated prior to induction Oxygen Delivery Method: Circle system utilized Preoxygenation: Pre-oxygenation with 100% oxygen Induction Type: IV induction Ventilation: Mask ventilation without difficulty Laryngoscope Size: Miller and 3 Grade View: Grade I Tube type: Oral Tube size: 7.0 mm Number of attempts: 1 Airway Equipment and Method: Stylet Placement Confirmation: ETT inserted through vocal cords under direct vision, positive ETCO2 and breath sounds checked- equal and bilateral Secured at: 22 cm Tube secured with: Tape Dental Injury: Teeth and Oropharynx as per pre-operative assessment

## 2022-07-21 NOTE — Discharge Instructions (Signed)

## 2022-07-21 NOTE — Op Note (Signed)
[facility] Patient Name: Jasmin Crane Procedure Date: 07/21/2022 MRN: [id number] Attending MD: Carol Ada , MD, [id number] Date of Birth: Oct 14, 1931 CSN: [id number] Age: 88 Admit Type: Inpatient Procedure:                Upper EUS Indications:              Suspected mass in liver on CT scan Providers:                Carol Ada, MD, Dulcy Fanny, William Dalton, Technician Referring MD:              Medicines:                General Anesthesia Complications:            No immediate complications. Estimated Blood Loss:     Estimated blood loss: none. Procedure:                Pre-Anesthesia Assessment:                           - Prior to the procedure, a History and Physical                            was performed, and patient medications and                            allergies were reviewed. The patient's tolerance of                            previous anesthesia was also reviewed. The risks                            and benefits of the procedure and the sedation                            options and risks were discussed with the patient.                            All questions were answered, and informed consent                            was obtained. Prior Anticoagulants: The patient has                            taken no anticoagulant or antiplatelet agents. ASA                            Grade Assessment: III - A patient with severe                            systemic disease. After reviewing the risks and  benefits, the patient was deemed in satisfactory                            condition to undergo the procedure.                           - Sedation was administered by an anesthesia                            professional. General anesthesia was attained.                           After obtaining informed consent, the endoscope was                            passed under direct vision.  Throughout the                            procedure, the patient's blood pressure, pulse, and                            oxygen saturations were monitored continuously. The                            [id number] ([id number] Olympus linear ultrasound scope                            was introduced through the mouth, and advanced to                            the second part of duodenum. The upper EUS was                            accomplished without difficulty. The patient                            tolerated the procedure well. Scope In: Scope Out: Findings:      ENDOSONOGRAPHIC FINDING: :      There was dilation diffusely throughout the intrahepatic bile duct(s)       which measured up to 10 mm.      An anechoic lesion suggestive of a cyst was identified in the pancreatic       head. It is not in obvious communication with the pancreatic duct. The       lesion measured 10 mm by 7 mm in maximal cross-sectional diameter. There       was a single compartment without septae. The outer wall of the lesion       was not seen. There was no associated mass. There was no internal debris       within the fluid-filled cavity.      There was marked intrahepatic biliary ductal dilation. There was no       clear evidence of a mass with the EUS, but in the proximal portion of       the CBD there was a distinct narrowing. Impression:               -  There was dilation in the intrahepatic bile                            ducts, diffusely which measured up to 10 mm.                           - A cystic lesion was seen in the pancreatic head.                           - No specimens collected. Moderate Sedation:      Not Applicable - Patient had care per Anesthesia. Recommendation:           - Proceed with the ERCP. Procedure Code(s):        --- Professional ---                           [phone], Esophagogastroduodenoscopy, flexible,                            transoral; with endoscopic ultrasound  examination                            limited to the esophagus, stomach or duodenum, and                            adjacent structures Diagnosis Code(s):        --- Professional ---                           K83.8, Other specified diseases of biliary tract                           K86.2, Cyst of pancreas                           R93.2, Abnormal findings on diagnostic imaging of                            liver and biliary tract CPT copyright 2022 American Medical Association. All rights reserved. The codes documented in this report are preliminary and upon coder review may  be revised to meet current compliance requirements. Carol Ada, MD Carol Ada, MD 07/21/2022 9:52:29 AM This report has been signed electronically. Number of Addenda: 0

## 2022-07-21 NOTE — H&P (Signed)
Jasmin Crane HPI: Recently the patient was experiencing a "spell".  She noticed that her stamina was lower and she presented to Dr. Darron Doom for further evaluatiuon.  Blood work showed that her liver enzymes were elevated, which lead to an ultrasound and then a CT scan.  The CT scan shows intrahepatic biliary ductal dilation and a soft tissue mass in the hilar region.  The values for the elevated liver enzymes on 07/08/2022 were as follows:  AST 93, ALT 134, AP 981, and TB 0.5.  Past Medical History:  Diagnosis Date   Arrhythmia    Cancer [facility])    skin cancer 2005   Carotid artery occlusion    Claudication of lower extremity (HCC)    Complication of anesthesia    one bad experience 30-40  yrs ago, since had procedures went okay   Coronary artery disease    Diverticulitis    Hyperlipidemia    Hypertension    Hypothyroidism    Myopathy    Paralysis of tongue    Sick sinus syndrome (HCC)     Past Surgical History:  Procedure Laterality Date   APPENDECTOMY     CAROTID ENDARTERECTOMY     CATARACT EXTRACTION, BILATERAL     CESAREAN SECTION     COLONOSCOPY     CORONARY ANGIOPLASTY     CORONARY STENT PLACEMENT     right coronary artery 1998   INGUINAL HERNIA REPAIR Right 02/25/2019   Procedure: RIGHT INGUINAL HERNIA REPAIR WITH MESH;  Surgeon: Donnie Mesa, MD;  Location: [place];  Service: General;  Laterality: Right;  LMA AND TAP BLOCK   MULTIPLE TOOTH EXTRACTIONS     SALIVARY GLAND SURGERY     removed 1998   SALPINGECTOMY     SKIN CANCER EXCISION     2005    Family History  Problem Relation Age of Onset   Cancer Mother    Stroke Mother    Heart disease Father 25       CAD   Heart failure Sister    Arthritis Brother    Lung cancer Brother    Pancreatic cancer Brother    Heart attack Son 93    Social History:  reports that she has quit smoking. Her smoking use included cigarettes. She has never used smokeless tobacco. She reports current alcohol use. She reports that  she does not use drugs.  Allergies:  Allergies  Allergen Reactions   Other Anaphylaxis and Swelling    Scallops   Cozaar [Losartan] Nausea Only   Pletaal [Cilostazol] Palpitations    Medications: Scheduled: Continuous:  No results found for this or any previous visit (from the past 24 hour(s)).   No results found.  ROS:  As stated above in the HPI otherwise negative.  There were no vitals taken for this visit.    PE: Gen: NAD, Alert and Oriented HEENT:  [place]/AT, EOMI Neck: Supple, no LAD Lungs: CTA Bilaterally CV: RRR without M/G/R ABD: Soft, NTND, +BS Ext: No C/C/E  Assessment/Plan: 1) Bile duct obstruction - EUS with possible FNA and ERCP.  [name] D 07/21/2022, 7:09 AM

## 2022-07-24 ENCOUNTER — Encounter (HOSPITAL_COMMUNITY): Payer: Self-pay | Admitting: Gastroenterology

## 2022-07-24 NOTE — Progress Notes (Signed)
Attempted to reach pt but unable to leave message/ bt

## 2022-07-25 LAB — CYTOLOGY - NON PAP

## 2022-07-26 ENCOUNTER — Encounter: Payer: Self-pay | Admitting: *Deleted

## 2022-07-26 ENCOUNTER — Other Ambulatory Visit: Payer: Self-pay

## 2022-07-26 DIAGNOSIS — K831 Obstruction of bile duct: Secondary | ICD-10-CM

## 2022-07-26 NOTE — Progress Notes (Signed)
I received an in basket message from Dr Benson Norway requesting oncology consultation.  I spoke with Ms Faircloth and her daughter. They prefer to be seen at our [place] location as they live closer.  Royston Sinner notified and referral order placed.

## 2022-07-26 NOTE — Progress Notes (Signed)
PATIENT NAVIGATOR PROGRESS NOTE  Name: Liylah Najarro Date: 07/26/2022 MRN: [id number]  DOB: 1932/02/10   Reason for visit:  Introductory phone call and new pt appt   Comments:  Called and spoke with daughter [name] and scheduled pt with Dr Benay Spice for 08/09/22 at 1:10 pm. Directions to building and parking reviewed as well as one support person allowed in appt Contact information given    Time spent counseling/coordinating care: 30-45 minutes

## 2022-07-29 ENCOUNTER — Other Ambulatory Visit: Payer: Self-pay | Admitting: Cardiology

## 2022-08-02 ENCOUNTER — Emergency Department (HOSPITAL_COMMUNITY): Payer: Medicare Other

## 2022-08-02 ENCOUNTER — Encounter (HOSPITAL_COMMUNITY): Payer: Self-pay

## 2022-08-02 ENCOUNTER — Inpatient Hospital Stay (HOSPITAL_COMMUNITY)
Admission: EM | Admit: 2022-08-02 | Discharge: 2022-08-05 | DRG: 435 | Disposition: A | Discharge status: Home Health | Payer: Medicare Other | Attending: Internal Medicine | Admitting: Internal Medicine

## 2022-08-02 ENCOUNTER — Other Ambulatory Visit: Payer: Self-pay

## 2022-08-02 DIAGNOSIS — I2510 Atherosclerotic heart disease of native coronary artery without angina pectoris: Secondary | ICD-10-CM | POA: Diagnosis present

## 2022-08-02 DIAGNOSIS — R54 Age-related physical debility: Secondary | ICD-10-CM | POA: Diagnosis present

## 2022-08-02 DIAGNOSIS — Z823 Family history of stroke: Secondary | ICD-10-CM

## 2022-08-02 DIAGNOSIS — Z800 Family history of malignant neoplasm of digestive organs: Secondary | ICD-10-CM

## 2022-08-02 DIAGNOSIS — Z85828 Personal history of other malignant neoplasm of skin: Secondary | ICD-10-CM

## 2022-08-02 DIAGNOSIS — Z87891 Personal history of nicotine dependence: Secondary | ICD-10-CM

## 2022-08-02 DIAGNOSIS — I7143 Infrarenal abdominal aortic aneurysm, without rupture: Secondary | ICD-10-CM | POA: Diagnosis present

## 2022-08-02 DIAGNOSIS — K831 Obstruction of bile duct: Secondary | ICD-10-CM | POA: Diagnosis present

## 2022-08-02 DIAGNOSIS — Z66 Do not resuscitate: Secondary | ICD-10-CM | POA: Diagnosis present

## 2022-08-02 DIAGNOSIS — I739 Peripheral vascular disease, unspecified: Secondary | ICD-10-CM | POA: Diagnosis present

## 2022-08-02 DIAGNOSIS — Z79890 Hormone replacement therapy (postmenopausal): Secondary | ICD-10-CM

## 2022-08-02 DIAGNOSIS — G309 Alzheimer's disease, unspecified: Secondary | ICD-10-CM | POA: Diagnosis present

## 2022-08-02 DIAGNOSIS — Z79899 Other long term (current) drug therapy: Secondary | ICD-10-CM

## 2022-08-02 DIAGNOSIS — Z91013 Allergy to seafood: Secondary | ICD-10-CM

## 2022-08-02 DIAGNOSIS — I495 Sick sinus syndrome: Secondary | ICD-10-CM | POA: Diagnosis present

## 2022-08-02 DIAGNOSIS — K5900 Constipation, unspecified: Secondary | ICD-10-CM | POA: Diagnosis present

## 2022-08-02 DIAGNOSIS — Z888 Allergy status to other drugs, medicaments and biological substances status: Secondary | ICD-10-CM

## 2022-08-02 DIAGNOSIS — I110 Hypertensive heart disease with heart failure: Secondary | ICD-10-CM | POA: Diagnosis present

## 2022-08-02 DIAGNOSIS — B9620 Unspecified Escherichia coli [E. coli] as the cause of diseases classified elsewhere: Secondary | ICD-10-CM | POA: Diagnosis present

## 2022-08-02 DIAGNOSIS — E785 Hyperlipidemia, unspecified: Secondary | ICD-10-CM | POA: Diagnosis present

## 2022-08-02 DIAGNOSIS — C221 Intrahepatic bile duct carcinoma: Secondary | ICD-10-CM | POA: Diagnosis not present

## 2022-08-02 DIAGNOSIS — Z955 Presence of coronary angioplasty implant and graft: Secondary | ICD-10-CM

## 2022-08-02 DIAGNOSIS — I2583 Coronary atherosclerosis due to lipid rich plaque: Secondary | ICD-10-CM | POA: Diagnosis present

## 2022-08-02 DIAGNOSIS — K5909 Other constipation: Secondary | ICD-10-CM

## 2022-08-02 DIAGNOSIS — E039 Hypothyroidism, unspecified: Secondary | ICD-10-CM | POA: Diagnosis present

## 2022-08-02 DIAGNOSIS — R7881 Bacteremia: Secondary | ICD-10-CM | POA: Diagnosis present

## 2022-08-02 DIAGNOSIS — F0280 Dementia in other diseases classified elsewhere without behavioral disturbance: Secondary | ICD-10-CM | POA: Diagnosis present

## 2022-08-02 DIAGNOSIS — I5032 Chronic diastolic (congestive) heart failure: Secondary | ICD-10-CM | POA: Diagnosis present

## 2022-08-02 DIAGNOSIS — Z8261 Family history of arthritis: Secondary | ICD-10-CM

## 2022-08-02 DIAGNOSIS — H9190 Unspecified hearing loss, unspecified ear: Secondary | ICD-10-CM | POA: Diagnosis present

## 2022-08-02 DIAGNOSIS — Z801 Family history of malignant neoplasm of trachea, bronchus and lung: Secondary | ICD-10-CM

## 2022-08-02 DIAGNOSIS — Z8249 Family history of ischemic heart disease and other diseases of the circulatory system: Secondary | ICD-10-CM

## 2022-08-02 DIAGNOSIS — Z1152 Encounter for screening for COVID-19: Secondary | ICD-10-CM

## 2022-08-02 DIAGNOSIS — Z7982 Long term (current) use of aspirin: Secondary | ICD-10-CM

## 2022-08-02 DIAGNOSIS — B961 Klebsiella pneumoniae [K. pneumoniae] as the cause of diseases classified elsewhere: Secondary | ICD-10-CM | POA: Diagnosis present

## 2022-08-02 DIAGNOSIS — I10 Essential (primary) hypertension: Secondary | ICD-10-CM | POA: Diagnosis present

## 2022-08-02 DIAGNOSIS — R1031 Right lower quadrant pain: Principal | ICD-10-CM

## 2022-08-02 DIAGNOSIS — I700 Atherosclerosis of aorta: Secondary | ICD-10-CM | POA: Diagnosis present

## 2022-08-02 DIAGNOSIS — K838 Other specified diseases of biliary tract: Secondary | ICD-10-CM

## 2022-08-02 LAB — COMPREHENSIVE METABOLIC PANEL
ALT: 48 U/L — ABNORMAL HIGH (ref 0–44)
AST: 102 U/L — ABNORMAL HIGH (ref 15–41)
Albumin: 3.2 g/dL — ABNORMAL LOW (ref 3.5–5.0)
Alkaline Phosphatase: 384 U/L — ABNORMAL HIGH (ref 38–126)
Anion gap: 6 (ref 5–15)
BUN: 20 mg/dL (ref 8–23)
CO2: 25 mmol/L (ref 22–32)
Calcium: 8.9 mg/dL (ref 8.9–10.3)
Chloride: 106 mmol/L (ref 98–111)
Creatinine, Ser: 0.99 mg/dL (ref 0.44–1.00)
GFR, Estimated: 55 mL/min — ABNORMAL LOW (ref >60)
Glucose, Bld: 194 mg/dL — ABNORMAL HIGH (ref 70–99)
Potassium: 4 mmol/L (ref 3.5–5.1)
Sodium: 137 mmol/L (ref 135–145)
Total Bilirubin: 0.8 mg/dL (ref 0.3–1.2)
Total Protein: 6.5 g/dL (ref 6.5–8.1)

## 2022-08-02 LAB — URINALYSIS, ROUTINE W REFLEX MICROSCOPIC
Bilirubin Urine: NEGATIVE
Glucose, UA: 50 mg/dL — AB
Hgb urine dipstick: NEGATIVE
Ketones, ur: NEGATIVE mg/dL
Leukocytes,Ua: NEGATIVE
Nitrite: NEGATIVE
Protein, ur: NEGATIVE mg/dL
Specific Gravity, Urine: 1.016 (ref 1.005–1.030)
pH: 7 (ref 5.0–8.0)

## 2022-08-02 LAB — CBC WITH DIFFERENTIAL/PLATELET
Abs Immature Granulocytes: 0.02 10*3/uL (ref 0.00–0.07)
Basophils Absolute: 0.1 10*3/uL (ref 0.0–0.1)
Basophils Relative: 1 %
Eosinophils Absolute: 0 10*3/uL (ref 0.0–0.5)
Eosinophils Relative: 0 %
HCT: 35.9 % — ABNORMAL LOW (ref 36.0–46.0)
Hemoglobin: 10.9 g/dL — ABNORMAL LOW (ref 12.0–15.0)
Immature Granulocytes: 0 %
Lymphocytes Relative: 3 %
Lymphs Abs: 0.4 10*3/uL — ABNORMAL LOW (ref 0.7–4.0)
MCH: 26.8 pg (ref 26.0–34.0)
MCHC: 30.4 g/dL (ref 30.0–36.0)
MCV: 88.4 fL (ref 80.0–100.0)
Monocytes Absolute: 0.7 10*3/uL (ref 0.1–1.0)
Monocytes Relative: 7 %
Neutro Abs: 9.7 10*3/uL — ABNORMAL HIGH (ref 1.7–7.7)
Neutrophils Relative %: 89 %
Platelets: 132 10*3/uL — ABNORMAL LOW (ref 150–400)
RBC: 4.06 MIL/uL (ref 3.87–5.11)
RDW: 18.7 % — ABNORMAL HIGH (ref 11.5–15.5)
WBC: 10.8 10*3/uL — ABNORMAL HIGH (ref 4.0–10.5)
nRBC: 0 % (ref 0.0–0.2)

## 2022-08-02 LAB — LACTIC ACID, PLASMA
Lactic Acid, Venous: 2 mmol/L (ref 0.5–1.9)
Lactic Acid, Venous: 1.5 mmol/L (ref 0.5–1.9)

## 2022-08-02 LAB — RESP PANEL BY RT-PCR (FLU A&B, COVID) ARPGX2
Influenza A by PCR: NEGATIVE
Influenza B by PCR: NEGATIVE
SARS Coronavirus 2 by RT PCR: NEGATIVE

## 2022-08-02 LAB — MAGNESIUM: Magnesium: 1.8 mg/dL (ref 1.7–2.4)

## 2022-08-02 LAB — LIPASE, BLOOD: Lipase: 63 U/L — ABNORMAL HIGH (ref 11–51)

## 2022-08-02 MED ORDER — LIDOCAINE 5 % EX PTCH
1.0000 | MEDICATED_PATCH | CUTANEOUS | Status: DC
  Administered 2022-08-02 – 2022-08-04 (×3): 1 via TRANSDERMAL
  Filled 2022-08-02 (×5): qty 1

## 2022-08-02 MED ORDER — ASPIRIN 81 MG PO TBEC
81.0000 mg | DELAYED_RELEASE_TABLET | Freq: Every day | ORAL | Status: DC
  Administered 2022-08-03 – 2022-08-05 (×3): 81 mg via ORAL
  Filled 2022-08-02 (×3): qty 1

## 2022-08-02 MED ORDER — DONEPEZIL HCL 10 MG PO TABS
10.0000 mg | ORAL_TABLET | Freq: Every day | ORAL | Status: DC
  Administered 2022-08-03 – 2022-08-05 (×3): 10 mg via ORAL
  Filled 2022-08-02 (×3): qty 1

## 2022-08-02 MED ORDER — CITALOPRAM HYDROBROMIDE 20 MG PO TABS
10.0000 mg | ORAL_TABLET | Freq: Every day | ORAL | Status: DC
  Administered 2022-08-03 – 2022-08-05 (×3): 10 mg via ORAL
  Filled 2022-08-02 (×3): qty 1

## 2022-08-02 MED ORDER — LACTATED RINGERS IV SOLN: INTRAVENOUS | Status: AC

## 2022-08-02 MED ORDER — IOHEXOL 300 MG/ML  SOLN
100.0000 mL | Freq: Once | INTRAMUSCULAR | Status: AC | PRN
  Administered 2022-08-02: 100 mL via INTRAVENOUS

## 2022-08-02 MED ORDER — LEVOTHYROXINE SODIUM 88 MCG PO TABS
88.0000 ug | ORAL_TABLET | Freq: Every day | ORAL | Status: DC
  Administered 2022-08-03 – 2022-08-04 (×3): 88 ug via ORAL
  Filled 2022-08-02 (×3): qty 1

## 2022-08-02 MED ORDER — ENOXAPARIN SODIUM 40 MG/0.4ML IJ SOSY: 40.0000 mg | PREFILLED_SYRINGE | INTRAMUSCULAR | Status: DC

## 2022-08-02 MED ORDER — ONDANSETRON HCL 4 MG/2ML IJ SOLN: 4.0000 mg | Freq: Four times a day (QID) | INTRAMUSCULAR | Status: DC | PRN

## 2022-08-02 MED ORDER — SENNOSIDES-DOCUSATE SODIUM 8.6-50 MG PO TABS: 1.0000 | ORAL_TABLET | Freq: Every evening | ORAL | Status: DC | PRN

## 2022-08-02 MED ORDER — ONDANSETRON HCL 4 MG PO TABS: 4.0000 mg | ORAL_TABLET | Freq: Four times a day (QID) | ORAL | Status: DC | PRN

## 2022-08-02 MED ORDER — LACTATED RINGERS IV BOLUS
1000.0000 mL | Freq: Once | INTRAVENOUS | Status: AC
  Administered 2022-08-02: 1000 mL via INTRAVENOUS

## 2022-08-02 MED ORDER — METOPROLOL SUCCINATE ER 25 MG PO TB24
25.0000 mg | ORAL_TABLET | Freq: Every day | ORAL | Status: DC
  Administered 2022-08-03 – 2022-08-05 (×3): 25 mg via ORAL
  Filled 2022-08-02 (×3): qty 1

## 2022-08-02 NOTE — Assessment & Plan Note (Signed)
Continue Synthroid °

## 2022-08-02 NOTE — ED Triage Notes (Signed)
Pt BIB [facility] from [facility] for malaise, had tooth extraction 4 days ago

## 2022-08-02 NOTE — ED Notes (Signed)
Patient transported to CT 

## 2022-08-02 NOTE — Assessment & Plan Note (Signed)
Holding statin for now.

## 2022-08-02 NOTE — Assessment & Plan Note (Signed)
Continue Toprol-XL. 

## 2022-08-02 NOTE — Assessment & Plan Note (Signed)
S/p recent ERCP s/p sphincterotomy and biliary stent placement.  Findings were concerning for malignant appearing biliary stricture and obstructing mass.  Cytology unrevealing.  Has appointment with oncology on 12/8.  CT A/P this admission concerning for biliary stent malfunction. -GI, Dr. Benson Norway to see in a.m. -Will keep n.p.o. after midnight -Gentle IV fluid hydration overnight

## 2022-08-02 NOTE — ED Provider Notes (Signed)
[facility] DEPT Provider Note   CSN: [id number] Arrival date & time: 08/02/22  1637     History  Chief Complaint  Patient presents with   Generalized Body Aches    Jasmin Crane is a 88 y.o. female. With past medical history of CAD, HTN, AAA, diastolic HF, Alzheimer who presents to the emergency department with fever.  Level 5 Caveat: dementia History is provided by the daughter who I called. She states the today around 2p, the patient's companion caregiver went to check on her and she was in bed and rigorous and unable to get up. States she saw her at lunch and patient was doing just fine. She states that 10 days ago she had an ERCP for biliary obstruction that is presumed cancerous and has oncology follow-up appointment on Friday. Additionally, 8 days ago had 2 teeth removed without complication. She denies seeing her cough. Denies her having vomiting or diarrhea. She states she has consistent abdominal pain.   HPI     Home Medications Prior to Admission medications   Medication Sig Start Date End Date Taking? Authorizing Provider  aspirin EC 81 MG tablet Take 81 mg by mouth daily.    [provider]  cholecalciferol (VITAMIN D3) 25 MCG (1000 UNIT) tablet Take 1,000 Units by mouth daily.    [provider]  citalopram (CELEXA) 10 MG tablet Take 10 mg by mouth daily. 12/27/20   [provider]  docusate sodium (COLACE) 100 MG capsule Take 100 mg by mouth daily as needed for mild constipation.    [provider]  donepezil (ARICEPT) 10 MG tablet Take 10 mg by mouth in the morning. 01/17/21   [provider]  ibuprofen (ADVIL) 200 MG tablet Take 200-400 mg by mouth every 6 (six) hours as needed for moderate pain.    [provider]  levothyroxine (SYNTHROID) 88 MCG tablet Take 88 mcg by mouth every evening. 05/26/21   [provider]  metoprolol succinate (TOPROL-XL) 25 MG 24 hr tablet TAKE 1  TABLET BY MOUTH EVERY DAY 07/31/22   Minus Breeding, MD  polyethylene glycol (MIRALAX / GLYCOLAX) 17 g packet Take 17 g by mouth daily as needed for moderate constipation.    [provider]  rosuvastatin (CRESTOR) 40 MG tablet TAKE 1 TABLET BY MOUTH NIGHTLY AT BEDTIME 08/10/21   Minus Breeding, MD      Allergies    Other, Cozaar [losartan], and Pletaal [cilostazol]    Review of Systems   Review of Systems  Unable to perform ROS: Dementia    Physical Exam Updated Vital Signs BP (!) 117/50   Pulse 84   Temp 99.3 F (37.4 C) (Oral)   Resp 17   Ht '5\' 2"'$  (1.575 m)   Wt 43.4 kg   SpO2 95%   BMI 17.50 kg/m  Physical Exam Vitals and nursing note reviewed.  Constitutional:      General: She is not in acute distress.    Appearance: Normal appearance. She is normal weight. She is not toxic-appearing.  HENT:     Head: Normocephalic.     Mouth/Throat:     Mouth: Mucous membranes are dry.     Pharynx: Oropharynx is clear.  Eyes:     General: No scleral icterus.    Extraocular Movements: Extraocular movements intact.  Cardiovascular:     Rate and Rhythm: Normal rate and regular rhythm.     Pulses: Normal pulses.     Heart sounds: No  murmur heard. Pulmonary:     Effort: Pulmonary effort is normal. No tachypnea or respiratory distress.     Breath sounds: Examination of the right-lower field reveals decreased breath sounds. Decreased breath sounds present.  Abdominal:     General: Bowel sounds are normal. There is no distension.     Palpations: Abdomen is soft.     Tenderness: There is abdominal tenderness in the right upper quadrant. There is no guarding or rebound.  Musculoskeletal:        General: Normal range of motion.     Cervical back: Neck supple.  Skin:    General: Skin is warm and dry.     Capillary Refill: Capillary refill takes less than 2 seconds.     Comments: No ulcers or wounds   Neurological:     General: No focal deficit present.     Mental Status:  She is alert. Mental status is at baseline.  Psychiatric:        Mood and Affect: Mood normal.        Behavior: Behavior normal.     ED Results / Procedures / Treatments   Labs (all labs ordered are listed, but only abnormal results are displayed) Labs Reviewed  CBC WITH DIFFERENTIAL/PLATELET - Abnormal; Notable for the following components:      Result Value   WBC 10.8 (*)    Hemoglobin 10.9 (*)    HCT 35.9 (*)    RDW 18.7 (*)    Platelets 132 (*)    Neutro Abs 9.7 (*)    Lymphs Abs 0.4 (*)    All other components within normal limits  CULTURE, BLOOD (ROUTINE X 2)  CULTURE, BLOOD (ROUTINE X 2)  RESP PANEL BY RT-PCR (FLU A&B, COVID) [id number]  COMPREHENSIVE METABOLIC PANEL  LACTIC ACID, PLASMA  LACTIC ACID, PLASMA  URINALYSIS, ROUTINE W REFLEX MICROSCOPIC  MAGNESIUM  LIPASE, BLOOD    EKG EKG Interpretation  Date/Time:  Wednesday August 02 2022 17:25:33 EST Ventricular Rate:  77 PR Interval:  46 QRS Duration: 99 QT Interval:  291 QTC Calculation: 308 R Axis:   62 Text Interpretation: Sinus rhythm Short PR interval Minimal ST elevation, lateral leads Artifact in lead(s) I III aVR aVL aVF No significant change since last tracing Confirmed by [place] ([id number]) on 08/02/2022 5:34:12 PM  Radiology DG Chest Port 1 View  Result Date: 08/02/2022 CLINICAL DATA:  Fever. EXAM: PORTABLE CHEST 1 VIEW COMPARISON:  Jan 23, 2022. FINDINGS: The heart size and mediastinal contours are within normal limits. Both lungs are clear. Old right proximal humeral fracture. IMPRESSION: No active disease. Electronically Signed   By: Marijo Conception M.D.   On: 08/02/2022 17:44    Procedures Procedures    Medications Ordered in ED Medications - No data to display  ED Course/ Medical Decision Making/ A&P                           Medical Decision Making Amount and/or Complexity of Data Reviewed Labs: ordered. Radiology: ordered.   Care of patient handed off to Dr. Matilde Sprang at this  time. Please see his note for completion of care.  Final Clinical Impression(s) / ED Diagnoses Final diagnoses:  None    Rx / DC Orders ED Discharge Orders     None         Mickie Hillier, PA-C 08/02/22 1757    Teressa Lower, MD 08/03/22 1212

## 2022-08-02 NOTE — Assessment & Plan Note (Signed)
Stable, denies chest pain.  Continue Toprol-XL and aspirin.

## 2022-08-02 NOTE — Hospital Course (Signed)
Jasmin Crane is a 88 y.o. female with medical history significant for CAD s/p remote stent, PVD, HTN, HLD, carotid artery stenosis, hypothyroidism, infrarenal aortic aneurysm who is s/p recent ERCP with sphincterotomy and biliary stent placement 07/21/2022 who is admitted with concern for malfunctioning biliary stent.

## 2022-08-02 NOTE — ED Notes (Signed)
Pt placed on nasal cannula 2 lpm , O2 sat drops to 88% when falling asleep

## 2022-08-02 NOTE — H&P (Signed)
History and Physical    Jasmin Crane ZLD:[id number] DOB: [birth year] DOA: 08/02/2022  PCP: Hayden Rasmussen, MD  Patient coming from: [facility]  I have personally briefly reviewed patient's old medical records in [place]  Chief Complaint: Rigors  HPI: Jasmin Crane is a 88 y.o. female with medical history significant for CAD s/p remote stent, PVD, HTN, HLD, carotid artery stenosis, hypothyroidism, infrarenal aortic aneurysm who presented to the ED for evaluation of rigors and abdominal pain.  Patient recently underwent EUS followed by ERCP on 07/21/2022 by Dr. Benson Norway.  She was found to have a malignant appearing biliary stricture with dilated hepatic ducts with a mass causing obstruction.  Biliary sphincterotomy was performed and 1 plastic stent was placed into the common bile duct.  CBD brushing cytology showed atypical cells, no malignant cells identified.  Patient states she developed rigors, cold sweats, nausea without emesis earlier today.  She has had intermittent right flank and RUQ abdominal pain more noticeable at night and not correlated with meals.  She reports recent constipation as well.  ED Course  Labs/Imaging on admission: I have personally reviewed following labs and imaging studies.  Initial vitals showed BP 117/50, pulse 84, RR 17, temp 99.3 F, SpO2 95% on room air.  Labs show AST 102, ALT 40, alk phos 384, total bilirubin 0.8, sodium 137, potassium 4.0, bicarb 25, BUN 20, creatinine 0.99, serum glucose 194, lactic acid 2.0 > 1.5, lipase 63, magnesium 1.8, WBC 10.8, hemoglobin 10.9, platelets 132,000.  SARS-CoV-2 and influenza PCR negative.  Blood cultures in process.  Portable chest x-ray negative for focal consolidation, edema, effusion.  CT abdomen/pelvis with contrast showed dilation of intrahepatic bile ducts, biliary stent noted with possibility of malfunction of the stent.  No evidence of intestinal obstruction, pneumoperitoneum, hydronephrosis.   Aortic atherosclerosis, 3.1 cm infrarenal aortic aneurysm, 40-50% decrease in height of body of T12 vertebra noted.  Patient was given 1 L LR.  EDP spoke with GI, Dr. Collene Mares, who indicated that Dr. Benson Norway will see patient in the morning.  The hospitalist service was consulted to admit for further evaluation and management.  Review of Systems: All systems reviewed and are negative except as documented in history of present illness above.   Past Medical History:  Diagnosis Date   Arrhythmia    Cancer [facility])    skin cancer 2005   Carotid artery occlusion    Claudication of lower extremity (HCC)    Complication of anesthesia    one bad experience 30-40  yrs ago, since had procedures went okay   Coronary artery disease    Diverticulitis    Hyperlipidemia    Hypertension    Hypothyroidism    Myopathy    Paralysis of tongue    Sick sinus syndrome [facility])     Past Surgical History:  Procedure Laterality Date   APPENDECTOMY     BILIARY BRUSHING  07/21/2022   Procedure: BILIARY BRUSHING;  Surgeon: Carol Ada, MD;  Location: [facility];  Service: Gastroenterology;;   BILIARY STENT PLACEMENT [name] 07/21/2022   Procedure: BILIARY STENT PLACEMENT;  Surgeon: Carol Ada, MD;  Location: [facility];  Service: Gastroenterology;  Laterality: [name];   CAROTID ENDARTERECTOMY     CATARACT EXTRACTION, BILATERAL     CESAREAN SECTION     COLONOSCOPY     CORONARY ANGIOPLASTY     CORONARY STENT PLACEMENT     right coronary artery 1998   ENDOSCOPIC RETROGRADE CHOLANGIOPANCREATOGRAPHY (ERCP) WITH PROPOFOL [name] 07/21/2022  Procedure: ENDOSCOPIC RETROGRADE CHOLANGIOPANCREATOGRAPHY (ERCP) WITH PROPOFOL;  Surgeon: Carol Ada, MD;  Location: [facility];  Service: Gastroenterology;  Laterality: [name];   ESOPHAGOGASTRODUODENOSCOPY (EGD) WITH PROPOFOL [name] 07/21/2022   Procedure: ESOPHAGOGASTRODUODENOSCOPY (EGD) WITH PROPOFOL;  Surgeon: Carol Ada, MD;  Location: [facility];  Service: Gastroenterology;   Laterality: [name];   EUS [name] 07/21/2022   Procedure: UPPER ENDOSCOPIC ULTRASOUND (EUS) LINEAR;  Surgeon: Carol Ada, MD;  Location: [facility];  Service: Gastroenterology;  Laterality: [name];   INGUINAL HERNIA REPAIR Right 02/25/2019   Procedure: RIGHT INGUINAL HERNIA REPAIR WITH MESH;  Surgeon: Donnie Mesa, MD;  Location: [facility];  Service: General;  Laterality: Right;  LMA AND TAP BLOCK   MULTIPLE TOOTH EXTRACTIONS     SALIVARY GLAND SURGERY     removed 1998   SALPINGECTOMY     SKIN CANCER EXCISION     2005   SPHINCTEROTOMY  07/21/2022   Procedure: SPHINCTEROTOMY;  Surgeon: Carol Ada, MD;  Location: [facility];  Service: Gastroenterology;;    Social History:  reports that she has quit smoking. Her smoking use included cigarettes. She has never used smokeless tobacco. She reports current alcohol use. She reports that she does not use drugs.  Allergies  Allergen Reactions   Other Anaphylaxis and Swelling    Scallops   Shellfish Allergy Anaphylaxis and Swelling   Cozaar [Losartan] Nausea Only   Pletaal [Cilostazol] Palpitations    Family History  Problem Relation Age of Onset   Cancer Mother    Stroke Mother    Heart disease Father 43       CAD   Heart failure Sister    Arthritis Brother    Lung cancer Brother    Pancreatic cancer Brother    Heart attack Son 80     Prior to Admission medications   Medication Sig Start Date End Date Taking? Authorizing Provider  aspirin EC 81 MG tablet Take 81 mg by mouth daily.    [provider]  cholecalciferol (VITAMIN D3) 25 MCG (1000 UNIT) tablet Take 1,000 Units by mouth daily.    [provider]  citalopram (CELEXA) 10 MG tablet Take 10 mg by mouth daily. 12/27/20   [provider]  docusate sodium (COLACE) 100 MG capsule Take 100 mg by mouth daily as needed for mild constipation.    [provider]  donepezil (ARICEPT) 10 MG tablet Take 10 mg by mouth in the morning. 01/17/21   [provider]  ibuprofen (ADVIL) 200 MG tablet Take 200-400 mg by mouth every 6 (six) hours as needed for moderate pain.    [provider]  levothyroxine (SYNTHROID) 88 MCG tablet Take 88 mcg by mouth every evening. 05/26/21   [provider]  metoprolol succinate (TOPROL-XL) 25 MG 24 hr tablet TAKE 1 TABLET BY MOUTH EVERY DAY 07/31/22   Minus Breeding, MD  polyethylene glycol (MIRALAX / GLYCOLAX) 17 g packet Take 17 g by mouth daily as needed for moderate constipation.    [provider]  rosuvastatin (CRESTOR) 40 MG tablet TAKE 1 TABLET BY MOUTH NIGHTLY AT BEDTIME 08/10/21   Minus Breeding, MD    Physical Exam: Vitals:   08/02/22 2035 08/02/22 2051 08/02/22 2100 08/02/22 2130  BP: (!) 111/51  (!) 115/50 (!) 120/55  Pulse: 70  92 60  Resp: _0 Temp:  98.7 F (37.1 C)    TempSrc:  Oral    SpO2: 93%  99% 99%  Weight:  Height:       Constitutional: Resting in bed, NAD, calm, comfortable Eyes: EOMI, lids and conjunctivae normal ENMT: Mucous membranes are moist. Posterior pharynx clear of any exudate or lesions. Edentulous lower mouth. Neck: normal, supple, no masses. Respiratory: Bibasilar inspiratory crackles. Normal respiratory effort. No accessory muscle use.  Cardiovascular: Regular rate and rhythm, no murmurs / rubs / gallops. No extremity edema. 2+ pedal pulses. Abdomen: no tenderness, no masses palpated. No hepatosplenomegaly. Bowel sounds positive.  Musculoskeletal: no clubbing / cyanosis. No joint deformity upper and lower extremities. Good ROM, no contractures. Normal muscle tone.  Skin: no rashes, lesions, ulcers. No induration Neurologic: Sensation intact. Strength 5/5 in all 4.  Psychiatric: Normal judgment and insight. Alert and oriented x 3. Normal mood.   EKG: Personally reviewed. Sinus arrhythmia, rate 77, motion artifact present.  Assessment/Plan Principal Problem:   Biliary obstruction Active Problems:   Essential  hypertension   Hyperlipidemia   Coronary artery disease due to lipid rich plaque   Hypothyroidism   Jasmin Crane is a 88 y.o. female with medical history significant for CAD s/p remote stent, PVD, HTN, HLD, carotid artery stenosis, hypothyroidism, infrarenal aortic aneurysm who is s/p recent ERCP with sphincterotomy and biliary stent placement 07/21/2022 who is admitted with concern for malfunctioning biliary stent.  Assessment and Plan: * Biliary obstruction S/p recent ERCP s/p sphincterotomy and biliary stent placement.  Findings were concerning for malignant appearing biliary stricture and obstructing mass.  Cytology unrevealing.  Has appointment with oncology on 12/8.  CT A/P this admission concerning for biliary stent malfunction. -GI, Dr. Benson Norway to see in a.m. -Will keep n.p.o. after midnight -Gentle IV fluid hydration overnight  Hypothyroidism Continue Synthroid.  Coronary artery disease due to lipid rich plaque Stable, denies chest pain.  Continue Toprol-XL and aspirin.  Hyperlipidemia Holding statin for now.  Essential hypertension Continue Toprol-XL.  DVT prophylaxis: enoxaparin (LOVENOX) injection 40 mg Start: 08/03/22 2200 Code Status: DNR, confirmed with patient on admission Family Communication: Daughter at bedside Disposition Plan: From [employer], [place] pending clinical progress Consults called: GI, Dr. Adriana Mccallum Severity of Illness: The appropriate patient status for this patient is OBSERVATION. Observation status is judged to be reasonable and necessary in order to provide the required intensity of service to ensure the patient's safety. The patient's presenting symptoms, physical exam findings, and initial radiographic and laboratory data in the context of their medical condition is felt to place them at decreased risk for further clinical deterioration. Furthermore, it is anticipated that the patient will be medically stable for discharge from the hospital  within 2 midnights of admission.   Zada Finders MD [facility]  If 7PM-7AM, please contact night-coverage [email or web address]  08/02/2022, 10:35 PM

## 2022-08-03 DIAGNOSIS — R54 Age-related physical debility: Secondary | ICD-10-CM | POA: Diagnosis present

## 2022-08-03 DIAGNOSIS — Z87891 Personal history of nicotine dependence: Secondary | ICD-10-CM | POA: Diagnosis not present

## 2022-08-03 DIAGNOSIS — K831 Obstruction of bile duct: Secondary | ICD-10-CM | POA: Diagnosis present

## 2022-08-03 DIAGNOSIS — Z1152 Encounter for screening for COVID-19: Secondary | ICD-10-CM | POA: Diagnosis not present

## 2022-08-03 DIAGNOSIS — B961 Klebsiella pneumoniae [K. pneumoniae] as the cause of diseases classified elsewhere: Secondary | ICD-10-CM | POA: Diagnosis present

## 2022-08-03 DIAGNOSIS — I5032 Chronic diastolic (congestive) heart failure: Secondary | ICD-10-CM | POA: Diagnosis present

## 2022-08-03 DIAGNOSIS — I110 Hypertensive heart disease with heart failure: Secondary | ICD-10-CM | POA: Diagnosis present

## 2022-08-03 DIAGNOSIS — J449 Chronic obstructive pulmonary disease, unspecified: Secondary | ICD-10-CM | POA: Diagnosis not present

## 2022-08-03 DIAGNOSIS — I2510 Atherosclerotic heart disease of native coronary artery without angina pectoris: Secondary | ICD-10-CM | POA: Diagnosis present

## 2022-08-03 DIAGNOSIS — R7881 Bacteremia: Secondary | ICD-10-CM | POA: Diagnosis present

## 2022-08-03 DIAGNOSIS — C221 Intrahepatic bile duct carcinoma: Secondary | ICD-10-CM | POA: Diagnosis present

## 2022-08-03 DIAGNOSIS — F0280 Dementia in other diseases classified elsewhere without behavioral disturbance: Secondary | ICD-10-CM | POA: Diagnosis present

## 2022-08-03 DIAGNOSIS — I7143 Infrarenal abdominal aortic aneurysm, without rupture: Secondary | ICD-10-CM | POA: Diagnosis present

## 2022-08-03 DIAGNOSIS — Z66 Do not resuscitate: Secondary | ICD-10-CM | POA: Diagnosis present

## 2022-08-03 DIAGNOSIS — I739 Peripheral vascular disease, unspecified: Secondary | ICD-10-CM | POA: Diagnosis present

## 2022-08-03 DIAGNOSIS — Z955 Presence of coronary angioplasty implant and graft: Secondary | ICD-10-CM | POA: Diagnosis not present

## 2022-08-03 DIAGNOSIS — Z85828 Personal history of other malignant neoplasm of skin: Secondary | ICD-10-CM | POA: Diagnosis not present

## 2022-08-03 DIAGNOSIS — G309 Alzheimer's disease, unspecified: Secondary | ICD-10-CM | POA: Diagnosis present

## 2022-08-03 DIAGNOSIS — B9620 Unspecified Escherichia coli [E. coli] as the cause of diseases classified elsewhere: Secondary | ICD-10-CM | POA: Diagnosis present

## 2022-08-03 DIAGNOSIS — Z4659 Encounter for fitting and adjustment of other gastrointestinal appliance and device: Secondary | ICD-10-CM | POA: Diagnosis not present

## 2022-08-03 DIAGNOSIS — I700 Atherosclerosis of aorta: Secondary | ICD-10-CM | POA: Diagnosis present

## 2022-08-03 DIAGNOSIS — E039 Hypothyroidism, unspecified: Secondary | ICD-10-CM | POA: Diagnosis present

## 2022-08-03 DIAGNOSIS — I495 Sick sinus syndrome: Secondary | ICD-10-CM | POA: Diagnosis present

## 2022-08-03 DIAGNOSIS — E785 Hyperlipidemia, unspecified: Secondary | ICD-10-CM | POA: Diagnosis present

## 2022-08-03 DIAGNOSIS — I2583 Coronary atherosclerosis due to lipid rich plaque: Secondary | ICD-10-CM | POA: Diagnosis present

## 2022-08-03 DIAGNOSIS — H9190 Unspecified hearing loss, unspecified ear: Secondary | ICD-10-CM | POA: Diagnosis present

## 2022-08-03 LAB — COMPREHENSIVE METABOLIC PANEL
ALT: 33 U/L (ref 0–44)
AST: 56 U/L — ABNORMAL HIGH (ref 15–41)
Albumin: 2.4 g/dL — ABNORMAL LOW (ref 3.5–5.0)
Alkaline Phosphatase: 306 U/L — ABNORMAL HIGH (ref 38–126)
Anion gap: 8 (ref 5–15)
BUN: 16 mg/dL (ref 8–23)
CO2: 25 mmol/L (ref 22–32)
Calcium: 8.8 mg/dL — ABNORMAL LOW (ref 8.9–10.3)
Chloride: 106 mmol/L (ref 98–111)
Creatinine, Ser: 1 mg/dL (ref 0.44–1.00)
GFR, Estimated: 54 mL/min — ABNORMAL LOW (ref >60)
Glucose, Bld: 94 mg/dL (ref 70–99)
Potassium: 3.9 mmol/L (ref 3.5–5.1)
Sodium: 139 mmol/L (ref 135–145)
Total Bilirubin: 0.8 mg/dL (ref 0.3–1.2)
Total Protein: 6.1 g/dL — ABNORMAL LOW (ref 6.5–8.1)

## 2022-08-03 LAB — BLOOD CULTURE ID PANEL (REFLEXED) - BCID2

## 2022-08-03 LAB — CBC
HCT: 33.4 % — ABNORMAL LOW (ref 36.0–46.0)
Hemoglobin: 10.1 g/dL — ABNORMAL LOW (ref 12.0–15.0)
MCH: 26.6 pg (ref 26.0–34.0)
MCHC: 30.2 g/dL (ref 30.0–36.0)
MCV: 87.9 fL (ref 80.0–100.0)
Platelets: 122 10*3/uL — ABNORMAL LOW (ref 150–400)
RBC: 3.8 MIL/uL — ABNORMAL LOW (ref 3.87–5.11)
RDW: 18.9 % — ABNORMAL HIGH (ref 11.5–15.5)
WBC: 9.2 10*3/uL (ref 4.0–10.5)
nRBC: 0 % (ref 0.0–0.2)

## 2022-08-03 MED ORDER — SODIUM CHLORIDE 0.9 % IV SOLN
2.0000 g | INTRAVENOUS | Status: DC
  Administered 2022-08-03 – 2022-08-04 (×2): 2 g via INTRAVENOUS
  Filled 2022-08-03 (×2): qty 20

## 2022-08-03 MED ORDER — ENOXAPARIN SODIUM 300 MG/3ML IJ SOLN
20.0000 mg | INTRAMUSCULAR | Status: DC
  Administered 2022-08-04 (×2): 20 mg via SUBCUTANEOUS
  Filled 2022-08-03 (×5): qty 0.2

## 2022-08-03 NOTE — TOC Initial Note (Signed)
Transition of Care [facility]) - Initial/Assessment Note   Patient Details  Name: Jasmin Crane MRN: [id number] Date of Birth: February 28, 1932  [facility] CM/SW Contact:    Sherie Don, LCSW Phone Number: 08/03/2022, 2:12 PM  Clinical Narrative: Patient came from [facility]. CSW confirmed with daughter, Nancy Nordmann, that patient resides in independent living so an FL2 will not be required prior to discharge.  Expected Discharge Plan: Home/Self Care Barriers to Discharge: Continued Medical Work up  Patient Goals and CMS Choice Patient states their goals for this hospitalization and ongoing recovery are:: Return to independent living at [place] offered to / list presented to : NA  Expected Discharge Plan and Services Expected Discharge Plan: Home/Self Care In-house Referral: Clinical Social Work Post Acute Care Choice: NA Living arrangements for the past 2 months: [place]             DME Arranged: [name] DME Agency: NA  Prior Living Arrangements/Services Living arrangements for the past 2 months: [place] Lives with:: Self Patient language and need for interpreter reviewed:: Yes Do you feel safe going back to the place where you live?: Yes      Need for Family Participation in Patient Care: No (Comment) Care giver support system in place?: Yes (comment) Current home services: DME Criminal Activity/Legal Involvement Pertinent to Current Situation/Hospitalization: No - Comment as needed  Emotional Assessment Orientation: : Oriented to Self, Oriented to Place, Oriented to Situation Alcohol / Substance Use: Not Applicable Psych Involvement: No (comment)  Admission diagnosis:  Biliary obstruction [K83.1] Patient Active Problem List   Diagnosis Date Noted   Biliary obstruction 08/02/2022   Pneumonia 01/23/2022   Fall at home, initial encounter 01/23/2022   Left foot pain 01/23/2022   Chronic constipation  01/23/2022   Chronic diastolic CHF (congestive heart failure) ([place]) 01/23/2022   Hypothyroidism 01/23/2022   Alzheimer disease ([place]) 01/23/2022   Emphysema lung ([place]) 03/09/2021   Pleural effusion 03/09/2021   Cardiomegaly 03/09/2021   Acute respiratory failure with hypoxia ([place]) 03/08/2021   Educated about COVID-19 virus infection 05/30/2020   Dyslipidemia 05/28/2019   Medication management 06/10/2018   Other fatigue 06/10/2018   PVD (peripheral vascular disease) ([place]) 06/10/2018   Right lower quadrant abdominal pain 06/10/2018   AAA (abdominal aortic aneurysm) without rupture ([place]) 06/10/2018   Aortic valve regurgitation 06/10/2018   Carotid artery disease ([place]) 03/06/2017   Claudication in peripheral vascular disease ([place]) 03/06/2017   Essential hypertension 03/06/2017   Hyperlipidemia 03/06/2017   Coronary artery disease due to lipid rich plaque 03/06/2017   PCP:  Hayden Rasmussen, MD Pharmacy:   [facility] - [place], [place] - [address] [place] [address] Phone: [phone] Fax: [phone]  Social Determinants of Health (SDOH) Interventions    Readmission Risk Interventions     No data to display

## 2022-08-03 NOTE — Progress Notes (Signed)
PROGRESS NOTE  Jasmin Crane  DOB: May 26, 1932  PCP: Hayden Rasmussen, MD CZY:[id number]  DOA: 08/02/2022  LOS: 0 days  Hospital Day: 2  Brief narrative: Jasmin Crane is a 88 y.o. female with PMH significant for HTN, HLD, CAD/stent, sick sinus syndrome, PVD, carotid artery stenosis, infrarenal AAA, hypothyroidism who is a resident at [facility]. 12/6, patient was brought to the ED for evaluation of pain rigors. Patient recently underwent EUS followed by ERCP on 07/21/2022 by Dr. Benson Norway.  She was found to have a malignant appearing biliary stricture with dilated hepatic ducts with a mass causing obstruction.  Biliary sphincterotomy was performed and 1 plastic stent was placed into the common bile duct.  CBD brushing cytology showed atypical cells and no malignant cells were identified. Patient states she developed rigors, cold sweats, nausea without emesis earlier on the day of presentation.  She has had intermittent right flank and RUQ abdominal pain more noticeable at night and not correlated with meals. She reports recent constipation as well.  In the ED, patient was hemodynamically stable Labs showed elevated AST, ALT, alk phos, normal total bili SARS-CoV-2 and influenza PCR negative.  Blood cultures in process. Portable chest x-ray negative for focal consolidation, edema, effusion.   CT abdomen/pelvis with contrast showed dilation of intrahepatic bile ducts, biliary stent noted with possibility of malfunction of the stent.   EDP spoke with GI, Dr. Collene Mares, who indicated that Dr. Benson Norway will see patient as a consult  Admitted to [facility].    Subjective: Patient was seen and examined this morning.  Pleasant elderly Caucasian female.  Propped up in bed.  Not in distress.  Daughter at bedside. Pending GI evaluation this morning Remains hemodynamically stable. Also, blood culture sent on admission is showing E. coli in BCID  Assessment and plan: Malfunction of biliary stent  s/p recent ERCP  s/p sphincterotomy and biliary stent placement -11/24 Findings were concerning for malignant appearing biliary stricture and obstructing mass.  Cytology unrevealing. Has appointment with oncology on 12/8.  CT A/P this admission concerning for biliary stent malfunction. GI Dr. Benson Norway to see.  E. coli bacteremia Noted in blood cultures sent on admission.  Discussed with pharmacy.  Start on IV Rocephin Follow-up final culture report.  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   Patient has the planned first evaluation with Dr. Benay Spice tomorrow.  But she ended up in the hospital.  Will let Dr. Benay Spice know  CAD/HLD Aortic atherosclerosis Infrarenal AAA No anginal symptoms currently Continue Toprol and aspirin Statin on hold   Essential hypertension Continue Toprol-XL.  Hypothyroidism Continue Synthroid.    Goals of care   Code Status: DNR    Mobility: Encourage ambulation.  Infusions:   cefTRIAXone (ROCEPHIN)  IV 2 g (08/03/22 1142)    Scheduled Meds:  aspirin EC  81 mg Oral Daily   citalopram  10 mg Oral Daily   donepezil  10 mg Oral Daily   enoxaparin (LOVENOX) injection  20 mg Subcutaneous Q24H   levothyroxine  88 mcg Oral QHS   lidocaine  1 patch Transdermal Q24H   metoprolol succinate  25 mg Oral Daily    PRN meds: ondansetron **OR** ondansetron (ZOFRAN) IV, senna-docusate   Skin assessment:     Nutritional status:  Body mass index is 17.5 kg/m.          Diet:  Diet Order  DIET SOFT Room service appropriate? Yes; Fluid consistency: Thin  Diet effective now                   DVT prophylaxis: Lovenox subcu   Antimicrobials: IV Rocephin Fluid: None Consultants: GI Family Communication: Daughter at bedside  Status is: Observation  Continue in-hospital care because: Pending procedure, also has bacteremia Level of care: Med-Surg   Dispo:  The patient is from: SNF              Anticipated d/c is to: Likely back to SNF              Patient currently is not medically stable to d/c.   Difficult to place patient No       Antimicrobials: Anti-infectives (From admission, onward)    Start     Dose/Rate Route Frequency Ordered Stop   08/03/22 1200  cefTRIAXone (ROCEPHIN) 2 g in sodium chloride 0.9 % 100 mL IVPB        2 g 200 mL/hr over 30 Minutes Intravenous Every 24 hours 08/03/22 1105         Objective: Vitals:   08/03/22 0514 08/03/22 0908  BP: (!) 119/46 (!) 112/50  Pulse: 68 66  Resp: 18 17  Temp: 97.9 F (36.6 C) 98.6 F (37 C)  SpO2: 94% 93%    Intake/Output Summary (Last 24 hours) at 08/03/2022 1156 Last data filed at 08/03/2022 0933 Gross per 24 hour  Intake 1146.5 ml  Output 250 ml  Net 896.5 ml   Filed Weights   08/02/22 1708  Weight: 43.4 kg   Weight change:  Body mass index is 17.5 kg/m.   Physical Exam: General exam: Pleasant elderly Caucasian female.  Not in physical distress Skin: No rashes, lesions or ulcers. HEENT: Atraumatic, normocephalic, no obvious bleeding Lungs: Clear to auscultation bilaterally CVS: Regular rate and rhythm, no murmur GI/Abd soft, mild tenderness in right upper quadrant, nondistended, bowel sound present CNS: Alert, awake, oriented x 3 Psychiatry: Mood appropriate Extremities: No pedal edema, no calf tenderness  Data Review: I have personally reviewed the laboratory data and studies available.  F/u labs ordered Unresulted Labs (From admission, onward)     Start     Ordered   08/04/22 0500  CBC with Differential/Platelet  Tomorrow morning,   R        08/03/22 1156   08/04/22 0500  Comprehensive metabolic panel  Tomorrow morning,   R        08/03/22 1156   08/04/22 0500  Lipase, blood  Tomorrow morning,   R        08/03/22 1156            Signed, Terrilee Croak, MD [facility] 08/03/2022

## 2022-08-03 NOTE — Progress Notes (Signed)
Looked in all areas, to locate Lovenox and Lidocaine patch for the pt. Couldn't locate it. Placed call to pharmacy and spoke with staff. They stated that they would send it up. Awaiting.

## 2022-08-03 NOTE — ED Notes (Signed)
ED TO INPATIENT HANDOFF REPORT  Name/Age/Gender Jasmin Crane 88 y.o. female  Code Status    Code Status Orders  (From admission, onward)           Start     Ordered   08/02/22 2230  Do not attempt resuscitation (DNR)  Continuous       Question Answer Comment  In the event of cardiac or respiratory ARREST Do not call a "code blue"   In the event of cardiac or respiratory ARREST Do not perform Intubation, CPR, defibrillation or ACLS   In the event of cardiac or respiratory ARREST Use medication by any route, position, wound care, and other measures to relive pain and suffering. May use oxygen, suction and manual treatment of airway obstruction as needed for comfort.      08/02/22 2230           Code Status History     Date Active Date Inactive Code Status Order ID Comments User Context   01/23/2022 1857 01/28/2022 0321 Full Code [id number]  Jonnie Finner, DO Inpatient   03/08/2021 2338 03/11/2021 2307 Full Code [id number]  Toy Baker, MD ED       Home/SNF/Other Skilled nursing facility  Chief Complaint Biliary obstruction [K83.1]  Level of Care/Admitting Diagnosis ED Disposition     ED Disposition  Admit   Condition  --   [place] [facility] Area: [facility] [[id number]]  Level of Care: Med-Surg [16]  May place patient in observation at [facility] or [place] if equivalent level of care is available:: No  Covid Evaluation: Confirmed COVID Negative  Diagnosis: Biliary obstruction [694854]  Admitting Physician: Lenore Cordia [[id number]]  Attending Physician: Lenore Cordia [[id number]]          Medical History Past Medical History:  Diagnosis Date   Arrhythmia    Cancer ([place])    skin cancer 2005   Carotid artery occlusion    Claudication of lower extremity ([place])    Complication of anesthesia    one bad experience 30-40  yrs ago, since had procedures went okay   Coronary artery disease    Diverticulitis    Hyperlipidemia     Hypertension    Hypothyroidism    Myopathy    Paralysis of tongue    Sick sinus syndrome ([facility])     Allergies Allergies  Allergen Reactions   Other Anaphylaxis and Swelling    Scallops   Shellfish Allergy Anaphylaxis and Swelling   Cozaar [Losartan] Nausea Only   Pletaal [Cilostazol] Palpitations    IV Location/Drains/Wounds Patient Lines/Drains/Airways Status     Active Line/Drains/Airways     Name Placement date Placement time Site Days   Peripheral IV 08/02/22 20 G Anterior;Right Forearm 08/02/22  1715  Forearm  1   GI Stent 07/21/22  0915  --  13   Incision (Closed) 02/25/19 Groin Right 02/25/19  0758  -- 1255            Labs/Imaging Results for orders placed or performed during the hospital encounter of 08/02/22 (from the past 48 hour(s))  Resp Panel by RT-PCR (Flu A&B, Covid) Anterior Nasal Swab     Status: None   Collection Time: 08/02/22  5:13 PM   Specimen: Anterior Nasal Swab  Result Value Ref Range   SARS Coronavirus 2 by RT PCR NEGATIVE NEGATIVE    Comment: (NOTE) SARS-CoV-2 target nucleic acids are NOT DETECTED.  The SARS-CoV-2 RNA is generally detectable in upper respiratory  specimens during the acute phase of infection. The lowest concentration of SARS-CoV-2 viral copies this assay can detect is 138 copies/mL. A negative result does not preclude SARS-Cov-2 infection and should not be used as the sole basis for treatment or other patient management decisions. A negative result may occur with  improper specimen collection/handling, submission of specimen other than nasopharyngeal swab, presence of viral mutation(s) within the areas targeted by this assay, and inadequate number of viral copies(<138 copies/mL). A negative result must be combined with clinical observations, patient history, and epidemiological information. The expected result is Negative.  Fact Sheet for Patients:  EntrepreneurPulse.com.au  Fact Sheet for Healthcare  Providers:  IncredibleEmployment.be  This test is no t yet approved or cleared by the [place] FDA and  has been authorized for detection and/or diagnosis of SARS-CoV-2 by FDA under an Emergency Use Authorization (EUA). This EUA will remain  in effect (meaning this test can be used) for the duration of the COVID-19 declaration under Section 564(b)(1) of the Act, 21 U.S.C.section 360bbb-3(b)(1), unless the authorization is terminated  or revoked sooner.       Influenza A by PCR NEGATIVE NEGATIVE   Influenza B by PCR NEGATIVE NEGATIVE    Comment: (NOTE) The Xpert Xpress SARS-CoV-2/FLU/RSV plus assay is intended as an aid in the diagnosis of influenza from Nasopharyngeal swab specimens and should not be used as a sole basis for treatment. Nasal washings and aspirates are unacceptable for Xpert Xpress SARS-CoV-2/FLU/RSV testing.  Fact Sheet for Patients: EntrepreneurPulse.com.au  Fact Sheet for Healthcare Providers: IncredibleEmployment.be  This test is not yet approved or cleared by the [place] FDA and has been authorized for detection and/or diagnosis of SARS-CoV-2 by FDA under an Emergency Use Authorization (EUA). This EUA will remain in effect (meaning this test can be used) for the duration of the COVID-19 declaration under Section 564(b)(1) of the Act, 21 U.S.C. section 360bbb-3(b)(1), unless the authorization is terminated or revoked.  Performed at [facility], [place] [address], [place], [place] [address]   Comprehensive metabolic panel     Status: Abnormal   Collection Time: 08/02/22  5:15 PM  Result Value Ref Range   Sodium 137 135 - 145 mmol/L   Potassium 4.0 3.5 - 5.1 mmol/L   Chloride 106 98 - 111 mmol/L   CO2 25 22 - 32 mmol/L   Glucose, Bld 194 (H) 70 - 99 mg/dL    Comment: Glucose reference range applies only to samples taken after fasting for at least 8 hours.   BUN 20 8 - 23  mg/dL   Creatinine, Ser 0.99 0.44 - 1.00 mg/dL   Calcium 8.9 8.9 - 10.3 mg/dL   Total Protein 6.5 6.5 - 8.1 g/dL   Albumin 3.2 (L) 3.5 - 5.0 g/dL   AST 102 (H) 15 - 41 U/L   ALT 48 (H) 0 - 44 U/L   Alkaline Phosphatase 384 (H) 38 - 126 U/L   Total Bilirubin 0.8 0.3 - 1.2 mg/dL   GFR, Estimated 55 (L) >60 mL/min    Comment: (NOTE) Calculated using the CKD-EPI Creatinine Equation (2021)    Anion gap 6 5 - 15    Comment: Performed at [facility], [place] [address] [place], [place] [address]  Lactic acid, plasma     Status: Abnormal   Collection Time: 08/02/22  5:15 PM  Result Value Ref Range   Lactic Acid, Venous 2.0 (HH) 0.5 - 1.9 mmol/L    Comment: CRITICAL  RESULT CALLED TO, READ BACK BY AND VERIFIED WITH Buford Dresser, RN 08/02/22 1819 J. COLE Performed at [facility], [place] [address], [place], [place] [address]   Culture, blood (routine x 2)     Status: None (Preliminary result)   Collection Time: 08/02/22  5:15 PM   Specimen: BLOOD RIGHT FOREARM  Result Value Ref Range   Specimen Description      BLOOD RIGHT FOREARM Performed at [facility], [place] [address], [place], [place] [address]    Special Requests      BOTTLES DRAWN AEROBIC AND ANAEROBIC Blood Culture adequate volume Performed at [place] [address], [place], [place] [address]    Culture PENDING    Report Status PENDING   CBC with Differential     Status: Abnormal   Collection Time: 08/02/22  5:15 PM  Result Value Ref Range   WBC 10.8 (H) 4.0 - 10.5 K/uL   RBC 4.06 3.87 - 5.11 MIL/uL   Hemoglobin 10.9 (L) 12.0 - 15.0 g/dL   HCT 35.9 (L) 36.0 - 46.0 %   MCV 88.4 80.0 - 100.0 fL   MCH 26.8 26.0 - 34.0 pg   MCHC 30.4 30.0 - 36.0 g/dL   RDW 18.7 (H) 11.5 - 15.5 %   Platelets 132 (L) 150 - 400 K/uL   nRBC 0.0 0.0 - 0.2 %   Neutrophils Relative % 89 %   Neutro Abs 9.7 (H) 1.7 - 7.7 K/uL   Lymphocytes Relative 3 %   Lymphs Abs 0.4 (L) 0.7 - 4.0 K/uL    Monocytes Relative 7 %   Monocytes Absolute 0.7 0.1 - 1.0 K/uL   Eosinophils Relative 0 %   Eosinophils Absolute 0.0 0.0 - 0.5 K/uL   Basophils Relative 1 %   Basophils Absolute 0.1 0.0 - 0.1 K/uL   Immature Granulocytes 0 %   Abs Immature Granulocytes 0.02 0.00 - 0.07 K/uL    Comment: Performed at [facility], [place] [address], [place], [place] [address]  Magnesium     Status: None   Collection Time: 08/02/22  5:15 PM  Result Value Ref Range   Magnesium 1.8 1.7 - 2.4 mg/dL    Comment: Performed at [facility], [place] [address], [place], [place] [address]  Lipase, blood     Status: Abnormal   Collection Time: 08/02/22  5:15 PM  Result Value Ref Range   Lipase 63 (H) 11 - 51 U/L    Comment: Performed at [facility], [place] [address], [place], [place] [address]  Lactic acid, plasma     Status: None   Collection Time: 08/02/22  8:41 PM  Result Value Ref Range   Lactic Acid, Venous 1.5 0.5 - 1.9 mmol/L    Comment: Performed at [facility], [place] [address], [place], [place] [address]  Urinalysis, Routine w reflex microscopic Urine, Clean Catch     Status: Abnormal   Collection Time: 08/02/22  9:19 PM  Result Value Ref Range   Color, Urine STRAW (A) YELLOW   APPearance CLEAR CLEAR   Specific Gravity, Urine 1.016 1.005 - 1.030   pH 7.0 5.0 - 8.0   Glucose, UA 50 (A) NEGATIVE mg/dL   Hgb urine dipstick NEGATIVE NEGATIVE   Bilirubin Urine NEGATIVE NEGATIVE   Ketones, ur NEGATIVE NEGATIVE mg/dL   Protein, ur NEGATIVE NEGATIVE mg/dL   Nitrite NEGATIVE NEGATIVE   Leukocytes,Ua NEGATIVE NEGATIVE  Comment: Performed at [facility], [place] [address] [place], [place] [address]   US Abdomen Limited RUQ (LIVER/GB)  Result Date: 08/02/2022 CLINICAL DATA:  Biliary dilatation. EXAM: ULTRASOUND ABDOMEN LIMITED RIGHT UPPER QUADRANT COMPARISON:  CT abdomen pelvis dated 08/02/2022. FINDINGS: Evaluation  is limited due to overlying bowel gas and body habitus. Gallbladder: The gallbladder is not visualized. Common bile duct: Diameter: A stent is noted within the common bile duct. The common bile duct is poorly delineated. There is moderate dilatation of the intrahepatic biliary trees better seen on the CT. Liver: The liver is grossly unremarkable. Portal vein is patent on color Doppler imaging with normal direction of blood flow towards the liver. Other: None. IMPRESSION: Moderate dilatation of the intrahepatic biliary trees. A stent is seen within the common bile duct. Electronically Signed   By: Anner Crete M.D.   On: 08/02/2022 22:51   CT Abdomen Pelvis W Contrast  Result Date: 08/02/2022 CLINICAL DATA:  Abdominal pain EXAM: CT ABDOMEN AND PELVIS WITH CONTRAST TECHNIQUE: Multidetector CT imaging of the abdomen and pelvis was performed using the standard protocol following bolus administration of intravenous contrast. RADIATION DOSE REDUCTION: This exam was performed according to the departmental dose-optimization program which includes automated exposure control, adjustment of the mA and/or kV according to patient size and/or use of iterative reconstruction technique. CONTRAST:  157m OMNIPAQUE IOHEXOL 300 MG/ML  SOLN COMPARISON:  01/01/2018 FINDINGS: Lower chest: Coronary artery calcifications are seen. There is peribronchial thickening. There is slight increase in interstitial markings in the lower lung fields. There is no pleural effusion. Hepatobiliary: Gallbladder is not seen. There is moderate to marked dilation of intrahepatic bile ducts. There is biliary stent in the lumen of extrahepatic bile ducts entering the duodenum. Pancreas: There is mild prominence of pancreatic duct. There is 8 mm fluid density structure in head of the pancreas with no significant interval change. Spleen: Unremarkable. Adrenals/Urinary Tract: Adrenals are unremarkable. There is no hydronephrosis. There are no renal or  ureteral stones. There is 1.7 cm cyst in the midportion of right kidney. Urinary bladder is distended. There is no wall thickening in the bladder. Stomach/Bowel: Small hiatal hernia is seen. Stomach is not distended. There is slight prominence of duodenum. There is no significant dilation of jejunum and ileum. Appendix is not seen. Multiple diverticula seen in colon. There is no significant wall thickening in colon. Vascular/Lymphatic: Extensive atherosclerotic plaques and calcifications are seen in aorta and its major branches. There is ectasia of infrarenal abdominal aorta measuring up to 3.1 cm in diameter. Aorta measured 2.7 cm in maximum diameter in the previous study. There is 2.8 cm ectasia in distal abdominal aorta close to its bifurcation. Reproductive: Uterus is unremarkable. There is 2.3 cm fluid density structure in left adnexa which measured 2.7 cm in diameter in the previous study. Other: There is no ascites or pneumoperitoneum. Musculoskeletal: Degenerative changes are noted in lumbar spine with disc space narrowing, bony spurs and encroachment of neural foramina at multiple levels. There is 40-50% decrease in height of body of T12 vertebra which is new. IMPRESSION: There is no evidence of intestinal obstruction or pneumoperitoneum. There is no hydronephrosis. There is moderate to marked dilation of intrahepatic bile ducts. Biliary stent is noted in x-ray body bile ducts. Possibility of malfunction of the stent should be considered. Increased interstitial markings in the lower lung fields may suggest scarring or subsegmental atelectasis. Coronary artery calcifications are seen. Aortic atherosclerosis.  There is 3.1 cm infrarenal aortic aneurysm.  Recommend follow-up every 3 years.Reference: J Am Coll Radiol 5188;41:660-630. There is new 40-50% decrease in height of body of T12 vertebra which may be recent or old. Lumbar spondylosis. Small hiatal hernia. Diverticulosis of colon without signs of focal  diverticulitis. Other findings as described in the body of the report. Electronically Signed   By: Elmer Picker M.D.   On: 08/02/2022 20:29   DG Chest Port 1 View  Result Date: 08/02/2022 CLINICAL DATA:  Fever. EXAM: PORTABLE CHEST 1 VIEW COMPARISON:  Jan 23, 2022. FINDINGS: The heart size and mediastinal contours are within normal limits. Both lungs are clear. Old right proximal humeral fracture. IMPRESSION: No active disease. Electronically Signed   By: Marijo Conception M.D.   On: 08/02/2022 17:44    Pending Labs Unresulted Labs (From admission, onward)     Start     Ordered   08/03/22 0500  Comprehensive metabolic panel  Tomorrow morning,   R        08/02/22 2230   08/03/22 0500  CBC  Tomorrow morning,   R        08/02/22 2230   08/02/22 1712  Culture, blood (routine x 2)  BLOOD CULTURE X 2,   R     Question:  Patient immune status  Answer:  Normal   08/02/22 1713            Vitals/Pain Today's Vitals   08/02/22 2051 08/02/22 2100 08/02/22 2130 08/02/22 2230  BP:  (!) 115/50 (!) 120/55 (!) 114/53  Pulse:  92 60 61  Resp:  '18 19 10  '$ Temp: 98.7 F (37.1 C)     TempSrc: Oral     SpO2:  99% 99% 93%  Weight:      Height:      PainSc:        Isolation Precautions No active isolations  Medications Medications  lidocaine (LIDODERM) 5 % 1 patch (1 patch Transdermal Patch Applied 08/02/22 1854)  enoxaparin (LOVENOX) injection 40 mg (has no administration in time range)  ondansetron (ZOFRAN) tablet 4 mg (has no administration in time range)    Or  ondansetron (ZOFRAN) injection 4 mg (has no administration in time range)  senna-docusate (Senokot-S) tablet 1 tablet (has no administration in time range)  lactated ringers infusion (has no administration in time range)  donepezil (ARICEPT) tablet 10 mg (has no administration in time range)  citalopram (CELEXA) tablet 10 mg (has no administration in time range)  aspirin EC tablet 81 mg (has no administration in time range)   levothyroxine (SYNTHROID) tablet 88 mcg (has no administration in time range)  metoprolol succinate (TOPROL-XL) 24 hr tablet 25 mg (has no administration in time range)  lactated ringers bolus 1,000 mL (0 mLs Intravenous Stopped 08/02/22 1938)  iohexol (OMNIPAQUE) 300 MG/ML solution 100 mL (100 mLs Intravenous Contrast Given 08/02/22 2003)    Mobility walks with person assist

## 2022-08-03 NOTE — H&P (View-Only) (Signed)
Reason for Consult: Chills, nausea, and known biliary stricture Referring Physician: [facility]  [name] HPI: This is an 88 year old female with a PMH of HTN, hyperlipidemia, hypothyroidism, and PVD admitted with chills and nausea.  She is s/p a 7 fr x 7 cm plastic biliary stent placement on 07/21/2022 for a proximal extrahepatic biliary stricture.  The stricture exhibited characteristics of being malignant, but the brushings were not conclusive.  Also, on the EUS a clear lesion for FNA was identified.  She tolerated the index ERCP without any issues and she was well until this time.  Tomorrow she was to meet with Dr. Benay Spice to discuss her situation.  Currently she is feeling better, but she is tiring from all the medical appointment and procedures.  Past Medical History:  Diagnosis Date   Arrhythmia    Cancer [facility])    skin cancer 2005   Carotid artery occlusion    Claudication of lower extremity (HCC)    Complication of anesthesia    one bad experience 30-40  yrs ago, since had procedures went okay   Coronary artery disease    Diverticulitis    Hyperlipidemia    Hypertension    Hypothyroidism    Myopathy    Paralysis of tongue    Sick sinus syndrome [facility])     Past Surgical History:  Procedure Laterality Date   APPENDECTOMY     BILIARY BRUSHING  07/21/2022   Procedure: BILIARY BRUSHING;  Surgeon: Carol Ada, MD;  Location: [facility];  Service: Gastroenterology;;   BILIARY STENT PLACEMENT [name] 07/21/2022   Procedure: BILIARY STENT PLACEMENT;  Surgeon: Carol Ada, MD;  Location: [facility];  Service: Gastroenterology;  Laterality: [name];   CAROTID ENDARTERECTOMY     CATARACT EXTRACTION, BILATERAL     CESAREAN SECTION     COLONOSCOPY     CORONARY ANGIOPLASTY     CORONARY STENT PLACEMENT     right coronary artery 1998   ENDOSCOPIC RETROGRADE CHOLANGIOPANCREATOGRAPHY (ERCP) WITH PROPOFOL [name] 07/21/2022   Procedure: ENDOSCOPIC RETROGRADE  CHOLANGIOPANCREATOGRAPHY (ERCP) WITH PROPOFOL;  Surgeon: Carol Ada, MD;  Location: [facility];  Service: Gastroenterology;  Laterality: [name];   ESOPHAGOGASTRODUODENOSCOPY (EGD) WITH PROPOFOL [name] 07/21/2022   Procedure: ESOPHAGOGASTRODUODENOSCOPY (EGD) WITH PROPOFOL;  Surgeon: Carol Ada, MD;  Location: [facility];  Service: Gastroenterology;  Laterality: [name];   EUS [name] 07/21/2022   Procedure: UPPER ENDOSCOPIC ULTRASOUND (EUS) LINEAR;  Surgeon: Carol Ada, MD;  Location: [facility];  Service: Gastroenterology;  Laterality: [name];   INGUINAL HERNIA REPAIR Right 02/25/2019   Procedure: RIGHT INGUINAL HERNIA REPAIR WITH MESH;  Surgeon: Donnie Mesa, MD;  Location: [place];  Service: General;  Laterality: Right;  LMA AND TAP BLOCK   MULTIPLE TOOTH EXTRACTIONS     SALIVARY GLAND SURGERY     removed 1998   SALPINGECTOMY     SKIN CANCER EXCISION     2005   SPHINCTEROTOMY  07/21/2022   Procedure: SPHINCTEROTOMY;  Surgeon: Carol Ada, MD;  Location: [facility];  Service: Gastroenterology;;    Family History  Problem Relation Age of Onset   Cancer Mother    Stroke Mother    Heart disease Father 54       CAD   Heart failure Sister    Arthritis Brother    Lung cancer Brother    Pancreatic cancer Brother    Heart attack Son 28    Social History:  reports that she has quit smoking. Her smoking use included cigarettes. She has  never used smokeless tobacco. She reports current alcohol use. She reports that she does not use drugs.  Allergies:  Allergies  Allergen Reactions   Other Anaphylaxis and Swelling    Scallops   Shellfish Allergy Anaphylaxis and Swelling   Cozaar [Losartan] Nausea Only   Pletaal [Cilostazol] Palpitations    Medications: Scheduled:  aspirin EC  81 mg Oral Daily   citalopram  10 mg Oral Daily   donepezil  10 mg Oral Daily   enoxaparin (LOVENOX) injection  20 mg Subcutaneous Q24H   levothyroxine  88 mcg Oral QHS   lidocaine  1 patch Transdermal Q24H    metoprolol succinate  25 mg Oral Daily   Continuous:  cefTRIAXone (ROCEPHIN)  IV 2 g (08/03/22 1142)    Results for orders placed or performed during the hospital encounter of 08/02/22 (from the past 24 hour(s))  Lactic acid, plasma     Status: None   Collection Time: 08/02/22  8:41 PM  Result Value Ref Range   Lactic Acid, Venous 1.5 0.5 - 1.9 mmol/L  Urinalysis, Routine w reflex microscopic Urine, Clean Catch     Status: Abnormal   Collection Time: 08/02/22  9:19 PM  Result Value Ref Range   Color, Urine STRAW (A) YELLOW   APPearance CLEAR CLEAR   Specific Gravity, Urine 1.016 1.005 - 1.030   pH 7.0 5.0 - 8.0   Glucose, UA 50 (A) NEGATIVE mg/dL   Hgb urine dipstick NEGATIVE NEGATIVE   Bilirubin Urine NEGATIVE NEGATIVE   Ketones, ur NEGATIVE NEGATIVE mg/dL   Protein, ur NEGATIVE NEGATIVE mg/dL   Nitrite NEGATIVE NEGATIVE   Leukocytes,Ua NEGATIVE NEGATIVE  Comprehensive metabolic panel     Status: Abnormal   Collection Time: 08/03/22  5:18 AM  Result Value Ref Range   Sodium 139 135 - 145 mmol/L   Potassium 3.9 3.5 - 5.1 mmol/L   Chloride 106 98 - 111 mmol/L   CO2 25 22 - 32 mmol/L   Glucose, Bld 94 70 - 99 mg/dL   BUN 16 8 - 23 mg/dL   Creatinine, Ser 1.00 0.44 - 1.00 mg/dL   Calcium 8.8 (L) 8.9 - 10.3 mg/dL   Total Protein 6.1 (L) 6.5 - 8.1 g/dL   Albumin 2.4 (L) 3.5 - 5.0 g/dL   AST 56 (H) 15 - 41 U/L   ALT 33 0 - 44 U/L   Alkaline Phosphatase 306 (H) 38 - 126 U/L   Total Bilirubin 0.8 0.3 - 1.2 mg/dL   GFR, Estimated 54 (L) >60 mL/min   Anion gap 8 5 - 15  CBC     Status: Abnormal   Collection Time: 08/03/22  5:18 AM  Result Value Ref Range   WBC 9.2 4.0 - 10.5 K/uL   RBC 3.80 (L) 3.87 - 5.11 MIL/uL   Hemoglobin 10.1 (L) 12.0 - 15.0 g/dL   HCT 33.4 (L) 36.0 - 46.0 %   MCV 87.9 80.0 - 100.0 fL   MCH 26.6 26.0 - 34.0 pg   MCHC 30.2 30.0 - 36.0 g/dL   RDW 18.9 (H) 11.5 - 15.5 %   Platelets 122 (L) 150 - 400 K/uL   nRBC 0.0 0.0 - 0.2 %     US Abdomen  Limited RUQ (LIVER/GB)  Result Date: 08/02/2022 CLINICAL DATA:  Biliary dilatation. EXAM: ULTRASOUND ABDOMEN LIMITED RIGHT UPPER QUADRANT COMPARISON:  CT abdomen pelvis dated 08/02/2022. FINDINGS: Evaluation is limited due to overlying bowel gas and body habitus. Gallbladder: The gallbladder is not  visualized. Common bile duct: Diameter: A stent is noted within the common bile duct. The common bile duct is poorly delineated. There is moderate dilatation of the intrahepatic biliary trees better seen on the CT. Liver: The liver is grossly unremarkable. Portal vein is patent on color Doppler imaging with normal direction of blood flow towards the liver. Other: None. IMPRESSION: Moderate dilatation of the intrahepatic biliary trees. A stent is seen within the common bile duct. Electronically Signed   By: Anner Crete M.D.   On: 08/02/2022 22:51   CT Abdomen Pelvis W Contrast  Result Date: 08/02/2022 CLINICAL DATA:  Abdominal pain EXAM: CT ABDOMEN AND PELVIS WITH CONTRAST TECHNIQUE: Multidetector CT imaging of the abdomen and pelvis was performed using the standard protocol following bolus administration of intravenous contrast. RADIATION DOSE REDUCTION: This exam was performed according to the departmental dose-optimization program which includes automated exposure control, adjustment of the mA and/or kV according to patient size and/or use of iterative reconstruction technique. CONTRAST:  120m OMNIPAQUE IOHEXOL 300 MG/ML  SOLN COMPARISON:  01/01/2018 FINDINGS: Lower chest: Coronary artery calcifications are seen. There is peribronchial thickening. There is slight increase in interstitial markings in the lower lung fields. There is no pleural effusion. Hepatobiliary: Gallbladder is not seen. There is moderate to marked dilation of intrahepatic bile ducts. There is biliary stent in the lumen of extrahepatic bile ducts entering the duodenum. Pancreas: There is mild prominence of pancreatic duct. There is 8 mm  fluid density structure in head of the pancreas with no significant interval change. Spleen: Unremarkable. Adrenals/Urinary Tract: Adrenals are unremarkable. There is no hydronephrosis. There are no renal or ureteral stones. There is 1.7 cm cyst in the midportion of right kidney. Urinary bladder is distended. There is no wall thickening in the bladder. Stomach/Bowel: Small hiatal hernia is seen. Stomach is not distended. There is slight prominence of duodenum. There is no significant dilation of jejunum and ileum. Appendix is not seen. Multiple diverticula seen in colon. There is no significant wall thickening in colon. Vascular/Lymphatic: Extensive atherosclerotic plaques and calcifications are seen in aorta and its major branches. There is ectasia of infrarenal abdominal aorta measuring up to 3.1 cm in diameter. Aorta measured 2.7 cm in maximum diameter in the previous study. There is 2.8 cm ectasia in distal abdominal aorta close to its bifurcation. Reproductive: Uterus is unremarkable. There is 2.3 cm fluid density structure in left adnexa which measured 2.7 cm in diameter in the previous study. Other: There is no ascites or pneumoperitoneum. Musculoskeletal: Degenerative changes are noted in lumbar spine with disc space narrowing, bony spurs and encroachment of neural foramina at multiple levels. There is 40-50% decrease in height of body of T12 vertebra which is new. IMPRESSION: There is no evidence of intestinal obstruction or pneumoperitoneum. There is no hydronephrosis. There is moderate to marked dilation of intrahepatic bile ducts. Biliary stent is noted in x-ray body bile ducts. Possibility of malfunction of the stent should be considered. Increased interstitial markings in the lower lung fields may suggest scarring or subsegmental atelectasis. Coronary artery calcifications are seen. Aortic atherosclerosis.  There is 3.1 cm infrarenal aortic aneurysm. Recommend follow-up every 3 years.Reference: J Am  Coll Radiol [id number] There is new 40-50% decrease in height of body of T12 vertebra which may be recent or old. Lumbar spondylosis. Small hiatal hernia. Diverticulosis of colon without signs of focal diverticulitis. Other findings as described in the body of the report. Electronically Signed   By: PPrudy FeelerD.  On: 08/02/2022 20:29   DG Chest Port 1 View  Result Date: 08/02/2022 CLINICAL DATA:  Fever. EXAM: PORTABLE CHEST 1 VIEW COMPARISON:  Jan 23, 2022. FINDINGS: The heart size and mediastinal contours are within normal limits. Both lungs are clear. Old right proximal humeral fracture. IMPRESSION: No active disease. Electronically Signed   By: Marijo Conception M.D.   On: 08/02/2022 17:44    ROS:  As stated above in the HPI otherwise negative.  Blood pressure (!) 130/51, pulse 70, temperature 98.7 F (37.1 C), temperature source Oral, resp. rate 16, height '5\' 2"'$  (1.575 m), weight 43.4 kg, SpO2 98 %.    PE: Gen: NAD, Alert and Oriented HEENT:  [place]/AT, EOMI Neck: Supple, no LAD Lungs: CTA Bilaterally CV: RRR without M/G/R ABD: Soft, NTND, +BS Ext: No C/C/E  Assessment/Plan: 1) Biliary stricture. 2) ? Cholangitis. 3) Fatigue.   The stent should be working well, but there is the possibility that it is excluding some of the other branches of the intrahepatic biliary tract.  Clinically she does have symptoms concerning for an infection.  The pros and cons of a repeat ERCP were discussed and she consents to the repeat ERCP tomorrow.  Brushings will be obtained again and a new stent will be placed.  It is unclear if two stents into the right and left intrahepatic ducts can be placed.  Plan: 1) ERCP tomorrow with stent exchange.  Zebbie Ace D 08/03/2022, 6:38 PM

## 2022-08-03 NOTE — Progress Notes (Signed)
PHARMACY - PHYSICIAN COMMUNICATION CRITICAL VALUE ALERT - BLOOD CULTURE IDENTIFICATION (BCID)  Jasmin Crane is an 88 y.o. female who presented to [facility] on 08/02/2022 with a chief complaint of generalized body aches  Assessment:  One set of blood cultures are positive for Ecoli so far, no resistance genes detected.  Patient with recent biliary procedure, so suspect GI source  Name of physician (or Provider) Contacted: Dr. Pietro Cassis  Current antibiotics: none  Changes to prescribed antibiotics recommended: Start Ceftriaxone 2g IV daily Recommendations accepted by provider  Results for orders placed or performed during the hospital encounter of 08/02/22  Blood Culture ID Panel (Reflexed) (Collected: 08/02/2022  5:15 PM)  Result Value Ref Range   Enterococcus faecalis NOT DETECTED NOT DETECTED   Enterococcus Faecium NOT DETECTED NOT DETECTED   Listeria monocytogenes NOT DETECTED NOT DETECTED   Staphylococcus species NOT DETECTED NOT DETECTED   Staphylococcus aureus (BCID) NOT DETECTED NOT DETECTED   Staphylococcus epidermidis NOT DETECTED NOT DETECTED   Staphylococcus lugdunensis NOT DETECTED NOT DETECTED   Streptococcus species NOT DETECTED NOT DETECTED   Streptococcus agalactiae NOT DETECTED NOT DETECTED   Streptococcus pneumoniae NOT DETECTED NOT DETECTED   Streptococcus pyogenes NOT DETECTED NOT DETECTED   A.calcoaceticus-baumannii NOT DETECTED NOT DETECTED   Bacteroides fragilis NOT DETECTED NOT DETECTED   Enterobacterales DETECTED (A) NOT DETECTED   Enterobacter cloacae complex NOT DETECTED NOT DETECTED   Escherichia coli DETECTED (A) NOT DETECTED   Klebsiella aerogenes NOT DETECTED NOT DETECTED   Klebsiella oxytoca NOT DETECTED NOT DETECTED   Klebsiella pneumoniae NOT DETECTED NOT DETECTED   Proteus species NOT DETECTED NOT DETECTED   Salmonella species NOT DETECTED NOT DETECTED   Serratia marcescens NOT DETECTED NOT DETECTED   Haemophilus influenzae NOT DETECTED NOT  DETECTED   Neisseria meningitidis NOT DETECTED NOT DETECTED   Pseudomonas aeruginosa NOT DETECTED NOT DETECTED   Stenotrophomonas maltophilia NOT DETECTED NOT DETECTED   Candida albicans NOT DETECTED NOT DETECTED   Candida auris NOT DETECTED NOT DETECTED   Candida glabrata NOT DETECTED NOT DETECTED   Candida krusei NOT DETECTED NOT DETECTED   Candida parapsilosis NOT DETECTED NOT DETECTED   Candida tropicalis NOT DETECTED NOT DETECTED   Cryptococcus neoformans/gattii NOT DETECTED NOT DETECTED   CTX-M ESBL NOT DETECTED NOT DETECTED   Carbapenem resistance IMP NOT DETECTED NOT DETECTED   Carbapenem resistance KPC NOT DETECTED NOT DETECTED   Carbapenem resistance NDM NOT DETECTED NOT DETECTED   Carbapenem resist OXA 48 LIKE NOT DETECTED NOT DETECTED   Carbapenem resistance VIM NOT DETECTED NOT DETECTED    [name] 08/03/2022  11:07 AM

## 2022-08-03 NOTE — Consult Note (Signed)
Reason for Consult: Chills, nausea, and known biliary stricture Referring Physician: [facility]  [facility] HPI: This is an 88 year old female with a PMH of HTN, hyperlipidemia, hypothyroidism, and PVD admitted with chills and nausea.  She is s/p a 7 fr x 7 cm plastic biliary stent placement on 07/21/2022 for a proximal extrahepatic biliary stricture.  The stricture exhibited characteristics of being malignant, but the brushings were not conclusive.  Also, on the EUS a clear lesion for FNA was identified.  She tolerated the index ERCP without any issues and she was well until this time.  Tomorrow she was to meet with Dr. Benay Spice to discuss her situation.  Currently she is feeling better, but she is tiring from all the medical appointment and procedures.  Past Medical History:  Diagnosis Date   Arrhythmia    Cancer [facility])    skin cancer 2005   Carotid artery occlusion    Claudication of lower extremity (HCC)    Complication of anesthesia    one bad experience 30-40  yrs ago, since had procedures went okay   Coronary artery disease    Diverticulitis    Hyperlipidemia    Hypertension    Hypothyroidism    Myopathy    Paralysis of tongue    Sick sinus syndrome [facility])     Past Surgical History:  Procedure Laterality Date   APPENDECTOMY     BILIARY BRUSHING  07/21/2022   Procedure: BILIARY BRUSHING;  Surgeon: Carol Ada, MD;  [facility]: [facility];  Service: Gastroenterology;;   BILIARY STENT PLACEMENT [name] 07/21/2022   Procedure: BILIARY STENT PLACEMENT;  Surgeon: Carol Ada, MD;  Location: [facility];  Service: Gastroenterology;  Laterality: [name];   CAROTID ENDARTERECTOMY     CATARACT EXTRACTION, BILATERAL     CESAREAN SECTION     COLONOSCOPY     CORONARY ANGIOPLASTY     CORONARY STENT PLACEMENT     right coronary artery 1998   ENDOSCOPIC RETROGRADE CHOLANGIOPANCREATOGRAPHY (ERCP) WITH PROPOFOL [name] 07/21/2022   Procedure: ENDOSCOPIC RETROGRADE  CHOLANGIOPANCREATOGRAPHY (ERCP) WITH PROPOFOL;  Surgeon: Carol Ada, MD;  Location: [facility];  Service: Gastroenterology;  Laterality: [name];   ESOPHAGOGASTRODUODENOSCOPY (EGD) WITH PROPOFOL [name] 07/21/2022   Procedure: ESOPHAGOGASTRODUODENOSCOPY (EGD) WITH PROPOFOL;  Surgeon: Carol Ada, MD;  Location: [facility];  Service: Gastroenterology;  Laterality: [name];   EUS [name] 07/21/2022   Procedure: UPPER ENDOSCOPIC ULTRASOUND (EUS) LINEAR;  Surgeon: Carol Ada, MD;  Location: [facility];  Service: Gastroenterology;  Laterality: [name];   INGUINAL HERNIA REPAIR Right 02/25/2019   Procedure: RIGHT INGUINAL HERNIA REPAIR WITH MESH;  Surgeon: Donnie Mesa, MD;  Location: [facility];  Service: General;  Laterality: Right;  LMA AND TAP BLOCK   MULTIPLE TOOTH EXTRACTIONS     SALIVARY GLAND SURGERY     removed 1998   SALPINGECTOMY     SKIN CANCER EXCISION     2005   SPHINCTEROTOMY  07/21/2022   Procedure: SPHINCTEROTOMY;  Surgeon: Carol Ada, MD;  Location: [facility];  Service: Gastroenterology;;    Family History  Problem Relation Age of Onset   Cancer Mother    Stroke Mother    Heart disease Father 68       CAD   Heart failure Sister    Arthritis Brother    Lung cancer Brother    Pancreatic cancer Brother    Heart attack Son 68    Social History:  reports that she has quit smoking. Her smoking use included cigarettes. She has  never used smokeless tobacco. She reports current alcohol use. She reports that she does not use drugs.  Allergies:  Allergies  Allergen Reactions   Other Anaphylaxis and Swelling    Scallops   Shellfish Allergy Anaphylaxis and Swelling   Cozaar [Losartan] Nausea Only   Pletaal [Cilostazol] Palpitations    Medications: Scheduled:  aspirin EC  81 mg Oral Daily   citalopram  10 mg Oral Daily   donepezil  10 mg Oral Daily   enoxaparin (LOVENOX) injection  20 mg Subcutaneous Q24H   levothyroxine  88 mcg Oral QHS   lidocaine  1 patch Transdermal Q24H    metoprolol succinate  25 mg Oral Daily   Continuous:  cefTRIAXone (ROCEPHIN)  IV 2 g (08/03/22 1142)    Results for orders placed or performed during the hospital encounter of 08/02/22 (from the past 24 hour(s))  Lactic acid, plasma     Status: None   Collection Time: 08/02/22  8:41 PM  Result Value Ref Range   Lactic Acid, Venous 1.5 0.5 - 1.9 mmol/L  Urinalysis, Routine w reflex microscopic Urine, Clean Catch     Status: Abnormal   Collection Time: 08/02/22  9:19 PM  Result Value Ref Range   Color, Urine STRAW (A) YELLOW   APPearance CLEAR CLEAR   Specific Gravity, Urine 1.016 1.005 - 1.030   pH 7.0 5.0 - 8.0   Glucose, UA 50 (A) NEGATIVE mg/dL   Hgb urine dipstick NEGATIVE NEGATIVE   Bilirubin Urine NEGATIVE NEGATIVE   Ketones, ur NEGATIVE NEGATIVE mg/dL   Protein, ur NEGATIVE NEGATIVE mg/dL   Nitrite NEGATIVE NEGATIVE   Leukocytes,Ua NEGATIVE NEGATIVE  Comprehensive metabolic panel     Status: Abnormal   Collection Time: 08/03/22  5:18 AM  Result Value Ref Range   Sodium 139 135 - 145 mmol/L   Potassium 3.9 3.5 - 5.1 mmol/L   Chloride 106 98 - 111 mmol/L   CO2 25 22 - 32 mmol/L   Glucose, Bld 94 70 - 99 mg/dL   BUN 16 8 - 23 mg/dL   Creatinine, Ser 1.00 0.44 - 1.00 mg/dL   Calcium 8.8 (L) 8.9 - 10.3 mg/dL   Total Protein 6.1 (L) 6.5 - 8.1 g/dL   Albumin 2.4 (L) 3.5 - 5.0 g/dL   AST 56 (H) 15 - 41 U/L   ALT 33 0 - 44 U/L   Alkaline Phosphatase 306 (H) 38 - 126 U/L   Total Bilirubin 0.8 0.3 - 1.2 mg/dL   GFR, Estimated 54 (L) >60 mL/min   Anion gap 8 5 - 15  CBC     Status: Abnormal   Collection Time: 08/03/22  5:18 AM  Result Value Ref Range   WBC 9.2 4.0 - 10.5 K/uL   RBC 3.80 (L) 3.87 - 5.11 MIL/uL   Hemoglobin 10.1 (L) 12.0 - 15.0 g/dL   HCT 33.4 (L) 36.0 - 46.0 %   MCV 87.9 80.0 - 100.0 fL   MCH 26.6 26.0 - 34.0 pg   MCHC 30.2 30.0 - 36.0 g/dL   RDW 18.9 (H) 11.5 - 15.5 %   Platelets 122 (L) 150 - 400 K/uL   nRBC 0.0 0.0 - 0.2 %     US Abdomen  Limited RUQ (LIVER/GB)  Result Date: 08/02/2022 CLINICAL DATA:  Biliary dilatation. EXAM: ULTRASOUND ABDOMEN LIMITED RIGHT UPPER QUADRANT COMPARISON:  CT abdomen pelvis dated 08/02/2022. FINDINGS: Evaluation is limited due to overlying bowel gas and body habitus. Gallbladder: The gallbladder is not  visualized. Common bile duct: Diameter: A stent is noted within the common bile duct. The common bile duct is poorly delineated. There is moderate dilatation of the intrahepatic biliary trees better seen on the CT. Liver: The liver is grossly unremarkable. Portal vein is patent on color Doppler imaging with normal direction of blood flow towards the liver. Other: None. IMPRESSION: Moderate dilatation of the intrahepatic biliary trees. A stent is seen within the common bile duct. Electronically Signed   By: Anner Crete M.D.   On: 08/02/2022 22:51   CT Abdomen Pelvis W Contrast  Result Date: 08/02/2022 CLINICAL DATA:  Abdominal pain EXAM: CT ABDOMEN AND PELVIS WITH CONTRAST TECHNIQUE: Multidetector CT imaging of the abdomen and pelvis was performed using the standard protocol following bolus administration of intravenous contrast. RADIATION DOSE REDUCTION: This exam was performed according to the departmental dose-optimization program which includes automated exposure control, adjustment of the mA and/or kV according to patient size and/or use of iterative reconstruction technique. CONTRAST:  167m OMNIPAQUE IOHEXOL 300 MG/ML  SOLN COMPARISON:  01/01/2018 FINDINGS: Lower chest: Coronary artery calcifications are seen. There is peribronchial thickening. There is slight increase in interstitial markings in the lower lung fields. There is no pleural effusion. Hepatobiliary: Gallbladder is not seen. There is moderate to marked dilation of intrahepatic bile ducts. There is biliary stent in the lumen of extrahepatic bile ducts entering the duodenum. Pancreas: There is mild prominence of pancreatic duct. There is 8 mm  fluid density structure in head of the pancreas with no significant interval change. Spleen: Unremarkable. Adrenals/Urinary Tract: Adrenals are unremarkable. There is no hydronephrosis. There are no renal or ureteral stones. There is 1.7 cm cyst in the midportion of right kidney. Urinary bladder is distended. There is no wall thickening in the bladder. Stomach/Bowel: Small hiatal hernia is seen. Stomach is not distended. There is slight prominence of duodenum. There is no significant dilation of jejunum and ileum. Appendix is not seen. Multiple diverticula seen in colon. There is no significant wall thickening in colon. Vascular/Lymphatic: Extensive atherosclerotic plaques and calcifications are seen in aorta and its major branches. There is ectasia of infrarenal abdominal aorta measuring up to 3.1 cm in diameter. Aorta measured 2.7 cm in maximum diameter in the previous study. There is 2.8 cm ectasia in distal abdominal aorta close to its bifurcation. Reproductive: Uterus is unremarkable. There is 2.3 cm fluid density structure in left adnexa which measured 2.7 cm in diameter in the previous study. Other: There is no ascites or pneumoperitoneum. Musculoskeletal: Degenerative changes are noted in lumbar spine with disc space narrowing, bony spurs and encroachment of neural foramina at multiple levels. There is 40-50% decrease in height of body of T12 vertebra which is new. IMPRESSION: There is no evidence of intestinal obstruction or pneumoperitoneum. There is no hydronephrosis. There is moderate to marked dilation of intrahepatic bile ducts. Biliary stent is noted in x-ray body bile ducts. Possibility of malfunction of the stent should be considered. Increased interstitial markings in the lower lung fields may suggest scarring or subsegmental atelectasis. Coronary artery calcifications are seen. Aortic atherosclerosis.  There is 3.1 cm infrarenal aortic aneurysm. Recommend follow-up every 3 years.Reference: J Am  Coll Radiol [id number] There is new 40-50% decrease in height of body of T12 vertebra which may be recent or old. Lumbar spondylosis. Small hiatal hernia. Diverticulosis of colon without signs of focal diverticulitis. Other findings as described in the body of the report. Electronically Signed   By: PPrudy FeelerD.  On: 08/02/2022 20:29   DG Chest Port 1 View  Result Date: 08/02/2022 CLINICAL DATA:  Fever. EXAM: PORTABLE CHEST 1 VIEW COMPARISON:  Jan 23, 2022. FINDINGS: The heart size and mediastinal contours are within normal limits. Both lungs are clear. Old right proximal humeral fracture. IMPRESSION: No active disease. Electronically Signed   By: Marijo Conception M.D.   On: 08/02/2022 17:44    ROS:  As stated above in the HPI otherwise negative.  Blood pressure (!) 130/51, pulse 70, temperature 98.7 F (37.1 C), temperature source Oral, resp. rate 16, height '5\' 2"'$  (1.575 m), weight 43.4 kg, SpO2 98 %.    PE: Gen: NAD, Alert and Oriented HEENT:  [place]/AT, EOMI Neck: Supple, no LAD Lungs: CTA Bilaterally CV: RRR without M/G/R ABD: Soft, NTND, +BS Ext: No C/C/E  Assessment/Plan: 1) Biliary stricture. 2) ? Cholangitis. 3) Fatigue.   The stent should be working well, but there is the possibility that it is excluding some of the other branches of the intrahepatic biliary tract.  Clinically she does have symptoms concerning for an infection.  The pros and cons of a repeat ERCP were discussed and she consents to the repeat ERCP tomorrow.  Brushings will be obtained again and a new stent will be placed.  It is unclear if two stents into the right and left intrahepatic ducts can be placed.  Plan: 1) ERCP tomorrow with stent exchange.  Esthela Brandner D 08/03/2022, 6:38 PM

## 2022-08-04 ENCOUNTER — Inpatient Hospital Stay (HOSPITAL_COMMUNITY): Payer: Medicare Other | Admitting: Anesthesiology

## 2022-08-04 ENCOUNTER — Encounter (HOSPITAL_COMMUNITY)
Admission: EM | Disposition: A | Discharge status: Home Health | Payer: Self-pay | Source: Home / Self Care | Attending: Internal Medicine

## 2022-08-04 ENCOUNTER — Inpatient Hospital Stay: Payer: Medicare Other | Admitting: Oncology

## 2022-08-04 ENCOUNTER — Encounter (HOSPITAL_COMMUNITY): Payer: Self-pay | Admitting: Internal Medicine

## 2022-08-04 ENCOUNTER — Inpatient Hospital Stay (HOSPITAL_COMMUNITY): Payer: Medicare Other

## 2022-08-04 DIAGNOSIS — Z4659 Encounter for fitting and adjustment of other gastrointestinal appliance and device: Secondary | ICD-10-CM | POA: Diagnosis not present

## 2022-08-04 DIAGNOSIS — Z87891 Personal history of nicotine dependence: Secondary | ICD-10-CM

## 2022-08-04 DIAGNOSIS — K831 Obstruction of bile duct: Secondary | ICD-10-CM | POA: Diagnosis not present

## 2022-08-04 DIAGNOSIS — J449 Chronic obstructive pulmonary disease, unspecified: Secondary | ICD-10-CM

## 2022-08-04 HISTORY — PX: STENT REMOVAL: SHX6421

## 2022-08-04 HISTORY — PX: BILIARY STENT PLACEMENT: SHX5538

## 2022-08-04 HISTORY — PX: ERCP: SHX5425

## 2022-08-04 HISTORY — PX: BILIARY BRUSHING: SHX6843

## 2022-08-04 LAB — CBC WITH DIFFERENTIAL/PLATELET
Abs Immature Granulocytes: 0.02 10*3/uL (ref 0.00–0.07)
Basophils Absolute: 0.1 10*3/uL (ref 0.0–0.1)
Basophils Relative: 1 %
Eosinophils Absolute: 0.1 10*3/uL (ref 0.0–0.5)
Eosinophils Relative: 2 %
HCT: 32.7 % — ABNORMAL LOW (ref 36.0–46.0)
Hemoglobin: 9.8 g/dL — ABNORMAL LOW (ref 12.0–15.0)
Immature Granulocytes: 0 %
Lymphocytes Relative: 17 %
Lymphs Abs: 1 10*3/uL (ref 0.7–4.0)
MCH: 26.7 pg (ref 26.0–34.0)
MCHC: 30 g/dL (ref 30.0–36.0)
MCV: 89.1 fL (ref 80.0–100.0)
Monocytes Absolute: 0.5 10*3/uL (ref 0.1–1.0)
Monocytes Relative: 9 %
Neutro Abs: 3.9 10*3/uL (ref 1.7–7.7)
Neutrophils Relative %: 71 %
Platelets: 101 10*3/uL — ABNORMAL LOW (ref 150–400)
RBC: 3.67 MIL/uL — ABNORMAL LOW (ref 3.87–5.11)
RDW: 18.9 % — ABNORMAL HIGH (ref 11.5–15.5)
WBC: 5.6 10*3/uL (ref 4.0–10.5)
nRBC: 0 % (ref 0.0–0.2)

## 2022-08-04 LAB — COMPREHENSIVE METABOLIC PANEL
ALT: 22 U/L (ref 0–44)
AST: 30 U/L (ref 15–41)
Albumin: 2.4 g/dL — ABNORMAL LOW (ref 3.5–5.0)
Alkaline Phosphatase: 243 U/L — ABNORMAL HIGH (ref 38–126)
Anion gap: 7 (ref 5–15)
BUN: 14 mg/dL (ref 8–23)
CO2: 25 mmol/L (ref 22–32)
Calcium: 8.3 mg/dL — ABNORMAL LOW (ref 8.9–10.3)
Chloride: 101 mmol/L (ref 98–111)
Creatinine, Ser: 0.97 mg/dL (ref 0.44–1.00)
GFR, Estimated: 56 mL/min — ABNORMAL LOW (ref >60)
Glucose, Bld: 86 mg/dL (ref 70–99)
Potassium: 3.6 mmol/L (ref 3.5–5.1)
Sodium: 133 mmol/L — ABNORMAL LOW (ref 135–145)
Total Bilirubin: 0.7 mg/dL (ref 0.3–1.2)
Total Protein: 5.3 g/dL — ABNORMAL LOW (ref 6.5–8.1)

## 2022-08-04 LAB — LIPASE, BLOOD: Lipase: 48 U/L (ref 11–51)

## 2022-08-04 SURGERY — ERCP, WITH INTERVENTION IF INDICATED
Anesthesia: General

## 2022-08-04 MED ORDER — LACTATED RINGERS IV SOLN: INTRAVENOUS | Status: DC | PRN

## 2022-08-04 MED ORDER — PROPOFOL 10 MG/ML IV BOLUS
INTRAVENOUS | Status: DC | PRN
  Administered 2022-08-04: 40 mg via INTRAVENOUS

## 2022-08-04 MED ORDER — SODIUM CHLORIDE 0.9 % IV SOLN
INTRAVENOUS | Status: DC | PRN
  Administered 2022-08-04: 25 mL

## 2022-08-04 MED ORDER — ONDANSETRON HCL 4 MG/2ML IJ SOLN
INTRAMUSCULAR | Status: DC | PRN
  Administered 2022-08-04: 4 mg via INTRAVENOUS

## 2022-08-04 MED ORDER — PROPOFOL 10 MG/ML IV BOLUS
INTRAVENOUS | Status: AC
  Filled 2022-08-04: qty 20

## 2022-08-04 MED ORDER — FENTANYL CITRATE (PF) 100 MCG/2ML IJ SOLN
INTRAMUSCULAR | Status: DC | PRN
  Administered 2022-08-04: 25 ug via INTRAVENOUS

## 2022-08-04 MED ORDER — PHENYLEPHRINE HCL (PRESSORS) 10 MG/ML IV SOLN
INTRAVENOUS | Status: DC | PRN
  Administered 2022-08-04 (×2): 80 ug via INTRAVENOUS

## 2022-08-04 MED ORDER — PROPOFOL 1000 MG/100ML IV EMUL
INTRAVENOUS | Status: AC
  Filled 2022-08-04: qty 100

## 2022-08-04 MED ORDER — LIDOCAINE HCL (CARDIAC) PF 100 MG/5ML IV SOSY
PREFILLED_SYRINGE | INTRAVENOUS | Status: DC | PRN
  Administered 2022-08-04: 40 mg via INTRAVENOUS

## 2022-08-04 MED ORDER — SUGAMMADEX SODIUM 500 MG/5ML IV SOLN
INTRAVENOUS | Status: DC | PRN
  Administered 2022-08-04: 200 mg via INTRAVENOUS

## 2022-08-04 MED ORDER — GLUCAGON HCL RDNA (DIAGNOSTIC) 1 MG IJ SOLR
INTRAMUSCULAR | Status: AC
  Filled 2022-08-04: qty 1

## 2022-08-04 MED ORDER — DICLOFENAC SUPPOSITORY 100 MG
RECTAL | Status: AC
  Filled 2022-08-04: qty 1

## 2022-08-04 MED ORDER — ROCURONIUM BROMIDE 100 MG/10ML IV SOLN
INTRAVENOUS | Status: DC | PRN
  Administered 2022-08-04: 50 mg via INTRAVENOUS

## 2022-08-04 MED ORDER — DICLOFENAC SUPPOSITORY 100 MG
RECTAL | Status: DC | PRN
  Administered 2022-08-04: 100 mg via RECTAL

## 2022-08-04 MED ORDER — DEXAMETHASONE SODIUM PHOSPHATE 10 MG/ML IJ SOLN
INTRAMUSCULAR | Status: DC | PRN
  Administered 2022-08-04: 4 mg via INTRAVENOUS

## 2022-08-04 MED ORDER — FENTANYL CITRATE (PF) 100 MCG/2ML IJ SOLN
INTRAMUSCULAR | Status: AC
  Filled 2022-08-04: qty 2

## 2022-08-04 NOTE — Anesthesia Preprocedure Evaluation (Signed)
Anesthesia Evaluation  Patient identified by MRN, date of birth, ID band Patient awake    Reviewed: Allergy & Precautions, NPO status , Patient's Chart, lab work & pertinent test results, reviewed documented beta blocker date and time   Airway Mallampati: II  TM Distance: >3 FB Neck ROM: Full    Dental  (+) Edentulous Upper, Missing, Dental Advisory Given,    Pulmonary COPD, former smoker   Pulmonary exam normal breath sounds clear to auscultation       Cardiovascular hypertension, Pt. on home beta blockers and Pt. on medications + CAD, + Peripheral Vascular Disease and +CHF  Normal cardiovascular exam Rhythm:Regular Rate:Normal  AAA  TTE 2022  1. Left ventricular ejection fraction, by estimation, is 55 to 60%. The  left ventricle has normal function. The left ventricle has no regional  wall motion abnormalities. There is mild left ventricular hypertrophy.  Left ventricular diastolic parameters  are consistent with Grade II diastolic dysfunction (pseudonormalization).   2. Right ventricular systolic function is mildly reduced. The right  ventricular size is normal. There is normal pulmonary artery systolic  pressure. The estimated right ventricular systolic pressure is 29.7 mmHg.   3. Left atrial size was mildly dilated.   4. The mitral valve is normal in structure. Trivial mitral valve  regurgitation. No evidence of mitral stenosis.   5. The aortic valve is tricuspid. Aortic valve regurgitation is mild.  Mild aortic valve sclerosis is present, with no evidence of aortic valve  stenosis. Aortic valve area, by VTI measures 1.91 cm. Aortic valve mean  gradient measures 10.0 mmHg.   6. The inferior vena cava is normal in size with greater than 50%  respiratory variability, suggesting right atrial pressure of 3 mmHg.     Neuro/Psych  PSYCHIATRIC DISORDERS     Dementia negative neurological ROS     GI/Hepatic negative GI ROS,  Neg liver ROS,,,  Endo/Other  Hypothyroidism    Renal/GU negative Renal ROS  negative genitourinary   Musculoskeletal negative musculoskeletal ROS (+)    Abdominal   Peds  Hematology  (+) Blood dyscrasia, anemia Lab Results      Component                Value               Date                      WBC                      5.6                 08/04/2022                HGB                      9.8 (L)             08/04/2022                HCT                      32.7 (L)            08/04/2022                MCV  89.1                08/04/2022                PLT                      101 (L)             08/04/2022              Anesthesia Other Findings   Reproductive/Obstetrics                             Anesthesia Physical Anesthesia Plan  ASA: 3  Anesthesia Plan: General   Post-op Pain Management:    Induction: Intravenous  PONV Risk Score and Plan: 3 and Dexamethasone, Ondansetron and Treatment may vary due to age or medical condition  Airway Management Planned: Oral ETT  Additional Equipment:   Intra-op Plan:   Post-operative Plan: Extubation in OR  Informed Consent: I have reviewed the patients History and Physical, chart, labs and discussed the procedure including the risks, benefits and alternatives for the proposed anesthesia with the patient or authorized representative who has indicated his/her understanding and acceptance.     Dental advisory given  Plan Discussed with: CRNA  Anesthesia Plan Comments:        Anesthesia Quick Evaluation

## 2022-08-04 NOTE — Op Note (Addendum)
[facility] Patient Name: [facility] Procedure Date: 08/04/2022 MRN: [id number] Attending MD: Carol Ada , MD, [id number] Date of Birth: 23-Mar-1932 CSN: [id number] Age: 88 Admit Type: Inpatient Procedure:                ERCP Indications:              Malignant Bismuth type I stricture (limited to the                            common hepatic duct distal to the confluence of the                            right and left hepatic ducts) Providers:                Carol Ada, MD, Carlyn Reichert, RN, Fransico Setters                            [place], Technician Referring MD:              Medicines:                General Anesthesia Complications:            No immediate complications. Estimated Blood Loss:     Estimated blood loss was minimal. Procedure:                Pre-Anesthesia Assessment:                           - Prior to the procedure, a History and Physical                            was performed, and patient medications and                            allergies were reviewed. The patient's tolerance of                            previous anesthesia was also reviewed. The risks                            and benefits of the procedure and the sedation                            options and risks were discussed with the patient.                            All questions were answered, and informed consent                            was obtained. Prior Anticoagulants: The patient has                            taken no anticoagulant or antiplatelet agents. ASA  Grade Assessment: III - A patient with severe                            systemic disease. After reviewing the risks and                            benefits, the patient was deemed in satisfactory                            condition to undergo the procedure.                           - Sedation was administered by an anesthesia                            professional. General  anesthesia was attained.                           After obtaining informed consent, the scope was                            passed under direct vision. Throughout the                            procedure, the patient's blood pressure, pulse, and                            oxygen saturations were monitored continuously. The                            [id number]-[id number] ([id number] Olympus duodenoscope was                            introduced through the mouth, and used to inject                            contrast into and used to inject contrast into the                            bile duct. The ERCP was accomplished without                            difficulty. The patient tolerated the procedure                            well. Scope In: Scope Out: Findings:      A biliary stent was visible on the scout film. The esophagus was       successfully intubated under direct vision. The scope was advanced to a       normal major papilla in the descending duodenum without detailed       examination of the pharynx, larynx and associated structures, and upper       GI tract. The upper GI tract was grossly normal. The bile duct was  deeply cannulated with the short-nosed traction sphincterotome. Contrast       was injected. I personally interpreted the bile duct images. There was       brisk flow of contrast through the ducts. Image quality was excellent.       Contrast extended to the entire biliary tree. The common bile duct and       common hepatic duct contained a single segmental stenosis 30 mm in       length. A short 0.035 inch Soft Jagwire was passed into the biliary       tree. Cells for cytology were obtained by brushing in the upper third of       the main bile duct. One stent was removed from the common hepatic duct       using a snare and sent for cytology. One 7 Fr by 9 cm transpapillary       plastic stent with a single external flap and a single internal flap was       placed 7.5 cm  into the common hepatic duct. Clear fluid flowed through       the stent. The stent was in good position.      Initial visualization of the first stent showed that it slipped out of       position, but it did not appear to be below the stricture by       fluoroscopy. There was evidence of some bile and sludge in the stent.       The stent was removed with a snare and collected in a specimen jar for       cytology. The CBD was easily cannulated and the guidewire preferentially       went into the left intrahepatic ducts. Two brushinngs, with two separate       brushes were used for cystology. A second 7 Fr x 9 cm stent was again       inserted, but this time it went towards the left intrahepatic ducts.       There was some clear fluid drainage from the stent. Fluoroscopy showed       that it was proximal to the stricture. Impression:               - A single segmental biliary stricture was found in                            the common bile duct and common hepatic duct. The                            stricture was malignant appearing.                           - Cells for cytology obtained in the upper third of                            the main bile duct.                           - One stent was removed from the common hepatic                            duct.                           -  One plastic stent was placed into the common                            hepatic duct. Moderate Sedation:      Not Applicable - Patient had care per Anesthesia. Recommendation:           - Return patient to hospital ward for ongoing care.                           - Resume regular diet.                           - If there is any evidence of obstruction again, a                            PTC with IR will need to be considered. Procedure Code(s):        --- Professional ---                           [phone], Endoscopic retrograde                            cholangiopancreatography (ERCP); with removal and                             exchange of stent(s), biliary or pancreatic duct,                            including pre- and post-dilation and guide wire                            passage, when performed, including sphincterotomy,                            when performed, each stent exchanged                           [phone], Endoscopic catheterization of the biliary                            ductal system, radiological supervision and                            interpretation Diagnosis Code(s):        --- Professional ---                           K83.1, Obstruction of bile duct                           Z46.59, Encounter for fitting and adjustment of                            other gastrointestinal appliance and device CPT copyright 2022 [employer]. All rights reserved. The codes documented in this report are preliminary and upon coder review  may  be revised to meet current compliance requirements. Carol Ada, MD Carol Ada, MD 08/04/2022 9:00:48 AM This report has been signed electronically. Number of Addenda: 0

## 2022-08-04 NOTE — Progress Notes (Signed)
Patient had a bowel movement and it caused her to have bleeding bright red blood from the rectum. MD is aware. Gi notified.

## 2022-08-04 NOTE — Progress Notes (Signed)
PROGRESS NOTE  Jasmin Crane  DOB: [birth year]  PCP: Hayden Rasmussen, MD JSH:[id number]  DOA: 08/02/2022  LOS: 1 day  Hospital Day: 3  Brief narrative: Jasmin Crane is a 88 y.o. female with PMH significant for HTN, HLD, CAD/stent, sick sinus syndrome, PVD, carotid artery stenosis, infrarenal AAA, hypothyroidism who is a resident at [facility]. 12/6, patient was brought to the ED for evaluation of pain rigors. Patient recently underwent EUS followed by ERCP on 07/21/2022 by Dr. Benson Norway.  She was found to have a malignant appearing biliary stricture with dilated hepatic ducts with a mass causing obstruction.  Biliary sphincterotomy was performed and 1 plastic stent was placed into the common bile duct.  CBD brushing cytology showed atypical cells and no malignant cells were identified. Patient states she developed rigors, cold sweats, nausea without emesis earlier on the day of presentation.  She has had intermittent right flank and RUQ abdominal pain more noticeable at night and not correlated with meals. She reports recent constipation as well.  CT abdomen/pelvis with contrast showed dilation of intrahepatic bile ducts, biliary stent noted with possibility of malfunction of the stent.   Seen by GI.  Her biliary stent was replaced on 08/04/2022.  08/04/2022: The patient was seen and examined with her daughter at her bedside.  Denies any abdominal pain at the time of this visit.  Blood cultures positive for E. coli and Klebsiella species, sensitivities are pending.  Assessment and plan: Malfunction of biliary stent, replaced on 08/04/2022. s/p recent ERCP s/p sphincterotomy and biliary stent placement -11/24 Findings were concerning for malignant appearing biliary stricture and obstructing mass.  Cytology unrevealing.  ERCP repeated on 08/04/2022, biliary stent replaced.  Appreciate GIs assistance.  E. coli and Klebsiella species bacteremia, POA Noted in blood cultures sent on admission.   Discussed with pharmacy.   Currently on Rocephin  Follow sensitivities. Repeat blood cultures peripherally x 2 on 08/05/2022.  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   Plan to follow-up with Dr. Benay Spice.  CAD/HLD Aortic atherosclerosis Infrarenal AAA No anginal symptoms currently Continue Toprol and aspirin Statin on hold   Essential hypertension Continue Toprol-XL.  Hypothyroidism Continue Synthroid.    Goals of care   Code Status: DNR    Mobility: Encourage ambulation.  Infusions:   cefTRIAXone (ROCEPHIN)  IV 2 g (08/04/22 1213)    Scheduled Meds:  aspirin EC  81 mg Oral Daily   citalopram  10 mg Oral Daily   donepezil  10 mg Oral Daily   enoxaparin (LOVENOX) injection  20 mg Subcutaneous Q24H   levothyroxine  88 mcg Oral QHS   lidocaine  1 patch Transdermal Q24H   metoprolol succinate  25 mg Oral Daily    PRN meds: ondansetron **OR** ondansetron (ZOFRAN) IV, senna-docusate   Skin assessment:     Nutritional status:  Body mass index is 17.5 kg/m.          Diet:  Diet Order             Diet regular Room service appropriate? Yes; Fluid consistency: Thin  Diet effective now                   DVT prophylaxis: Lovenox subcu   Antimicrobials: IV Rocephin Fluid: None Consultants: GI Family Communication: Daughter at bedside  Status is: Observation  Continue in-hospital care because: Pending procedure, also has bacteremia Level of care: Med-Surg   Dispo: The  patient is from: SNF              Anticipated d/c is to: Likely back to SNF once sensitivities have resulted.              Patient currently is not medically stable to d/c.   Difficult to place patient No       Antimicrobials: Anti-infectives (From admission, onward)    Start     Dose/Rate Route Frequency Ordered Stop   08/03/22 1200  cefTRIAXone (ROCEPHIN) 2 g in sodium chloride  0.9 % 100 mL IVPB        2 g 200 mL/hr over 30 Minutes Intravenous Every 24 hours 08/03/22 1105         Objective: Vitals:   08/04/22 1119 08/04/22 1257  BP: (!) 102/55 (!) 109/55  Pulse: 65 64  Resp: 17 17  Temp: 97.8 F (36.6 C) 97.6 F (36.4 C)  SpO2: 90% 94%    Intake/Output Summary (Last 24 hours) at 08/04/2022 1553 Last data filed at 08/04/2022 1300 Gross per 24 hour  Intake 1040 ml  Output 450 ml  Net 590 ml   Filed Weights   08/02/22 1708 08/04/22 0733  Weight: 43.4 kg 43.4 kg   Weight change:  Body mass index is 17.5 kg/m.   Physical Exam: General exam: Frail appearing in no acute distress.  She is alert and interactive.   Skin: No rashes, lesions or ulcers. HEENT: Atraumatic, normocephalic, no obvious bleeding Lungs: Clear to auscultation no wheezes or rales.   CVS: Regular rate and rhythm no rubs or gallops. GI/Abd soft, mild tenderness in right upper quadrant, nondistended, bowel sound present CNS: Alert, awake, oriented x 3 Psychiatry: Mood is appropriate. Extremities: No lower extremity edema bilaterally.  Data Review: I have personally reviewed the laboratory data and studies available.  F/u labs ordered Unresulted Labs (From admission, onward)     Start     Ordered   08/04/22 0500  Cancer antigen 19-9  Tomorrow morning,   R        08/03/22 1656            Signed, Kayleen Memos, MD [facility] 08/04/2022

## 2022-08-04 NOTE — Transfer of Care (Signed)
Immediate Anesthesia Transfer of Care Note  Patient: Jasmin Crane  Procedure(s) Performed: ENDOSCOPIC RETROGRADE CHOLANGIOPANCREATOGRAPHY (ERCP) BILIARY BRUSHING STENT REMOVAL BILIARY STENT PLACEMENT  Patient Location: Endoscopy Unit  Anesthesia Type:General  Level of Consciousness: awake and patient cooperative  Airway & Oxygen Therapy: Patient Spontanous Breathing and Patient connected to nasal cannula oxygen  Post-op Assessment: Report given to RN and Post -op Vital signs reviewed and stable  Post vital signs: Reviewed and stable  Last Vitals:  Vitals Value Taken Time  BP    Temp    Pulse 73 08/04/22 0855  Resp 17 08/04/22 0855  SpO2 97 % 08/04/22 0855  Vitals shown include unvalidated device data.  Last Pain:  Vitals:   08/04/22 0733  TempSrc: Tympanic  PainSc: 3          Complications: No notable events documented.

## 2022-08-04 NOTE — Interval H&P Note (Signed)
History and Physical Interval Note:  08/04/2022 7:25 AM  Jasmin Crane  has presented today for surgery, with the diagnosis of Biliary stricture.  The various methods of treatment have been discussed with the patient and family. After consideration of risks, benefits and other options for treatment, the patient has consented to  Procedure(s): ENDOSCOPIC RETROGRADE CHOLANGIOPANCREATOGRAPHY (ERCP) ([name]) as a surgical intervention.  The patient's history has been reviewed, patient examined, no change in status, stable for surgery.  I have reviewed the patient's chart and labs.  Questions were answered to the patient's satisfaction.     Harly Pipkins D

## 2022-08-04 NOTE — Progress Notes (Signed)
PHARMACY - PHYSICIAN COMMUNICATION CRITICAL VALUE ALERT - BLOOD CULTURE IDENTIFICATION (BCID)  Jasmin Crane is an 88 y.o. female who presented to [facility] on 08/02/2022 with a chief complaint of generalized body aches  Assessment:  One set of blood cultures are positive for Ecoli so far, no resistance genes detected.  Patient with recent biliary procedure, so suspect GI source -12/8 update: Micro lab called to report that blood culture now also growing Klebsiella species in addition to E.coli.   Name of physician (or Provider) Contacted: Dr. [place] Crane  Current antibiotics: Ceftriaxone 2 g IV q24h  Changes to prescribed antibiotics recommended: Continue ceftriaxone 2 g IV q24h  Results for orders placed or performed during the hospital encounter of 08/02/22  Blood Culture ID Panel (Reflexed) (Collected: 08/02/2022  5:15 PM)  Result Value Ref Range   Enterococcus faecalis NOT DETECTED NOT DETECTED   Enterococcus Faecium NOT DETECTED NOT DETECTED   Listeria monocytogenes NOT DETECTED NOT DETECTED   Staphylococcus species NOT DETECTED NOT DETECTED   Staphylococcus aureus (BCID) NOT DETECTED NOT DETECTED   Staphylococcus epidermidis NOT DETECTED NOT DETECTED   Staphylococcus lugdunensis NOT DETECTED NOT DETECTED   Streptococcus species NOT DETECTED NOT DETECTED   Streptococcus agalactiae NOT DETECTED NOT DETECTED   Streptococcus pneumoniae NOT DETECTED NOT DETECTED   Streptococcus pyogenes NOT DETECTED NOT DETECTED   A.calcoaceticus-baumannii NOT DETECTED NOT DETECTED   Bacteroides fragilis NOT DETECTED NOT DETECTED   Enterobacterales DETECTED (A) NOT DETECTED   Enterobacter cloacae complex NOT DETECTED NOT DETECTED   Escherichia coli DETECTED (A) NOT DETECTED   Klebsiella aerogenes NOT DETECTED NOT DETECTED   Klebsiella oxytoca NOT DETECTED NOT DETECTED   Klebsiella pneumoniae NOT DETECTED NOT DETECTED   Proteus species NOT DETECTED NOT DETECTED   Salmonella species NOT DETECTED NOT  DETECTED   Serratia marcescens NOT DETECTED NOT DETECTED   Haemophilus influenzae NOT DETECTED NOT DETECTED   Neisseria meningitidis NOT DETECTED NOT DETECTED   Pseudomonas aeruginosa NOT DETECTED NOT DETECTED   Stenotrophomonas maltophilia NOT DETECTED NOT DETECTED   Candida albicans NOT DETECTED NOT DETECTED   Candida auris NOT DETECTED NOT DETECTED   Candida glabrata NOT DETECTED NOT DETECTED   Candida krusei NOT DETECTED NOT DETECTED   Candida parapsilosis NOT DETECTED NOT DETECTED   Candida tropicalis NOT DETECTED NOT DETECTED   Cryptococcus neoformans/gattii NOT DETECTED NOT DETECTED   CTX-M ESBL NOT DETECTED NOT DETECTED   Carbapenem resistance IMP NOT DETECTED NOT DETECTED   Carbapenem resistance KPC NOT DETECTED NOT DETECTED   Carbapenem resistance NDM NOT DETECTED NOT DETECTED   Carbapenem resist OXA 48 LIKE NOT DETECTED NOT DETECTED   Carbapenem resistance VIM NOT DETECTED NOT DETECTED    Lenis Noon, PharmD 08/04/2022  1:30 PM

## 2022-08-04 NOTE — Evaluation (Signed)
Physical Therapy Evaluation Patient Details Name: Jasmin Crane MRN: [id number] DOB: [birth year] Today's Date: 08/04/2022  History of Present Illness  Pt is an 88 y.o. female admitted for malfunction of biliary stent-replaced on 08/04/2022. s/p recent ERCP s/p sphincterotomy and biliary stent placement -11/24. Findings were concerning for malignant appearing biliary stricture and obstructing mass  Clinical Impression  Pt is an 88 y.o. female with above HPI resulting in the deficits listed below (see PT Problem List). Per daughter, pt receives assist with medication management, dressing intermittently, and cleaning from staff. Recently moved to ILF at [facility] (about 5 days ago). Pt performed sit to stand transfers with MIN guard for safety and cues for safe hand placement. Pt ambulated 6f, 578fwith Min A without AD and CGA with use of RW. Pt will benefit from skilled PT to maximize functional mobility to increase independence.         Recommendations for follow up therapy are one component of a multi-disciplinary discharge planning process, led by the attending physician.  Recommendations may be updated based on patient status, additional functional criteria and insurance authorization.  Follow Up Recommendations Home health PT      Assistance Recommended at Discharge Frequent or constant Supervision/Assistance  Patient can return home with the following  A little help with walking and/or transfers;A little help with bathing/dressing/bathroom;Help with stairs or ramp for entrance;Assistance with cooking/housework    Equipment Recommendations None recommended by PT (pt owns RW)  Recommendations for Other Services       Functional Status Assessment Patient has had a recent decline in their functional status and demonstrates the ability to make significant improvements in function in a reasonable and predictable amount of time.     Precautions / Restrictions  Precautions Precautions: Fall Restrictions Weight Bearing Restrictions: No      Mobility  Bed Mobility Overal bed mobility: Needs Assistance Bed Mobility: Supine to Sit     Supine to sit: Supervision     General bed mobility comments: HOB slightly elevated    Transfers Overall transfer level: Needs assistance Equipment used: Rolling walker (2 wheels) Transfers: Sit to/from Stand Sit to Stand: Min guard           General transfer comment: x2; cues for hand placement    Ambulation/Gait Ambulation/Gait assistance: Min guard Gait Distance (Feet): 14 Feet Assistive device: Rolling walker (2 wheels), None Gait Pattern/deviations: Step-through pattern, Decreased stride length Gait velocity: decreased     General Gait Details: ambulated 1475fo bathroom and then additional 63f87fse of RW and no AD, MIN A without use of AD and noted furniture reaching. CGA with use of RW. Educated pt on improved stabilty with use of AD with goal of maintaining independence.  Stairs            Wheelchair Mobility    Modified Rankin (Stroke Patients Only)       Balance Overall balance assessment: Mild deficits observed, not formally tested                                           Pertinent Vitals/Pain Pain Assessment Pain Assessment: No/denies pain    Home Living Family/patient expects to be discharged to:: Private residence Living Arrangements: Alone (ILF) Available Help at Discharge: Other (Comment) (staff at ILF)[place] [facility]: Independent living facility         Home Layout:  One level Home Equipment: Conservation officer, nature (2 wheels);Shower seat Additional Comments: just started transition to ILF at [facility] past 5 days, prior to that was with daughter  for 44month after fall in May.    Prior Function Prior Level of Function : Needs assist             Mobility Comments: fall in May trip on steps ADLs Comments: daughter reports staff has  been helping with dressing, cleaning, med management.     Hand Dominance   Dominant Hand: Right    Extremity/Trunk Assessment   Upper Extremity Assessment Upper Extremity Assessment: Generalized weakness    Lower Extremity Assessment Lower Extremity Assessment: Generalized weakness    Cervical / Trunk Assessment Cervical / Trunk Assessment: Normal  Communication   Communication: No difficulties  Cognition Arousal/Alertness: Awake/alert Behavior During Therapy: WFL for tasks assessed/performed Overall Cognitive Status: Within Functional Limits for tasks assessed                                          General Comments General comments (skin integrity, edema, etc.): pt on 2L [place] upon entry with O2 97%, desat on RA to 86-87%, able to recover to 93% at end of session on RA with seated rest. Vital 91/41, 63bpm in supine at beginning of session. 94/41, 66bpm at end of session in chair. Pt denied any dizziness/lightheadeness throughout. Daughter reports BP is low for pt's typical baseline. Rn notified tha pt still with blood per rectum observed when changing brief and with pericare.    Exercises     Assessment/Plan    PT Assessment Patient needs continued PT services  PT Problem List Decreased strength;Decreased activity tolerance;Decreased balance;Decreased mobility;Decreased knowledge of use of DME;Decreased safety awareness       PT Treatment Interventions DME instruction;Gait training;Functional mobility training;Therapeutic activities;Therapeutic exercise;Balance training;Patient/family education    PT Goals (Current goals can be found in the Care Plan section)  Acute Rehab PT Goals Patient Stated Goal: stay independent- daughter wants to continue PT PT Goal Formulation: With patient/family Time For Goal Achievement: 08/18/22 Potential to Achieve Goals: Good    Frequency Min 3X/week     Co-evaluation               AM-PAC PT "6 Clicks"  Mobility  Outcome Measure Help needed turning from your back to your side while in a flat bed without using bedrails?: A Little Help needed moving from lying on your back to sitting on the side of a flat bed without using bedrails?: A Little Help needed moving to and from a bed to a chair (including a wheelchair)?: A Little Help needed standing up from a chair using your arms (e.g., wheelchair or bedside chair)?: A Little Help needed to walk in hospital room?: A Little Help needed climbing 3-5 steps with a railing? : A Little 6 Click Score: 18    End of Session Equipment Utilized During Treatment: Gait belt Activity Tolerance: Patient tolerated treatment well Patient left: in chair;with call bell/phone within reach;with chair alarm set;with family/visitor present Nurse Communication: Mobility status;Other (comment) (vitals during session) PT Visit Diagnosis: Unsteadiness on feet (R26.81);Muscle weakness (generalized) (M62.81)    Time: [address]PT Time Calculation (min) (ACUTE ONLY): 33 min   Charges:   PT Evaluation $PT Eval Low Complexity: 1 Low PT Treatments $Therapeutic Activity: 8-22 mins  Festus Barren PT, DPT  Acute Rehabilitation Services  Office [phone]  08/04/2022, 5:36 PM

## 2022-08-04 NOTE — Anesthesia Procedure Notes (Signed)
Procedure Name: Intubation Date/Time: 08/04/2022 8:10 AM  Performed by: Jonna Munro, CRNAPre-anesthesia Checklist: Patient identified, Emergency Drugs available, Suction available, Patient being monitored and Timeout performed Patient Re-evaluated:Patient Re-evaluated prior to induction Oxygen Delivery Method: Circle system utilized Preoxygenation: Pre-oxygenation with 100% oxygen Induction Type: IV induction Laryngoscope Size: Mac and 3 Grade View: Grade I Tube type: Oral Tube size: 7.0 mm Number of attempts: 1 Airway Equipment and Method: Stylet Placement Confirmation: ETT inserted through vocal cords under direct vision, positive ETCO2, CO2 detector and breath sounds checked- equal and bilateral Secured at: 22 cm Tube secured with: Tape Dental Injury: Teeth and Oropharynx as per pre-operative assessment

## 2022-08-05 DIAGNOSIS — K831 Obstruction of bile duct: Secondary | ICD-10-CM | POA: Diagnosis not present

## 2022-08-05 LAB — CULTURE, BLOOD (ROUTINE X 2): Special Requests: ADEQUATE

## 2022-08-05 LAB — CANCER ANTIGEN 19-9: CA 19-9: 137 U/mL — ABNORMAL HIGH (ref 0–35)

## 2022-08-05 MED ORDER — AMOXICILLIN-POT CLAVULANATE 875-125 MG PO TABS: 1.0000 | ORAL_TABLET | Freq: Two times a day (BID) | ORAL | Status: DC

## 2022-08-05 MED ORDER — SACCHAROMYCES BOULARDII 250 MG PO CAPS: 250.0000 mg | ORAL_CAPSULE | Freq: Two times a day (BID) | ORAL | 0 refills | Status: DC

## 2022-08-05 MED ORDER — AMOXICILLIN-POT CLAVULANATE 500-125 MG PO TABS: 1.0000 | ORAL_TABLET | Freq: Two times a day (BID) | ORAL | 0 refills | Status: DC

## 2022-08-05 MED ORDER — AMOXICILLIN-POT CLAVULANATE 500-125 MG PO TABS: 1.0000 | ORAL_TABLET | Freq: Two times a day (BID) | ORAL | Status: DC

## 2022-08-05 MED ORDER — SACCHAROMYCES BOULARDII 250 MG PO CAPS: 250.0000 mg | ORAL_CAPSULE | Freq: Two times a day (BID) | ORAL | Status: DC

## 2022-08-05 NOTE — Progress Notes (Signed)
Pt's son Jenny Reichmann) given AVS.  All questions answered.  All belongings given.  Pt stable at time of discharge.

## 2022-08-05 NOTE — Discharge Summary (Signed)
Discharge Summary  Jasmin Crane SJG:[id number] DOB: Oct 22, 1931  PCP: Hayden Rasmussen, MD  Admit date: 08/02/2022 Discharge date: 08/05/2022  Time spent: 35 minutes.  Recommendations for Outpatient Follow-up:  Follow-up with your primary care provider within a week. Follow-up with GI. Take your medications as prescribed. Continue PT OT with assistance and fall precautions.  Discharge Diagnoses:  Active Hospital Problems   Diagnosis Date Noted   Biliary obstruction 08/02/2022    Priority: 1.   Hypothyroidism 01/23/2022   Essential hypertension 03/06/2017   Hyperlipidemia 03/06/2017   Coronary artery disease due to lipid rich plaque 03/06/2017    Resolved Hospital Problems  No resolved problems to display.    Discharge Condition: Stable.  Diet recommendation: Resume previous diet.  Vitals:   08/05/22 0053 08/05/22 0509  BP: (!) 106/45 (!) 130/55  Pulse: 62 (!) 59  Resp: 16 18  Temp: 98.2 F (36.8 C) (!) 97.3 F (36.3 C)  SpO2: 92% 91%    History of present illness:  Jasmin Crane is a 88 y.o. female with PMH significant for HTN, HLD, CAD/stent, sick sinus syndrome, PVD, carotid artery stenosis, infrarenal AAA, hypothyroidism who is a resident at [facility] who presented to the ED for evaluation of pain rigors.  Recently underwent EUS followed by ERCP on 07/21/2022 by Dr. Benson Norway.  She was found to have a malignant appearing biliary stricture with dilated hepatic ducts with a mass causing obstruction.  Biliary sphincterotomy was performed and 1 plastic stent was placed into the common bile duct.  CBD brushing cytology showed atypical cells and no malignant cells were identified.  In addition to rigors, cold sweats, nausea without emesis earlier on the day of presentation, she had intermittent right flank and RUQ abdominal pain more noticeable at night and not correlated with meals.    CT abdomen/pelvis with contrast showed dilation of intrahepatic bile ducts, biliary  stent noted with possibility of malfunction of the stent.   Seen by GI, her biliary stent was replaced on 08/04/2022.  Hospital course complicated by E. coli and Klebsiella pneumonia bacteremia.  She was on Rocephin empirically for bacteremia.    On the day of discharge, 08/05/22, she was switched to oral antibiotics Augmentin x 10 days.   08/05/2022: The patient was seen and examined at her bedside.  There were no acute events overnight.  She is eager to go home.  She has no new complaints.  Hospital Course:  Principal Problem:   Biliary obstruction Active Problems:   Essential hypertension   Hyperlipidemia   Coronary artery disease due to lipid rich plaque   Hypothyroidism  Malfunction of biliary stent, replaced on 08/04/2022. s/p recent ERCP s/p sphincterotomy and biliary stent placement -11/24 Findings were concerning for malignant appearing biliary stricture and obstructing mass.   ERCP repeated on 08/04/2022, biliary stent replaced.  Follow-up with GI.   E. coli and Klebsiella species bacteremia, POA Received Rocephin empirically, switched to Augmentin on 08/05/22 x 10 days. Repeat blood cultures peripherally x 2 on 08/05/2022. Follow-up with your primary care provider.   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   Plan to follow-up with Dr. Benay Spice.   CAD/HLD Aortic atherosclerosis Infrarenal AAA Continue home regimen. Follow-up with your cardiologist.   Essential hypertension Continue Toprol-XL.   Hypothyroidism Continue Synthroid.       Procedures: None.  Consultations: None.  Discharge Exam: BP (!) 130/55 (BP Location: Left Arm)   Pulse [name])  59   Temp (!) 97.3 F (36.3 C) (Oral)   Resp 18   Ht '5\' 2"'$  (1.575 m)   Wt 43.4 kg   SpO2 91%   BMI 17.50 kg/m  General: 88 y.o. year-old female frail-appearing in no acute distress.  Alert and hard of  hearing. Cardiovascular: Regular rate and rhythm with no rubs or gallops.  No thyromegaly or JVD noted.   Respiratory: Clear to auscultation with no wheezes or rales. Good inspiratory effort. Abdomen: Soft nontender nondistended with normal bowel sounds x4 quadrants. Musculoskeletal: No lower extremity edema. 2/4 pulses in all 4 extremities. Skin: No ulcerative lesions noted or rashes, Psychiatry: Mood is appropriate for condition and setting  Discharge Instructions You were cared for by a hospitalist during your hospital stay. If you have any questions about your discharge medications or the care you received while you were in the hospital after you are discharged, you can call the unit and asked to speak with the hospitalist on call if the hospitalist that took care of you is not available. Once you are discharged, your primary care physician will handle any further medical issues. Please note that NO REFILLS for any discharge medications will be authorized once you are discharged, as it is imperative that you return to your primary care physician (or establish a relationship with a primary care physician if you do not have one) for your aftercare needs so that they can reassess your need for medications and monitor your lab values.   Allergies as of 08/05/2022       Reactions   Other Anaphylaxis, Swelling   Scallops   Shellfish Allergy Anaphylaxis, Swelling   Cozaar [losartan] Nausea Only   Pletaal [cilostazol] Palpitations        Medication List     STOP taking these medications    ibuprofen 200 MG tablet Commonly known as: ADVIL       TAKE these medications    amoxicillin-clavulanate 500-125 MG tablet Commonly known as: AUGMENTIN Take 1 tablet by mouth 2 (two) times daily for 10 days.   aspirin EC 81 MG tablet Take 81 mg by mouth daily.   cholecalciferol 25 MCG (1000 UNIT) tablet Commonly known as: VITAMIN D3 Take 1,000 Units by mouth daily.   citalopram 10 MG  tablet Commonly known as: CELEXA Take 10 mg by mouth daily.   donepezil 10 MG tablet Commonly known as: ARICEPT Take 10 mg by mouth in the morning.   ipratropium 0.06 % nasal spray Commonly known as: ATROVENT Place 2 sprays into both nostrils See admin instructions. Instill 2 sprays into each nostril 1-2 times a day   levothyroxine 88 MCG tablet Commonly known as: SYNTHROID Take 88 mcg by mouth at bedtime.   METAMUCIL FIBER PO Take by mouth See admin instructions. Mix 1 tablespoonful into the appropriate amount of water or beverage and drink once a day   metoprolol succinate 25 MG 24 hr tablet Commonly known as: TOPROL-XL TAKE 1 TABLET BY MOUTH EVERY DAY What changed: when to take this   potassium chloride 20 MEQ packet Commonly known as: KLOR-CON Take 20 mEq by mouth daily.   rosuvastatin 40 MG tablet Commonly known as: CRESTOR TAKE 1 TABLET BY MOUTH NIGHTLY AT BEDTIME What changed: See the new instructions.   saccharomyces boulardii 250 MG capsule Commonly known as: FLORASTOR Take 1 capsule (250 mg total) by mouth 2 (two) times daily.   VITAMIN B12 PO Take 1 tablet by mouth daily.  Allergies  Allergen Reactions   Other Anaphylaxis and Swelling    Scallops   Shellfish Allergy Anaphylaxis and Swelling   Cozaar [Losartan] Nausea Only   Pletaal [Cilostazol] Palpitations      The results of significant diagnostics from this hospitalization (including imaging, microbiology, ancillary and laboratory) are listed below for reference.    Significant Diagnostic Studies: DG ERCP  Result Date: 08/04/2022 CLINICAL DATA:  Biliary stricture EXAM: ERCP TECHNIQUE: Multiple spot images obtained with the fluoroscopic device and submitted for interpretation post-procedure. FLUOROSCOPY: Radiation Exposure Index (as provided by the fluoroscopic device): 19.3 mGy Kerma COMPARISON:  07/21/2022 FINDINGS: Nine intraoperative fluoroscopic images were provided for interpretation.  The submitted images demonstrate removal of existing biliary stent with opacification of intra and extrahepatic bile ducts. There is severe stenosis of the proximal common hepatic duct. Final images demonstrate new biliary stent in place. IMPRESSION: ERCP images demonstrate high-grade long segment stenosis of the proximal common hepatic duct. These images were submitted for radiologic interpretation only. Please see the procedural report for the amount of contrast and the fluoroscopy time utilized. Electronically Signed   By: Miachel Roux M.D.   On: 08/04/2022 09:51   US Abdomen Limited RUQ (LIVER/GB)  Result Date: 08/02/2022 CLINICAL DATA:  Biliary dilatation. EXAM: ULTRASOUND ABDOMEN LIMITED RIGHT UPPER QUADRANT COMPARISON:  CT abdomen pelvis dated 08/02/2022. FINDINGS: Evaluation is limited due to overlying bowel gas and body habitus. Gallbladder: The gallbladder is not visualized. Common bile duct: Diameter: A stent is noted within the common bile duct. The common bile duct is poorly delineated. There is moderate dilatation of the intrahepatic biliary trees better seen on the CT. Liver: The liver is grossly unremarkable. Portal vein is patent on color Doppler imaging with normal direction of blood flow towards the liver. Other: None. IMPRESSION: Moderate dilatation of the intrahepatic biliary trees. A stent is seen within the common bile duct. Electronically Signed   By: Anner Crete M.D.   On: 08/02/2022 22:51   CT Abdomen Pelvis W Contrast  Result Date: 08/02/2022 CLINICAL DATA:  Abdominal pain EXAM: CT ABDOMEN AND PELVIS WITH CONTRAST TECHNIQUE: Multidetector CT imaging of the abdomen and pelvis was performed using the standard protocol following bolus administration of intravenous contrast. RADIATION DOSE REDUCTION: This exam was performed according to the departmental dose-optimization program which includes automated exposure control, adjustment of the mA and/or kV according to patient size  and/or use of iterative reconstruction technique. CONTRAST:  177m OMNIPAQUE IOHEXOL 300 MG/ML  SOLN COMPARISON:  01/01/2018 FINDINGS: Lower chest: Coronary artery calcifications are seen. There is peribronchial thickening. There is slight increase in interstitial markings in the lower lung fields. There is no pleural effusion. Hepatobiliary: Gallbladder is not seen. There is moderate to marked dilation of intrahepatic bile ducts. There is biliary stent in the lumen of extrahepatic bile ducts entering the duodenum. Pancreas: There is mild prominence of pancreatic duct. There is 8 mm fluid density structure in head of the pancreas with no significant interval change. Spleen: Unremarkable. Adrenals/Urinary Tract: Adrenals are unremarkable. There is no hydronephrosis. There are no renal or ureteral stones. There is 1.7 cm cyst in the midportion of right kidney. Urinary bladder is distended. There is no wall thickening in the bladder. Stomach/Bowel: Small hiatal hernia is seen. Stomach is not distended. There is slight prominence of duodenum. There is no significant dilation of jejunum and ileum. Appendix is not seen. Multiple diverticula seen in colon. There is no significant wall thickening in colon. Vascular/Lymphatic: Extensive atherosclerotic plaques  and calcifications are seen in aorta and its major branches. There is ectasia of infrarenal abdominal aorta measuring up to 3.1 cm in diameter. Aorta measured 2.7 cm in maximum diameter in the previous study. There is 2.8 cm ectasia in distal abdominal aorta close to its bifurcation. Reproductive: Uterus is unremarkable. There is 2.3 cm fluid density structure in left adnexa which measured 2.7 cm in diameter in the previous study. Other: There is no ascites or pneumoperitoneum. Musculoskeletal: Degenerative changes are noted in lumbar spine with disc space narrowing, bony spurs and encroachment of neural foramina at multiple levels. There is 40-50% decrease in height  of body of T12 vertebra which is new. IMPRESSION: There is no evidence of intestinal obstruction or pneumoperitoneum. There is no hydronephrosis. There is moderate to marked dilation of intrahepatic bile ducts. Biliary stent is noted in x-ray body bile ducts. Possibility of malfunction of the stent should be considered. Increased interstitial markings in the lower lung fields may suggest scarring or subsegmental atelectasis. Coronary artery calcifications are seen. Aortic atherosclerosis.  There is 3.1 cm infrarenal aortic aneurysm. Recommend follow-up every 3 years.Reference: J Am Coll Radiol 4562;56:389-373. There is new 40-50% decrease in height of body of T12 vertebra which may be recent or old. Lumbar spondylosis. Small hiatal hernia. Diverticulosis of colon without signs of focal diverticulitis. Other findings as described in the body of the report. Electronically Signed   By: Elmer Picker M.D.   On: 08/02/2022 20:29   DG Chest Port 1 View  Result Date: 08/02/2022 CLINICAL DATA:  Fever. EXAM: PORTABLE CHEST 1 VIEW COMPARISON:  Jan 23, 2022. FINDINGS: The heart size and mediastinal contours are within normal limits. Both lungs are clear. Old right proximal humeral fracture. IMPRESSION: No active disease. Electronically Signed   By: Marijo Conception M.D.   On: 08/02/2022 17:44   DG ERCP  Result Date: 07/21/2022 CLINICAL DATA:  Stent placement. EXAM: ERCP COMPARISON:  CT AP, 01/01/2018. FLUOROSCOPY: Exposure Index (as provided by the fluoroscopic device): 25.7 mGy Kerma FINDINGS: Multiple, limited oblique planar images of the RIGHT upper quadrant obtained C-arm. Images demonstrating flexible endoscopy, biliary duct cannulation, retrograde cholangiogram and plastic biliary stent placement. Long segment stricture at the hilum of the liver, and involving the RIGHT hepatic ducts. IMPRESSION: Fluoroscopic imaging for ERCP and biliary stent placement. Long segment biliary stricture at the hilum of the  liver. For complete description of intra procedural findings, please see performing service dictation. Electronically Signed   By: Michaelle Birks M.D.   On: 07/21/2022 11:14    Microbiology: Recent Results (from the past 240 hour(s))  Resp Panel by RT-PCR (Flu A&B, Covid) Anterior Nasal Swab     Status: None   Collection Time: 08/02/22  5:13 PM   Specimen: Anterior Nasal Swab  Result Value Ref Range Status   SARS Coronavirus 2 by RT PCR NEGATIVE NEGATIVE Final    Comment: (NOTE) SARS-CoV-2 target nucleic acids are NOT DETECTED.  The SARS-CoV-2 RNA is generally detectable in upper respiratory specimens during the acute phase of infection. The lowest concentration of SARS-CoV-2 viral copies this assay can detect is 138 copies/mL. A negative result does not preclude SARS-Cov-2 infection and should not be used as the sole basis for treatment or other patient management decisions. A negative result may occur with  improper specimen collection/handling, submission of specimen other than nasopharyngeal swab, presence of viral mutation(s) within the areas targeted by this assay, and inadequate number of viral copies(<138 copies/mL). A negative result  must be combined with clinical observations, patient history, and epidemiological information. The expected result is Negative.  Fact Sheet for Patients:  EntrepreneurPulse.com.au  Fact Sheet for Healthcare Providers:  IncredibleEmployment.be  This test is no t yet approved or cleared by the [place] FDA and  has been authorized for detection and/or diagnosis of SARS-CoV-2 by FDA under an Emergency Use Authorization (EUA). This EUA will remain  in effect (meaning this test can be used) for the duration of the COVID-19 declaration under Section 564(b)(1) of the Act, 21 U.S.C.section 360bbb-3(b)(1), unless the authorization is terminated  or revoked sooner.       Influenza A by PCR NEGATIVE NEGATIVE  Final   Influenza B by PCR NEGATIVE NEGATIVE Final    Comment: (NOTE) The Xpert Xpress SARS-CoV-2/FLU/RSV plus assay is intended as an aid in the diagnosis of influenza from Nasopharyngeal swab specimens and should not be used as a sole basis for treatment. Nasal washings and aspirates are unacceptable for Xpert Xpress SARS-CoV-2/FLU/RSV testing.  Fact Sheet for Patients: EntrepreneurPulse.com.au  Fact Sheet for Healthcare Providers: IncredibleEmployment.be  This test is not yet approved or cleared by the [place] FDA and has been authorized for detection and/or diagnosis of SARS-CoV-2 by FDA under an Emergency Use Authorization (EUA). This EUA will remain in effect (meaning this test can be used) for the duration of the COVID-19 declaration under Section 564(b)(1) of the Act, 21 U.S.C. section 360bbb-3(b)(1), unless the authorization is terminated or revoked.  Performed at [facility], [place] [address] [place], [place] [address]   Culture, blood (routine x 2)     Status: Abnormal   Collection Time: 08/02/22  5:15 PM   Specimen: BLOOD RIGHT FOREARM  Result Value Ref Range Status   Specimen Description   Final    BLOOD RIGHT FOREARM Performed at [facility], [place] [address], [place], [place] [address]    Special Requests   Final    BOTTLES DRAWN AEROBIC AND ANAEROBIC Blood Culture adequate volume Performed at [place] [address] [place], [place] [address]    Culture  Setup Time   Final    GRAM NEGATIVE RODS IN BOTH AEROBIC AND ANAEROBIC BOTTLES CRITICAL RESULT CALLED TO, READ BACK BY AND VERIFIED WITH: PHARMD T GREEN [id number] AT 952 AM BY CM    Culture (A)  Final    ESCHERICHIA COLI KLEBSIELLA PNEUMONIAE CRITICAL RESULT CALLED TO, READ BACK BY AND VERIFIED WITH: M.JAMES AT 1149 ON 08/04/2022 BY T.SAAD. Performed at [facility], [place] [address], [place], [place] [address]     Report Status 08/05/2022 FINAL  Final   Organism ID, Bacteria ESCHERICHIA COLI  Final   Organism ID, Bacteria KLEBSIELLA PNEUMONIAE  Final      Susceptibility   Escherichia coli - MIC*    AMPICILLIN 8 SENSITIVE Sensitive     CEFAZOLIN <=4 SENSITIVE Sensitive     CEFEPIME <=0.12 SENSITIVE Sensitive     CEFTAZIDIME <=1 SENSITIVE Sensitive     CEFTRIAXONE <=0.25 SENSITIVE Sensitive     CIPROFLOXACIN <=0.25 SENSITIVE Sensitive     GENTAMICIN <=1 SENSITIVE Sensitive     IMIPENEM <=0.25 SENSITIVE Sensitive     TRIMETH/SULFA <=20 SENSITIVE Sensitive     AMPICILLIN/SULBACTAM 4 SENSITIVE Sensitive     PIP/TAZO <=4 SENSITIVE Sensitive     * ESCHERICHIA COLI   Klebsiella pneumoniae - MIC*    AMPICILLIN RESISTANT Resistant     CEFAZOLIN <=4 SENSITIVE Sensitive  CEFEPIME <=0.12 SENSITIVE Sensitive     CEFTAZIDIME <=1 SENSITIVE Sensitive     CEFTRIAXONE <=0.25 SENSITIVE Sensitive     CIPROFLOXACIN <=0.25 SENSITIVE Sensitive     GENTAMICIN <=1 SENSITIVE Sensitive     IMIPENEM <=0.25 SENSITIVE Sensitive     TRIMETH/SULFA <=20 SENSITIVE Sensitive     AMPICILLIN/SULBACTAM <=2 SENSITIVE Sensitive     * KLEBSIELLA PNEUMONIAE  Blood Culture ID Panel (Reflexed)     Status: Abnormal   Collection Time: 08/02/22  5:15 PM  Result Value Ref Range Status   Enterococcus faecalis NOT DETECTED NOT DETECTED Final   Enterococcus Faecium NOT DETECTED NOT DETECTED Final   Listeria monocytogenes NOT DETECTED NOT DETECTED Final   Staphylococcus species NOT DETECTED NOT DETECTED Final   Staphylococcus aureus (BCID) NOT DETECTED NOT DETECTED Final   Staphylococcus epidermidis NOT DETECTED NOT DETECTED Final   Staphylococcus lugdunensis NOT DETECTED NOT DETECTED Final   Streptococcus species NOT DETECTED NOT DETECTED Final   Streptococcus agalactiae NOT DETECTED NOT DETECTED Final   Streptococcus pneumoniae NOT DETECTED NOT DETECTED Final   Streptococcus pyogenes NOT DETECTED NOT DETECTED Final    A.calcoaceticus-baumannii NOT DETECTED NOT DETECTED Final   Bacteroides fragilis NOT DETECTED NOT DETECTED Final   Enterobacterales DETECTED (A) NOT DETECTED Final    Comment: Enterobacterales represent a large order of gram negative bacteria, not a single organism. CRITICAL RESULT CALLED TO, READ BACK BY AND VERIFIED WITH: PHARMD T GREEN [id number] AT 952 AM BY CM    Enterobacter cloacae complex NOT DETECTED NOT DETECTED Final   Escherichia coli DETECTED (A) NOT DETECTED Final    Comment: CRITICAL RESULT CALLED TO, READ BACK BY AND VERIFIED WITH: PHARMD T GREEN [id number] AT 952 AM BY CM    Klebsiella aerogenes NOT DETECTED NOT DETECTED Final   Klebsiella oxytoca NOT DETECTED NOT DETECTED Final   Klebsiella pneumoniae NOT DETECTED NOT DETECTED Final   Proteus species NOT DETECTED NOT DETECTED Final   Salmonella species NOT DETECTED NOT DETECTED Final   Serratia marcescens NOT DETECTED NOT DETECTED Final   Haemophilus influenzae NOT DETECTED NOT DETECTED Final   Neisseria meningitidis NOT DETECTED NOT DETECTED Final   Pseudomonas aeruginosa NOT DETECTED NOT DETECTED Final   Stenotrophomonas maltophilia NOT DETECTED NOT DETECTED Final   Candida albicans NOT DETECTED NOT DETECTED Final   Candida auris NOT DETECTED NOT DETECTED Final   Candida glabrata NOT DETECTED NOT DETECTED Final   Candida krusei NOT DETECTED NOT DETECTED Final   Candida parapsilosis NOT DETECTED NOT DETECTED Final   Candida tropicalis NOT DETECTED NOT DETECTED Final   Cryptococcus neoformans/gattii NOT DETECTED NOT DETECTED Final   CTX-M ESBL NOT DETECTED NOT DETECTED Final   Carbapenem resistance IMP NOT DETECTED NOT DETECTED Final   Carbapenem resistance KPC NOT DETECTED NOT DETECTED Final   Carbapenem resistance NDM NOT DETECTED NOT DETECTED Final   Carbapenem resist OXA 48 LIKE NOT DETECTED NOT DETECTED Final   Carbapenem resistance VIM NOT DETECTED NOT DETECTED Final    Comment: Performed at [facility],  [address] [place], [place] [address]  Culture, blood (routine x 2)     Status: None (Preliminary result)   Collection Time: 08/02/22  6:30 PM   Specimen: BLOOD  Result Value Ref Range Status   Specimen Description   Final    BLOOD BLOOD RIGHT HAND Performed at [place] [address], [place], [place] [address]    Special Requests   Final  BOTTLES DRAWN AEROBIC AND ANAEROBIC Blood Culture adequate volume Performed at [place] [address] [place], [place] [address]    Culture   Final    NO GROWTH 3 DAYS Performed at [facility], [place] [address], [place], [place] [address]    Report Status PENDING  Incomplete     Labs: Basic Metabolic Panel: Recent Labs  Lab 08/02/22 1715 08/03/22 0518 08/04/22 0439  NA 137 139 133*  K 4.0 3.9 3.6  CL 106 106 101  CO2 '25 25 25  '$ GLUCOSE 194* 94 86  BUN '20 16 14  '$ CREATININE 0.99 1.00 0.97  CALCIUM 8.9 8.8* 8.3*  MG 1.8  --   --    Liver Function Tests: Recent Labs  Lab 08/02/22 1715 08/03/22 0518 08/04/22 0439  AST 102* 56* 30  ALT 48* 33 22  ALKPHOS 384* 306* 243*  BILITOT 0.8 0.8 0.7  PROT 6.5 6.1* 5.3*  ALBUMIN 3.2* 2.4* 2.4*   Recent Labs  Lab 08/02/22 1715 08/04/22 0439  LIPASE 63* 48   No results for input(s): "AMMONIA" in the last 168 hours. CBC: Recent Labs  Lab 08/02/22 1715 08/03/22 0518 08/04/22 0439  WBC 10.8* 9.2 5.6  NEUTROABS 9.7*  --  3.9  HGB 10.9* 10.1* 9.8*  HCT 35.9* 33.4* 32.7*  MCV 88.4 87.9 89.1  PLT 132* 122* 101*   Cardiac Enzymes: No results for input(s): "CKTOTAL", "CKMB", "CKMBINDEX", "TROPONINI" in the last 168 hours. BNP: BNP (last 3 results) Recent Labs    01/23/22 1540  BNP 648.0*    ProBNP (last 3 results) No results for input(s): "PROBNP" in the last 8760 hours.  CBG: No results for input(s): "GLUCAP" in the last 168 hours.     Signed:  Kayleen Memos, MD [facility] 08/05/2022, 11:59 AM

## 2022-08-05 NOTE — TOC Progression Note (Addendum)
Transition of Care [facility] [address] [address]) - Progression Note    Patient Details  Name: Jasmin Crane MRN: [id number] Date of Birth: 1932-07-18  Transition of Care [facility]) CM/SW Contact  Henrietta Dine, RN Phone Number: 08/05/2022, 2:16 PM  Clinical Narrative:    D/C orders received for pt; contacted pt's dtr Nancy Nordmann [phone]) to discuss HHPT; the pt is from [place] is the contracted agency; she says she has the pt has results pending from labs and she would like to discuss this with her MD prior to d/c; she will also let this CM know her decision regarding [place] agency.  -1440- Spoke w/ pt's dtr in room; she says she has spoke with the MD; Ms Valarie Merino says she will take the pt to her house this weekend and to [facility] on Monday since no services will be available this weekend; she also says the pt needs a prescription for HHPT to present to [facility]; they opted to use the agency that is at the facility; called Alfredo Bach and LVM for [employer], Mgr on Duty to notify her of pt status; awaiting return call.  -1453- Return call from [place]; she was given update on pt status and verified RX needed for HHPT; Dr Nevada Crane notified.     Expected Discharge Plan: Home/Self Care Barriers to Discharge: Continued Medical Work up  Expected Discharge Plan and Services Expected Discharge Plan: Home/Self Care In-house Referral: Clinical Social Work   Post Acute Care Choice: NA Living arrangements for the past 2 months: [place] Expected Discharge Date: 08/05/22               DME Arranged: [name] DME Agency: NA                   Social Determinants of Health (SDOH) Interventions    Readmission Risk Interventions     No data to display

## 2022-08-05 NOTE — Progress Notes (Signed)
Mobility Specialist - Progress Note   08/05/22 1547  Mobility  Activity Ambulated with assistance in hallway;Ambulated with assistance to bathroom  Level of Assistance Standby assist, set-up cues, supervision of patient - no hands on  Assistive Device Front wheel walker  Distance Ambulated (ft) 500 ft  Activity Response Tolerated well  Mobility Referral Yes  $Mobility charge 1 Mobility   Pt received in bed and agreeable to mobility. No complaints during mobility. Assisted pt to bathroom afterwards.  Pt to bathroom after session with all needs met & daughter assisting.  [facility]

## 2022-08-05 NOTE — TOC Transition Note (Signed)
Transition of Care [facility]) - CM/SW Discharge Note   Patient Details  Name: Jasmin Crane MRN: [id number] Date of Birth: [birth year]  Transition of Care [facility]) CM/SW Contact:  Henrietta Dine, RN Phone Number: 08/05/2022, 3:54 PM   Clinical Narrative:     Pt will d/c home w/ dtr Nancy Nordmann; RX for HHPT per Dr Nevada Crane; no TOC needs.    Barriers to Discharge: Continued Medical Work up   Patient Goals and CMS Choice Patient states their goals for this hospitalization and ongoing recovery are:: Return to independent living at [place] offered to / list presented to : NA  Discharge Placement                       Discharge Plan and Services In-house Referral: Clinical Social Work   Post Acute Care Choice: NA          DME Arranged: [name] DME Agency: NA                  Social Determinants of Health (SDOH) Interventions     Readmission Risk Interventions     No data to display

## 2022-08-05 NOTE — Progress Notes (Signed)
PHARMACY NOTE:  ANTIMICROBIAL RENAL DOSAGE ADJUSTMENT  Current antimicrobial regimen includes a mismatch between antimicrobial dosage and estimated renal function.  As per policy approved by the Pharmacy & Therapeutics and Medical Executive Committees, the antimicrobial dosage will be adjusted accordingly.  Current antimicrobial dosage:  augmentin  XR 875 mg q12  Indication: bacteremia  Renal Function:  Estimated Creatinine Clearance: 27.5 mL/min (by C-G formula based on SCr of 0.97 mg/dL). '[]'$      On intermittent HD, scheduled: '[]'$      On CRRT    Antimicrobial dosage has been changed to:  augmentin 500 mg q12h    Thank you for allowing pharmacy to be a part of this patient's care.  Lynelle Doctor, [employer] 08/05/2022 11:15 AM

## 2022-08-06 LAB — BLOOD CULTURE ID PANEL (REFLEXED) - BCID2

## 2022-08-06 NOTE — Anesthesia Postprocedure Evaluation (Signed)
Anesthesia Post Note  Patient: Jasmin Crane  Procedure(s) Performed: ENDOSCOPIC RETROGRADE CHOLANGIOPANCREATOGRAPHY (ERCP) BILIARY BRUSHING [place]     Patient location during evaluation: Endoscopy Anesthesia Type: General Level of consciousness: awake and alert Pain management: pain level controlled Vital Signs Assessment: post-procedure vital signs reviewed and stable Respiratory status: spontaneous breathing, nonlabored ventilation, respiratory function stable and patient connected to nasal cannula oxygen Cardiovascular status: blood pressure returned to baseline and stable Postop Assessment: no apparent nausea or vomiting Anesthetic complications: no  No notable events documented.  Last Vitals:  Vitals:   08/05/22 0053 08/05/22 0509  BP: (!) 106/45 (!) 130/55  Pulse: 62 (!) 59  Resp: 16 18  Temp: 36.8 C (!) 36.3 C  SpO2: 92% 91%    Last Pain:  Vitals:   08/05/22 1000  TempSrc:   PainSc: 6                  Shabria Egley L Vonetta Foulk

## 2022-08-07 ENCOUNTER — Encounter (HOSPITAL_COMMUNITY): Payer: Self-pay | Admitting: Gastroenterology

## 2022-08-07 LAB — CULTURE, BLOOD (ROUTINE X 2)
Culture: NO GROWTH
Special Requests: ADEQUATE

## 2022-08-08 ENCOUNTER — Inpatient Hospital Stay: Payer: Medicare Other | Attending: Oncology | Admitting: Oncology

## 2022-08-08 ENCOUNTER — Telehealth: Payer: Self-pay | Admitting: *Deleted

## 2022-08-08 VITALS — BP 142/65 | HR 57 | Temp 97.9°F | Resp 18 | Ht 62.0 in | Wt 99.8 lb

## 2022-08-08 DIAGNOSIS — C240 Malignant neoplasm of extrahepatic bile duct: Secondary | ICD-10-CM | POA: Diagnosis not present

## 2022-08-08 LAB — CULTURE, BLOOD (ROUTINE X 2)

## 2022-08-08 LAB — CYTOLOGY - NON PAP

## 2022-08-08 NOTE — Telephone Encounter (Signed)
Patient is late for her new patient visit. Called daughter/patient to inquire if they are en route or not coming. Left VM requesting return call.

## 2022-08-08 NOTE — Progress Notes (Signed)
[facility] New Patient Consult   Requesting MD: Hayden Rasmussen, Md [address] [place],  [place] [address]   Nancie Bocanegra 88 y.o.  25-Jan-1932    Reason for Consult: Cholangiocarcinoma   HPI: Ms. Sundby is here today with her daughter.  She reports seeing Dr. Darron Doom with fatigue and somnolence.  The liver enzymes were elevated.  An abdominal ultrasound on 06/30/2022 revealed moderate severe intrahepatic biliary ductal dilatation. A CT abdomen/pelvis on 07/06/2022 revealed a 5 mm left lower lobe nodule.  Severe intrahepatic biliary ductal dilation with focal caliber change secondary to abnormal enhancement at the confluence of the Intermatic ducts.  The abnormal enhancement extends over 2.6 cm and involve the common bile duct.  There is surrounding ill-defined soft tissue.  The gallbladder is not well-visualized.  The distal common duct measured 7 mm, normal for age.  1.7 cm cystic lesion in the pancreas head.  Mild T12 compression fracture.  She was referred to Dr. Benson Norway and Was Taken to an EUS and ERCP 07/21/2022.  A Cyst Was Identified in the Pancreas Head.  Marked Intrahepatic Biliary Duct Dilation.  No Clear Mass on the EUS.  Distinct Narrowing Noted in the Proximal Common Bile Duct.  The ERCP Revealed a Single Segmental Stenos, 30 Mm in the Upper Third of the Main Bile Duct and Common Hepatic Duct.  Diffuse Dilation of the Right and Left Intrahepatic Ducts.  A Plastic Stent Was Placed in the Common Bile Duct.  2 Brushings of the Malignant Stricture in the Pathology Revealed "Atypical Cells ".  Ms. Asante Was Seen in the Emergency Room on 08/02/2022 with Rigors.  The Liver Enzymes Were Elevated and a Blood Culture Returned Positive for E. coli and Klebsiella.  He Was Admitted and Placed on IV Antibiotics.  She Was Transition to Augmentin at Discharge on 08/05/2022. Dr. Benson Norway Took Her to a Repeat ERCP on 08/04/2022.  A Single Stricture Was Found in the Common Bile Duct  and Common Hepatic Duct.  The Stricture Was Malignant Appearing.  Brushings Were Obtained.  The Original Stent Was Removed.  A Plastic Stent Was Placed in the Common Hepatic Duct.  The cytology from the biliary stricture brushing revealed malignant cells consistent with adenocarcinoma.     Past Medical History:  Diagnosis Date   Arrhythmia    Cancer [facility])    skin cancer 2005   Carotid artery occlusion    Claudication of lower extremity (HCC)    Complication of anesthesia    one bad experience 30-40  yrs ago, since had procedures went okay   Coronary artery disease    Diverticulitis    Hyperlipidemia    Hypertension    Hypothyroidism    Myopathy    Paralysis of tongue    Sick sinus syndrome ([place])     .  G3 P3   .  Dementia-Alzheimer's, diagnosed on evaluation at [facility] November 2022  Past Surgical History:  Procedure Laterality Date   APPENDECTOMY     BILIARY BRUSHING  07/21/2022   Procedure: BILIARY BRUSHING;  Surgeon: Carol Ada, MD;  Location: [facility];  Service: Gastroenterology;;   BILIARY BRUSHING  08/04/2022   Procedure: BILIARY BRUSHING;  Surgeon: Carol Ada, MD;  Location: [facility];  Service: Gastroenterology;;   BILIARY STENT PLACEMENT [name] 07/21/2022   Procedure: BILIARY STENT PLACEMENT;  Surgeon: Carol Ada, MD;  Location: [facility];  Service: Gastroenterology;  Laterality: [name];   BILIARY STENT PLACEMENT [name] 08/04/2022  Procedure: BILIARY STENT PLACEMENT;  Surgeon: Carol Ada, MD;  Location: [facility];  Service: Gastroenterology;  Laterality: [name];   CAROTID ENDARTERECTOMY     CATARACT EXTRACTION, BILATERAL     CESAREAN SECTION     COLONOSCOPY     CORONARY ANGIOPLASTY     CORONARY STENT PLACEMENT     right coronary artery 1998   ENDOSCOPIC RETROGRADE CHOLANGIOPANCREATOGRAPHY (ERCP) WITH PROPOFOL [name] 07/21/2022   Procedure: ENDOSCOPIC RETROGRADE CHOLANGIOPANCREATOGRAPHY (ERCP) WITH PROPOFOL;  Surgeon: Carol Ada, MD;  Location: [facility];  Service: Gastroenterology;  Laterality: [name];   ERCP [name] 08/04/2022   Procedure: ENDOSCOPIC RETROGRADE CHOLANGIOPANCREATOGRAPHY (ERCP);  Surgeon: Carol Ada, MD;  Location: [facility];  Service: Gastroenterology;  Laterality: [name];   ESOPHAGOGASTRODUODENOSCOPY (EGD) WITH PROPOFOL [name] 07/21/2022   Procedure: ESOPHAGOGASTRODUODENOSCOPY (EGD) WITH PROPOFOL;  Surgeon: Carol Ada, MD;  Location: [facility];  Service: Gastroenterology;  Laterality: [name];   EUS [name] 07/21/2022   Procedure: UPPER ENDOSCOPIC ULTRASOUND (EUS) LINEAR;  Surgeon: Carol Ada, MD;  Location: [facility];  Service: Gastroenterology;  Laterality: [name];   INGUINAL HERNIA REPAIR Right 02/25/2019   Procedure: RIGHT INGUINAL HERNIA REPAIR WITH MESH;  Surgeon: Donnie Mesa, MD;  Location: [facility];  Service: General;  Laterality: Right;  LMA AND TAP BLOCK   MULTIPLE TOOTH EXTRACTIONS     SALIVARY GLAND SURGERY     removed 1998   SALPINGECTOMY     SKIN CANCER EXCISION     2005   SPHINCTEROTOMY  07/21/2022   Procedure: SPHINCTEROTOMY;  Surgeon: Carol Ada, MD;  Location: [facility];  Service: Gastroenterology;;   Lavell Islam REMOVAL  08/04/2022   Procedure: STENT REMOVAL;  Surgeon: Carol Ada, MD;  Location: [facility];  Service: Gastroenterology;;    Medications: Reviewed  Allergies:  Allergies  Allergen Reactions   Other Anaphylaxis and Swelling    Scallops   Shellfish Allergy Anaphylaxis and Swelling   Cozaar [Losartan] Nausea Only   Pletaal [Cilostazol] Palpitations    Family history: Her mother had "cancer ".  Social History:   She lives with her daughter in [place].  She relocated to [place] from [place].  She worked in a government office occupation.  She quit smoking cigarettes 40 years ago.  No alcohol use.  She has received a transfusion in the past.  No risk factor for hepatitis.  ROS:   Positives include: Chills on hospital admission 08/02/2022, rare urinary incontinence,  hemorrhoid bleeding while in the hospital December 2023  A complete ROS was otherwise negative.  Physical Exam:  Blood pressure (!) 142/65, pulse (!) 57, temperature 97.9 F (36.6 C), temperature source Oral, resp. rate 18, height '5\' 2"'$  (1.575 m), weight 99 lb 12.8 oz (45.3 kg), SpO2 98 %.  HEENT: Oral cavity without visible mass, neck without mass Lungs: End inspiratory rhonchi at the posterior base bilaterally, no respiratory distress Cardiac: Regular rate and rhythm Abdomen: No hepatosplenomegaly, no mass, no apparent ascites, nontender  Vascular: No leg edema Lymph nodes: No cervical, supraclavicular, axillary, or inguinal nodes Neurologic: Alert, oriented to year, not day.  The motor exam appears intact in the upper and lower extremities bilaterally Skin: Red/purplish discoloration at the right upper back Musculoskeletal: Spine tenderness   LAB:  CBC  Lab Results  Component Value Date   WBC 5.6 08/04/2022   HGB 9.8 (L) 08/04/2022   HCT 32.7 (L) 08/04/2022   MCV 89.1 08/04/2022   PLT 101 (L) 08/04/2022   NEUTROABS 3.9 08/04/2022  CMP  Lab Results  Component Value Date   NA 133 (L) 08/04/2022   K 3.6 08/04/2022   CL 101 08/04/2022   CO2 25 08/04/2022   GLUCOSE 86 08/04/2022   BUN 14 08/04/2022   CREATININE 0.97 08/04/2022   CALCIUM 8.3 (L) 08/04/2022   PROT 5.3 (L) 08/04/2022   ALBUMIN 2.4 (L) 08/04/2022   AST 30 08/04/2022   ALT 22 08/04/2022   ALKPHOS 243 (H) 08/04/2022   BILITOT 0.7 08/04/2022   GFRNONAA 56 (L) 08/04/2022   GFRAA >60 04/17/2020     Lab Results  Component Value Date   BEE100 137 (H) 08/04/2022    Imaging: As per HPI, CT images 08/02/2022 reviewed    Assessment/Plan:   Cholangiocarcinoma, common hepatic and common bile duct Abdominal ultrasound 06/30/2022-moderate to severe intrahepatic biliary ductal dilatation CT abdomen/pelvis 07/06/2022-hepatic biliary ductal dilation secondary to normal enhancement involving the  common hepatic duct and common bile duct, with surrounding ill-defined soft tissue, cystic lesion in the pancreas head, mild aneurysmal enlargement of the abdominal aorta left adnexal cyst EUS/ERCP 07/21/2022-pancreas head cyst, intrahepatic bile duct dilation, single 30 mm stricture involving the common hepatic duct and common bile duct plastic stent placed in common duct, cytology-"atypical "cells CT abdomen/pelvis 02/14/2022-moderate to marked dilation of Intermatic ducts, biliary stent in place, fluid density structure in the left adnexa-2.3 cm, ERCP 08/04/2022-malignant appearing stricture in the common hepatic and common bile duct, previous stent removed, new plastic stent placed, brushing positive for adenocarcinoma E. coli and Klebsiella pneumonia bacteremia 08/02/2022 Elevated liver enzymes secondary to #1 Dementia CAD Hypertension Hyperlipidemia Hypothyroidism Left lower lung nodule, 5 mm, on CT from [facility] 07/06/2022  Disposition:   Ms. Muise has been diagnosed with adenocarcinoma involving brushing of a common hepatic/common bile duct stricture.  She presented with biliary obstruction and bacteremia. The clinical presentation is consistent with a diagnosis and extrahepatic cholangiocarcinoma, "Kl[place]kin's "tumor.  I discussed the diagnosis, prognosis, and treatment options with Ms. Rouillard and her daughter.  They understand the only potentially curative therapy is surgery.  Ms. Suto is likely not a surgical candidate secondary to advanced age and comorbid conditions.  She and her daughter are in agreement that surgery should not be considered.  Palliative treatment options include systemic chemotherapy and radiation.  There are potential toxicities associated with these treatments.  She appears asymptomatic or minimally symptomatic from the cancer at present.  The tumor has been palliated with placement of a bile duct stent.  I recommend hospice care.  She agrees to a referral for home  hospice care.  We discussed CODE STATUS.  She will be placed on a no CODE BLUE status.  Ms. Verhagen will return for an office visit in 1 month.  I am available to see her sooner as needed.  Betsy Coder, MD  08/08/2022, 4:25 PM

## 2022-08-09 ENCOUNTER — Ambulatory Visit: Payer: Medicare Other | Admitting: Oncology

## 2022-08-09 ENCOUNTER — Other Ambulatory Visit: Payer: Self-pay | Admitting: *Deleted

## 2022-08-09 DIAGNOSIS — C240 Malignant neoplasm of extrahepatic bile duct: Secondary | ICD-10-CM

## 2022-08-09 NOTE — Progress Notes (Signed)
Per Dr. Benay Spice, placed order for [facility] for home. She has asked to be a DNR as well.  Confirmed that Hospice received the referral and that Dr. Benay Spice will be the attending and Hospice MD can assist w/symptom management.

## 2022-08-10 LAB — CULTURE, BLOOD (ROUTINE X 2): Culture: NO GROWTH

## 2022-08-13 ENCOUNTER — Emergency Department (HOSPITAL_COMMUNITY): Payer: Medicare Other

## 2022-08-13 ENCOUNTER — Encounter (HOSPITAL_COMMUNITY): Payer: Self-pay

## 2022-08-13 ENCOUNTER — Inpatient Hospital Stay (HOSPITAL_COMMUNITY)
Admission: EM | Admit: 2022-08-13 | Discharge: 2022-08-17 | DRG: 871 | Disposition: A | Discharge status: Hospice | Payer: Medicare Other | Source: Skilled Nursing Facility | Attending: Internal Medicine | Admitting: Internal Medicine

## 2022-08-13 DIAGNOSIS — Z7982 Long term (current) use of aspirin: Secondary | ICD-10-CM

## 2022-08-13 DIAGNOSIS — I495 Sick sinus syndrome: Secondary | ICD-10-CM | POA: Diagnosis present

## 2022-08-13 DIAGNOSIS — Z681 Body mass index (BMI) 19 or less, adult: Secondary | ICD-10-CM | POA: Diagnosis not present

## 2022-08-13 DIAGNOSIS — Z20822 Contact with and (suspected) exposure to covid-19: Secondary | ICD-10-CM | POA: Diagnosis present

## 2022-08-13 DIAGNOSIS — R6520 Severe sepsis without septic shock: Secondary | ICD-10-CM | POA: Diagnosis present

## 2022-08-13 DIAGNOSIS — I5032 Chronic diastolic (congestive) heart failure: Secondary | ICD-10-CM | POA: Diagnosis present

## 2022-08-13 DIAGNOSIS — I110 Hypertensive heart disease with heart failure: Secondary | ICD-10-CM | POA: Diagnosis present

## 2022-08-13 DIAGNOSIS — M4804 Spinal stenosis, thoracic region: Secondary | ICD-10-CM | POA: Diagnosis present

## 2022-08-13 DIAGNOSIS — Z9049 Acquired absence of other specified parts of digestive tract: Secondary | ICD-10-CM

## 2022-08-13 DIAGNOSIS — I714 Abdominal aortic aneurysm, without rupture, unspecified: Secondary | ICD-10-CM | POA: Diagnosis present

## 2022-08-13 DIAGNOSIS — A4152 Sepsis due to Pseudomonas: Secondary | ICD-10-CM | POA: Diagnosis present

## 2022-08-13 DIAGNOSIS — R7989 Other specified abnormal findings of blood chemistry: Secondary | ICD-10-CM | POA: Diagnosis present

## 2022-08-13 DIAGNOSIS — C221 Intrahepatic bile duct carcinoma: Secondary | ICD-10-CM | POA: Diagnosis present

## 2022-08-13 DIAGNOSIS — Z7189 Other specified counseling: Secondary | ICD-10-CM | POA: Diagnosis not present

## 2022-08-13 DIAGNOSIS — I7143 Infrarenal abdominal aortic aneurysm, without rupture: Secondary | ICD-10-CM | POA: Diagnosis present

## 2022-08-13 DIAGNOSIS — Z515 Encounter for palliative care: Secondary | ICD-10-CM | POA: Diagnosis not present

## 2022-08-13 DIAGNOSIS — A419 Sepsis, unspecified organism: Secondary | ICD-10-CM | POA: Diagnosis not present

## 2022-08-13 DIAGNOSIS — Z888 Allergy status to other drugs, medicaments and biological substances status: Secondary | ICD-10-CM

## 2022-08-13 DIAGNOSIS — E441 Mild protein-calorie malnutrition: Secondary | ICD-10-CM | POA: Diagnosis present

## 2022-08-13 DIAGNOSIS — I2510 Atherosclerotic heart disease of native coronary artery without angina pectoris: Secondary | ICD-10-CM | POA: Diagnosis present

## 2022-08-13 DIAGNOSIS — C229 Malignant neoplasm of liver, not specified as primary or secondary: Secondary | ICD-10-CM

## 2022-08-13 DIAGNOSIS — Z79899 Other long term (current) drug therapy: Secondary | ICD-10-CM

## 2022-08-13 DIAGNOSIS — J439 Emphysema, unspecified: Secondary | ICD-10-CM | POA: Diagnosis present

## 2022-08-13 DIAGNOSIS — Z66 Do not resuscitate: Secondary | ICD-10-CM | POA: Diagnosis present

## 2022-08-13 DIAGNOSIS — D649 Anemia, unspecified: Secondary | ICD-10-CM | POA: Diagnosis present

## 2022-08-13 DIAGNOSIS — J9601 Acute respiratory failure with hypoxia: Secondary | ICD-10-CM | POA: Diagnosis present

## 2022-08-13 DIAGNOSIS — R69 Illness, unspecified: Secondary | ICD-10-CM

## 2022-08-13 DIAGNOSIS — E039 Hypothyroidism, unspecified: Secondary | ICD-10-CM | POA: Diagnosis present

## 2022-08-13 DIAGNOSIS — J189 Pneumonia, unspecified organism: Secondary | ICD-10-CM | POA: Diagnosis present

## 2022-08-13 DIAGNOSIS — E785 Hyperlipidemia, unspecified: Secondary | ICD-10-CM | POA: Diagnosis present

## 2022-08-13 DIAGNOSIS — G309 Alzheimer's disease, unspecified: Secondary | ICD-10-CM | POA: Diagnosis present

## 2022-08-13 DIAGNOSIS — Z91013 Allergy to seafood: Secondary | ICD-10-CM

## 2022-08-13 DIAGNOSIS — M47816 Spondylosis without myelopathy or radiculopathy, lumbar region: Secondary | ICD-10-CM | POA: Diagnosis present

## 2022-08-13 DIAGNOSIS — B370 Candidal stomatitis: Secondary | ICD-10-CM | POA: Diagnosis present

## 2022-08-13 DIAGNOSIS — Z8505 Personal history of malignant neoplasm of liver: Secondary | ICD-10-CM

## 2022-08-13 DIAGNOSIS — I2583 Coronary atherosclerosis due to lipid rich plaque: Secondary | ICD-10-CM | POA: Diagnosis present

## 2022-08-13 DIAGNOSIS — M8580 Other specified disorders of bone density and structure, unspecified site: Secondary | ICD-10-CM | POA: Diagnosis present

## 2022-08-13 DIAGNOSIS — F0280 Dementia in other diseases classified elsewhere without behavioral disturbance: Secondary | ICD-10-CM | POA: Diagnosis present

## 2022-08-13 DIAGNOSIS — Z79890 Hormone replacement therapy (postmenopausal): Secondary | ICD-10-CM

## 2022-08-13 DIAGNOSIS — Z85828 Personal history of other malignant neoplasm of skin: Secondary | ICD-10-CM

## 2022-08-13 DIAGNOSIS — I2489 Other forms of acute ischemic heart disease: Secondary | ICD-10-CM | POA: Diagnosis present

## 2022-08-13 DIAGNOSIS — I739 Peripheral vascular disease, unspecified: Secondary | ICD-10-CM | POA: Diagnosis present

## 2022-08-13 DIAGNOSIS — Z8249 Family history of ischemic heart disease and other diseases of the circulatory system: Secondary | ICD-10-CM

## 2022-08-13 DIAGNOSIS — I10 Essential (primary) hypertension: Secondary | ICD-10-CM | POA: Diagnosis present

## 2022-08-13 DIAGNOSIS — C240 Malignant neoplasm of extrahepatic bile duct: Secondary | ICD-10-CM | POA: Diagnosis present

## 2022-08-13 DIAGNOSIS — Z87891 Personal history of nicotine dependence: Secondary | ICD-10-CM

## 2022-08-13 LAB — CBC WITH DIFFERENTIAL/PLATELET
Abs Immature Granulocytes: 0.11 10*3/uL — ABNORMAL HIGH (ref 0.00–0.07)
Basophils Absolute: 0.1 10*3/uL (ref 0.0–0.1)
Basophils Relative: 0 %
Eosinophils Absolute: 0 10*3/uL (ref 0.0–0.5)
Eosinophils Relative: 0 %
HCT: 34.3 % — ABNORMAL LOW (ref 36.0–46.0)
Hemoglobin: 10.4 g/dL — ABNORMAL LOW (ref 12.0–15.0)
Immature Granulocytes: 1 %
Lymphocytes Relative: 2 %
Lymphs Abs: 0.3 10*3/uL — ABNORMAL LOW (ref 0.7–4.0)
MCH: 26.7 pg (ref 26.0–34.0)
MCHC: 30.3 g/dL (ref 30.0–36.0)
MCV: 88.2 fL (ref 80.0–100.0)
Monocytes Absolute: 0.7 10*3/uL (ref 0.1–1.0)
Monocytes Relative: 4 %
Neutro Abs: 16.6 10*3/uL — ABNORMAL HIGH (ref 1.7–7.7)
Neutrophils Relative %: 93 %
Platelets: 157 10*3/uL (ref 150–400)
RBC: 3.89 MIL/uL (ref 3.87–5.11)
RDW: 18.6 % — ABNORMAL HIGH (ref 11.5–15.5)
WBC: 17.8 10*3/uL — ABNORMAL HIGH (ref 4.0–10.5)
nRBC: 0 % (ref 0.0–0.2)

## 2022-08-13 LAB — COMPREHENSIVE METABOLIC PANEL
ALT: 77 U/L — ABNORMAL HIGH (ref 0–44)
AST: 138 U/L — ABNORMAL HIGH (ref 15–41)
Albumin: 3 g/dL — ABNORMAL LOW (ref 3.5–5.0)
Alkaline Phosphatase: 486 U/L — ABNORMAL HIGH (ref 38–126)
Anion gap: 11 (ref 5–15)
BUN: 24 mg/dL — ABNORMAL HIGH (ref 8–23)
CO2: 21 mmol/L — ABNORMAL LOW (ref 22–32)
Calcium: 9.2 mg/dL (ref 8.9–10.3)
Chloride: 104 mmol/L (ref 98–111)
Creatinine, Ser: 1.04 mg/dL — ABNORMAL HIGH (ref 0.44–1.00)
GFR, Estimated: 52 mL/min — ABNORMAL LOW (ref >60)
Glucose, Bld: 143 mg/dL — ABNORMAL HIGH (ref 70–99)
Potassium: 4 mmol/L (ref 3.5–5.1)
Sodium: 136 mmol/L (ref 135–145)
Total Bilirubin: 1.2 mg/dL (ref 0.3–1.2)
Total Protein: 6.8 g/dL (ref 6.5–8.1)

## 2022-08-13 LAB — RESP PANEL BY RT-PCR (RSV, FLU A&B, COVID)  RVPGX2
Influenza A by PCR: NEGATIVE
Influenza B by PCR: NEGATIVE
Resp Syncytial Virus by PCR: NEGATIVE
SARS Coronavirus 2 by RT PCR: NEGATIVE

## 2022-08-13 LAB — TROPONIN I (HIGH SENSITIVITY)
Troponin I (High Sensitivity): 116 ng/L (ref <18)
Troponin I (High Sensitivity): 143 ng/L (ref <18)
Troponin I (High Sensitivity): 207 ng/L (ref <18)

## 2022-08-13 LAB — LACTIC ACID, PLASMA
Lactic Acid, Venous: 3.7 mmol/L (ref 0.5–1.9)
Lactic Acid, Venous: 3.3 mmol/L (ref 0.5–1.9)
Lactic Acid, Venous: 2.2 mmol/L (ref 0.5–1.9)

## 2022-08-13 LAB — LIPASE, BLOOD: Lipase: 35 U/L (ref 11–51)

## 2022-08-13 MED ORDER — METRONIDAZOLE 500 MG/100ML IV SOLN
500.0000 mg | Freq: Two times a day (BID) | INTRAVENOUS | Status: DC
  Administered 2022-08-13: 500 mg via INTRAVENOUS
  Filled 2022-08-13: qty 100

## 2022-08-13 MED ORDER — LEVOTHYROXINE SODIUM 88 MCG PO TABS
88.0000 ug | ORAL_TABLET | Freq: Every day | ORAL | Status: DC
  Administered 2022-08-14 – 2022-08-15 (×2): 88 ug via ORAL
  Filled 2022-08-13 (×3): qty 1

## 2022-08-13 MED ORDER — METRONIDAZOLE 500 MG/100ML IV SOLN
500.0000 mg | Freq: Once | INTRAVENOUS | Status: AC
  Administered 2022-08-13: 500 mg via INTRAVENOUS
  Filled 2022-08-13: qty 100

## 2022-08-13 MED ORDER — SODIUM CHLORIDE 0.9 % IV SOLN
2.0000 g | Freq: Once | INTRAVENOUS | Status: AC
  Administered 2022-08-13: 2 g via INTRAVENOUS
  Filled 2022-08-13: qty 12.5

## 2022-08-13 MED ORDER — ACETAMINOPHEN 10 MG/ML IV SOLN
650.0000 mg | Freq: Once | INTRAVENOUS | Status: AC
  Administered 2022-08-13: 1000 mg via INTRAVENOUS
  Filled 2022-08-13: qty 65

## 2022-08-13 MED ORDER — ONDANSETRON HCL 4 MG PO TABS: 4.0000 mg | ORAL_TABLET | Freq: Four times a day (QID) | ORAL | Status: DC | PRN

## 2022-08-13 MED ORDER — ACETAMINOPHEN 325 MG PO TABS
650.0000 mg | ORAL_TABLET | Freq: Four times a day (QID) | ORAL | Status: DC | PRN
  Administered 2022-08-13: 650 mg via ORAL
  Filled 2022-08-13 (×2): qty 2

## 2022-08-13 MED ORDER — DONEPEZIL HCL 10 MG PO TABS
10.0000 mg | ORAL_TABLET | Freq: Every day | ORAL | Status: DC
  Administered 2022-08-14 – 2022-08-16 (×3): 10 mg via ORAL
  Filled 2022-08-13: qty 2
  Filled 2022-08-13 (×3): qty 1

## 2022-08-13 MED ORDER — HYDROMORPHONE HCL 1 MG/ML IJ SOLN
0.5000 mg | INTRAMUSCULAR | Status: DC | PRN
  Administered 2022-08-13 – 2022-08-15 (×2): 0.5 mg via INTRAVENOUS
  Filled 2022-08-13 (×2): qty 1

## 2022-08-13 MED ORDER — ASPIRIN 81 MG PO TBEC
81.0000 mg | DELAYED_RELEASE_TABLET | Freq: Every day | ORAL | Status: DC
  Administered 2022-08-14 – 2022-08-16 (×3): 81 mg via ORAL
  Filled 2022-08-13 (×4): qty 1

## 2022-08-13 MED ORDER — ACETAMINOPHEN 650 MG RE SUPP: 650.0000 mg | Freq: Four times a day (QID) | RECTAL | Status: DC | PRN

## 2022-08-13 MED ORDER — LACTATED RINGERS IV BOLUS (SEPSIS)
1000.0000 mL | Freq: Once | INTRAVENOUS | Status: AC
  Administered 2022-08-13: 1000 mL via INTRAVENOUS

## 2022-08-13 MED ORDER — LACTATED RINGERS IV BOLUS (SEPSIS): 500.0000 mL | Freq: Once | INTRAVENOUS | Status: DC

## 2022-08-13 MED ORDER — LACTATED RINGERS IV SOLN: INTRAVENOUS | Status: AC

## 2022-08-13 MED ORDER — CITALOPRAM HYDROBROMIDE 20 MG PO TABS
10.0000 mg | ORAL_TABLET | Freq: Every day | ORAL | Status: DC
  Administered 2022-08-14 – 2022-08-17 (×4): 10 mg via ORAL
  Filled 2022-08-13 (×4): qty 1

## 2022-08-13 MED ORDER — ONDANSETRON HCL 4 MG/2ML IJ SOLN: 4.0000 mg | Freq: Four times a day (QID) | INTRAMUSCULAR | Status: DC | PRN

## 2022-08-13 MED ORDER — SODIUM CHLORIDE 0.9 % IV SOLN
2.0000 g | INTRAVENOUS | Status: DC
  Administered 2022-08-14 – 2022-08-16 (×3): 2 g via INTRAVENOUS
  Filled 2022-08-13 (×3): qty 12.5

## 2022-08-13 NOTE — ED Notes (Signed)
Pt 84% on RA. Pt placed on 3L [place].

## 2022-08-13 NOTE — H&P (Signed)
History and Physical    Patient: Jasmin Crane NTI:[id number] DOB: 01-04-1932 DOA: 08/13/2022 DOS: the patient was seen and examined on 08/13/2022 PCP: Hayden Rasmussen, MD  Patient coming from: ALF/ILF  Chief Complaint:  Chief Complaint  Patient presents with   Weakness   Back Pain   Emesis   Abdominal Pain   HPI: Jasmin Crane is a 88 y.o. female with medical history significant of sick sinus syndrome, carotid artery occlusion, fracture of the humerus, unspecified myopathy, peripheral vascular disease with claudication, CAD, cardiomegaly, chronic diastolic CHF, aortic valve regurgitation, hypertension, infrarenal AAA, hyperlipidemia, abnormal LFTs, Alzheimer's disease, hypothyroidism, emphysema, history of pneumonia, chronic constipation who was recently admitted and discharged from 08/02/2022 to 08/05/2022 due to sepsis due to Klebsiella and E. coli, biliary obstruction in the setting of cholangiocarcinoma undergoing an ERCP with biliary stent placement and stricture brushing for pathology by Dr. Carol Ada,.  After discharge, subsequently seen at the cancer clinic by Dr. Benay Spice, but deferred medical/surgical treatment due to age and comorbidities.  She is coming in today from [facility] due to bilateral lower back pain, generalized weakness and confusion.  She was found naked in bed.  She was brought to the ED subsequently by EMS.  Her daughter reported to Dr. Vallery Ridge that presentation was similar to recent sepsis.  She was doing well until 2 days ago when she started declining until today.  When I saw her, she was with her son.  She was confused but able to answer simple questions.  No headache, back or chest pain at this moment, but she was having RUQ abdominal pain.  She was also dyspneic and was on NRB mask oxygen.  ED course: 97.7 F, pulse 81, respiration 15, blood pressure 136/51 mmHg O2 sat 99% on room air.  Subsequently, the patient's blood pressure  decreased to 85%.  She is on NRB oxygen at this time.  She received 1500 mL of LR bolus, cefepime 2 g IVPB, metronidazole 500 mg IVPB.  Lab work: Her CBC showed a white count of 17.8 with 93% neutrophils, hemoglobin 10.4 g/dL and platelets 157.  Lipase was 35.  Influenza, coronavirus and RSV was negative.  Lactic acid 3.7 then 3.3 mmol/L.  Troponin was 116 then 143 ng/L.  CMP showed a CO2 of 21 mmol/L with a normal anion gap, the rest of the electrolytes were normal.  Glucose 143, BUN 24 and creatinine 1.04 mg/dL with a GFR of 52.  Total protein 6.8 and albumin 3.0 g/dL.  AST 138, ALT 77 alkaline phosphatase 486 units/L.  Total bilirubin 1.2 mg/dL.  Imaging: CT lumbar spine without contrast showed diffuse osteopenia with chronic T12 inferior endplate compression fracture.  Advanced lumbar disc degeneration with multilevel spinal stenosis.  Partially visible grossly stable infrarenal abdominal aortic aneurysm, aortic atherosclerosis biliary stent.  Small layering pleural effusions and lung base atelectasis.  Portable 1 view chest radiograph with asymmetric patchy opacity in the central left upper lobe, suspicious for pneumonia.  There is cardiomegaly and COPD.   Review of Systems: As mentioned in the history of present illness. All other systems reviewed and are negative. Past Medical History:  Diagnosis Date   Arrhythmia    Cancer [facility])    skin cancer 2005   Carotid artery occlusion    Claudication of lower extremity (HCC)    Complication of anesthesia    one bad experience 30-40  yrs ago, since had procedures went okay   Coronary artery disease  Diverticulitis    Hyperlipidemia    Hypertension    Hypothyroidism    Myopathy    Paralysis of tongue    Sick sinus syndrome [facility])    Past Surgical History:  Procedure Laterality Date   APPENDECTOMY     BILIARY BRUSHING  07/21/2022   Procedure: BILIARY BRUSHING;  Surgeon: Carol Ada, MD;  Location: [facility];  Service: Gastroenterology;;    BILIARY BRUSHING  08/04/2022   Procedure: BILIARY BRUSHING;  Surgeon: Carol Ada, MD;  Location: [facility];  Service: Gastroenterology;;   BILIARY STENT PLACEMENT [name] 07/21/2022   Procedure: BILIARY STENT PLACEMENT;  Surgeon: Carol Ada, MD;  Location: [facility];  Service: Gastroenterology;  Laterality: [name];   BILIARY STENT PLACEMENT [name] 08/04/2022   Procedure: BILIARY STENT PLACEMENT;  Surgeon: Carol Ada, MD;  Location: [facility];  Service: Gastroenterology;  Laterality: [name];   CAROTID ENDARTERECTOMY     CATARACT EXTRACTION, BILATERAL     CESAREAN SECTION     COLONOSCOPY     CORONARY ANGIOPLASTY     CORONARY STENT PLACEMENT     right coronary artery 1998   ENDOSCOPIC RETROGRADE CHOLANGIOPANCREATOGRAPHY (ERCP) WITH PROPOFOL [name] 07/21/2022   Procedure: ENDOSCOPIC RETROGRADE CHOLANGIOPANCREATOGRAPHY (ERCP) WITH PROPOFOL;  Surgeon: Carol Ada, MD;  Location: [facility];  Service: Gastroenterology;  Laterality: [name];   ERCP [name] 08/04/2022   Procedure: ENDOSCOPIC RETROGRADE CHOLANGIOPANCREATOGRAPHY (ERCP);  Surgeon: Carol Ada, MD;  Location: [facility];  Service: Gastroenterology;  Laterality: [name];   ESOPHAGOGASTRODUODENOSCOPY (EGD) WITH PROPOFOL [name] 07/21/2022   Procedure: ESOPHAGOGASTRODUODENOSCOPY (EGD) WITH PROPOFOL;  Surgeon: Carol Ada, MD;  Location: [facility];  Service: Gastroenterology;  Laterality: [name];   EUS [name] 07/21/2022   Procedure: UPPER ENDOSCOPIC ULTRASOUND (EUS) LINEAR;  Surgeon: Carol Ada, MD;  Location: [facility];  Service: Gastroenterology;  Laterality: [name];   INGUINAL HERNIA REPAIR Right 02/25/2019   Procedure: RIGHT INGUINAL HERNIA REPAIR WITH MESH;  Surgeon: Donnie Mesa, MD;  Location: [facility];  Service: General;  Laterality: Right;  LMA AND TAP BLOCK   MULTIPLE TOOTH EXTRACTIONS     SALIVARY GLAND SURGERY     removed 1998   SALPINGECTOMY     SKIN CANCER EXCISION     2005   SPHINCTEROTOMY  07/21/2022   Procedure:  SPHINCTEROTOMY;  Surgeon: Carol Ada, MD;  Location: [facility];  Service: Gastroenterology;;   Lavell Islam REMOVAL  08/04/2022   Procedure: STENT REMOVAL;  Surgeon: Carol Ada, MD;  Location: [facility];  Service: Gastroenterology;;   Social History:  reports that she has quit smoking. Her smoking use included cigarettes. She has never used smokeless tobacco. She reports current alcohol use. She reports that she does not use drugs.  Allergies  Allergen Reactions   Other Anaphylaxis and Swelling    Scallops   Shellfish Allergy Anaphylaxis and Swelling   Cozaar [Losartan] Nausea Only   Pletaal [Cilostazol] Palpitations    Family History  Problem Relation Age of Onset   Cancer Mother    Stroke Mother    Heart disease Father 16       CAD   Heart failure Sister    Arthritis Brother    Lung cancer Brother    Pancreatic cancer Brother    Heart attack Son 24    Prior to Admission medications   Medication Sig Start Date End Date Taking? Authorizing Provider  amoxicillin-clavulanate (AUGMENTIN) 500-125 MG tablet Take 1 tablet by mouth 2 (two) times daily for 10 days. 08/05/22 08/15/22  Kayleen Memos, DO  aspirin  EC 81 MG tablet Take 81 mg by mouth daily.    [provider]  cholecalciferol (VITAMIN D3) 25 MCG (1000 UNIT) tablet Take 1,000 Units by mouth daily.    [provider]  citalopram (CELEXA) 10 MG tablet Take 10 mg by mouth daily. 12/27/20   [provider]  Cyanocobalamin (VITAMIN B12 PO) Take 1 tablet by mouth daily.    [provider]  donepezil (ARICEPT) 10 MG tablet Take 10 mg by mouth in the morning. 01/17/21   [provider]  ipratropium (ATROVENT) 0.06 % nasal spray Place 2 sprays into both nostrils See admin instructions. Instill 2 sprays into each nostril 1-2 times a day    [provider]  levothyroxine (SYNTHROID) 88 MCG tablet Take 88 mcg by mouth at bedtime. 05/26/21   [provider]  METAMUCIL FIBER PO  Take by mouth See admin instructions. Mix 1 tablespoonful into the appropriate amount of water or beverage and drink once a day    [provider]  metoprolol succinate (TOPROL-XL) 25 MG 24 hr tablet TAKE 1 TABLET BY MOUTH EVERY DAY Patient taking differently: Take 25 mg by mouth in the morning. 07/31/22   Minus Breeding, MD  potassium chloride (KLOR-CON) 20 MEQ packet Take 20 mEq by mouth daily.    [provider]  rosuvastatin (CRESTOR) 40 MG tablet TAKE 1 TABLET BY MOUTH NIGHTLY AT BEDTIME Patient taking differently: Take 40 mg by mouth at bedtime. 08/10/21   Minus Breeding, MD  saccharomyces boulardii (FLORASTOR) 250 MG capsule Take 1 capsule (250 mg total) by mouth 2 (two) times daily. 08/05/22 09/04/22  Kayleen Memos, DO    Physical Exam: Vitals:   08/13/22 0835 08/13/22 0838 08/13/22 1116 08/13/22 1119  BP: (!) 136/51  (!) 150/70   Pulse: 81  88 90  Resp: 15  (!) 27 (!) 30  Temp: 97.7 F (36.5 C)   100.1 F (37.8 C)  TempSrc: Oral   Oral  SpO2:   (!) 85% (!) 84%  Weight:  44.9 kg    Height:  '5\' 2"'$  (1.575 m)     Physical Exam Vitals and nursing note reviewed.  Constitutional:      Appearance: She is well-developed and normal weight. She is ill-appearing.     Interventions: Face mask in place.  HENT:     Head: Normocephalic.     Nose: No rhinorrhea.     Mouth/Throat:     Mouth: Mucous membranes are dry.  Eyes:     General: No scleral icterus.    Pupils: Pupils are equal, round, and reactive to light.  Neck:     Vascular: No JVD.  Cardiovascular:     Rate and Rhythm: Normal rate and regular rhythm.     Heart sounds: S1 normal and S2 normal.  Pulmonary:     Effort: Pulmonary effort is normal. Tachypnea present. No accessory muscle usage.     Breath sounds: Examination of the right-lower field reveals decreased breath sounds. Examination of the left-lower field reveals decreased breath sounds. Decreased breath sounds present. No wheezing, rhonchi or  rales.  Abdominal:     General: Abdomen is flat. There is no distension.     Palpations: Abdomen is soft.     Tenderness: There is abdominal tenderness in the right upper quadrant and epigastric area. There is no guarding or rebound.  Musculoskeletal:     Cervical back: Neck supple.     Right lower leg: No edema.  Left lower leg: No edema.  Skin:    General: Skin is warm and dry.  Neurological:     General: No focal deficit present.     Mental Status: She is alert. She is disoriented.  Psychiatric:        Mood and Affect: Mood normal.        Behavior: Behavior is cooperative.     Data Reviewed:  Results are pending, will review when available.  Assessment and Plan: Principal Problem:   Acute respiratory failure with hypoxia ([place]) In the setting of:   Sepsis due to undetermined organism ([place]) Admit to PCU/inpatient. Continue oxygen supplementation. Continue IV fluids. Continue cefepime 2 g every 8 hours.   Continue metronidazole 500 mg IVPB q 12 hr. Continue vancomycin per pharmacy. Follow-up blood culture and sensitivity Follow CBC and CMP in a.m.  Active Problems:   Abnormal LFTs In the setting of:   Cholangiocarcinoma at hepatic [facility]ilum [facility]) Has elected to not undergo treatment.    Essential hypertension Hold antihypertensives for now.    Coronary artery disease due to lipid rich plaque Associated with:   Elevated troponin No complaints of chest pain. This is likely demand ischemia. Continue aspirin. Holding beta-blocker and statin.    AAA (abdominal aortic aneurysm) without rupture ([place]) Follow-up recommended for 3 years.    Dyslipidemia Hold statin.    Chronic diastolic CHF (congestive heart failure) (HCC) No clinical signs of decompensation.    Hypothyroidism Continue levothyroxine 88 mcg p.o. daily.    Alzheimer disease ([place]) Supportive care.    Normocytic anemia Monitor hematocrit and hemoglobin.    Mild protein malnutrition ([place]) In  the setting of cancer and acute illness. Protein supplementation.    Advance Care Planning:   Code Status: DNR.  Consults:   Family Communication: Her son was at bedside.  Severity of Illness: The appropriate patient status for this patient is INPATIENT. Inpatient status is judged to be reasonable and necessary in order to provide the required intensity of service to ensure the patient's safety. The patient's presenting symptoms, physical exam findings, and initial radiographic and laboratory data in the context of their chronic comorbidities is felt to place them at high risk for further clinical deterioration. Furthermore, it is not anticipated that the patient will be medically stable for discharge from the hospital within 2 midnights of admission.   * I certify that at the point of admission it is my clinical judgment that the patient will require inpatient hospital care spanning beyond 2 midnights from the point of admission due to high intensity of service, high risk for further deterioration and high frequency of surveillance required.*  Author: Reubin Milan, MD 08/13/2022 1:23 PM  For on call review [email or web address].   This document was prepared using Dragon voice recognition software and may contain some unintended transcription errors.

## 2022-08-13 NOTE — Sepsis Progress Note (Signed)
Elinik following code sepsis

## 2022-08-13 NOTE — ED Notes (Signed)
Date and time results received: 08/13/22 1530 (use smartphrase ".now" to insert current time)  Test: Trop Critical Value: 143  Name of Provider Notified: Dr. Olevia Bowens  Orders Received? Or Actions Taken?: see chart

## 2022-08-13 NOTE — ED Notes (Signed)
Pt 85% on 6L [place]. Pt placed on NRB at 15L/ Dr. Olevia Bowens notified at this time.

## 2022-08-13 NOTE — ED Provider Triage Note (Signed)
Emergency Medicine Provider Triage Evaluation Note  Jasmin Crane , a 88 y.o. female  was evaluated in triage.  Pt complains of low back pain, lower abdominal pain.  She slid out of bed this morning and fell onto her arms.  Has had low back pain for the past 2 to 3 weeks.  No prior falls.  Reporting urinary frequency without dysuria.  Denies saddle paresthesias, fecal incontinence.  Has urinary incontinence at baseline with no recent change.  No fevers, chest pain or shortness of breath.  Review of Systems  Positive: As above Negative: As above  Physical Exam  BP (!) 136/51 (BP Location: Right Arm)   Pulse 81   Temp 97.7 F (36.5 C) (Oral)   Resp 15   Ht '5\' 2"'$  (1.575 m)   Wt 44.9 kg   SpO2 99%   BMI 18.11 kg/m  Gen:   Awake, no distress   Resp:  Normal effort  MSK:   Moves extremities without difficulty  Other:  Bruising to right hand, skin tear to right forearm tremor with movement  Medical Decision Making  Medically screening exam initiated at 8:57 AM.  Appropriate orders placed.  Jasmin Crane was informed that the remainder of the evaluation will be completed by another provider, this initial triage assessment does not replace that evaluation, and the importance of remaining in the ED until their evaluation is complete.     Roylene Reason, [place] 08/13/22 [phone]

## 2022-08-13 NOTE — ED Triage Notes (Signed)
Per EMS, Pt, from [facility], presents w/ weakness, bilateral low back pain, and n/v.  Pt has a skin tear to R elbow as well.  EMS reports they were initially called out for a "lift assist from the restroom," but Pt was found sitting naked on her bed.  '4mg'$  Zofran given en route.

## 2022-08-13 NOTE — ED Notes (Signed)
Date and time results received: 08/13/22 12:23 PM  (use smartphrase ".now" to insert current time)  Test: Lactic acid Critical Value: 3.7  Name of Provider Notified: Dr. Melodye Ped  Orders Received? Or Actions Taken?: see chart

## 2022-08-13 NOTE — ED Notes (Signed)
Respiratory called to place patient on High Flow [place] due to patient unable to transition from non-rebreather mask to  without oxygen desaturation.

## 2022-08-13 NOTE — Progress Notes (Signed)
A consult was received from an ED physician for cefepime per pharmacy dosing.  The patient's profile has been reviewed for ht/wt/allergies/indication/available labs.   A one time order has been placed for cefepime 2g.  Further antibiotics/pharmacy consults should be ordered by admitting physician if indicated.                       Thank you, Kara Mead 08/13/2022  12:27 PM

## 2022-08-13 NOTE — Progress Notes (Signed)
Pharmacy Antibiotic Note  Jasmin Crane is a 88 y.o. female admitted on 08/13/2022 with  IAI .  Pharmacy has been consulted for cefepime dosing.  Plan: Cefepime 2g IV q24 per current renal function  Height: '5\' 2"'$  (157.5 cm) Weight: 44.9 kg (99 lb) IBW/kg (Calculated) : 50.1  Temp (24hrs), Avg:100.1 F (37.8 C), Min:97.7 F (36.5 C), Max:102.4 F (39.1 C)  Recent Labs  Lab 08/13/22 0915 08/13/22 1141 08/13/22 1341  WBC 17.8*  --   --   CREATININE 1.04*  --   --   LATICACIDVEN  --  3.7* 3.3*    Estimated Creatinine Clearance: 26.5 mL/min (A) (by C-G formula based on SCr of 1.04 mg/dL (H)).    Allergies  Allergen Reactions   Other Anaphylaxis and Swelling    Scallops   Shellfish Allergy Anaphylaxis and Swelling   Cozaar [Losartan] Nausea Only   Pletaal [Cilostazol] Palpitations      Thank you for allowing pharmacy to be a part of this patient's care.  Kara Mead 08/13/2022 6:18 PM

## 2022-08-13 NOTE — ED Notes (Signed)
When giving pt oral tylenol she was unable to swallow tablet and only held it on tongue. Tablet was removed from mouth and NP James aware at this time.

## 2022-08-13 NOTE — ED Notes (Signed)
Unable to get 2nd set of blood cultures.

## 2022-08-13 NOTE — ED Provider Notes (Signed)
[facility] DEPT Provider Note   CSN: [id number] Arrival date & time: 08/13/22  1062     History  Chief Complaint  Patient presents with   Weakness   Back Pain   Emesis   Abdominal Pain    Jasmin Crane is a 88 y.o. female.  HPI Jasmin Crane Was Seen in the Emergency Room on 08/02/2022 with Rigors.  The Liver Enzymes Were Elevated and a Blood Culture Returned Positive for E. coli and Klebsiella.  He Was Admitted and Placed on IV Antibiotics.  She Was Transition to Augmentin at Discharge on 08/05/2022. Dr. Benson Norway Took Her to a Repeat ERCP on 08/04/2022.  A Single Stricture Was Found in the Common Bile Duct and Common Hepatic Duct.  The Stricture Was Malignant Appearing.  Brushings Were Obtained.  The Original Stent Was Removed.  A Plastic Stent Was Placed in the Common Hepatic Duct.  The cytology from the biliary stricture brushing revealed malignant cells consistent with adenocarcinoma.  Patient just read only diagnosed as per above with adenocarcinoma and biliary obstruction.  Patient is at [facility] assisted living and today was significantly weak requiring assistance to get up.  She also began exhibiting confusion and was found sitting naked on her bed.  Patient is a poor historian this time.  Intermittently she is lucid and giving some coordinated history but also is exhibiting delirium and tangential thoughts.  Patient's daughter reports that patient had very similar presentation when she was septic.  Daughter reports she is still on antibiotics and was doing pretty well up until 2 days ago, this came on very quickly.  Due to her diagnosis of biliary adenocarcinoma, patient all reports that they had a meeting scheduled with hospice tomorrow to discuss goals of care.    Home Medications Prior to Admission medications   Medication Sig Start Date End Date Taking? Authorizing Provider  amoxicillin-clavulanate (AUGMENTIN) 500-125 MG tablet Take 1 tablet by  mouth 2 (two) times daily for 10 days. 08/05/22 08/15/22  Kayleen Memos, DO  aspirin EC 81 MG tablet Take 81 mg by mouth daily.    [provider]  cholecalciferol (VITAMIN D3) 25 MCG (1000 UNIT) tablet Take 1,000 Units by mouth daily.    [provider]  citalopram (CELEXA) 10 MG tablet Take 10 mg by mouth daily. 12/27/20   [provider]  Cyanocobalamin (VITAMIN B12 PO) Take 1 tablet by mouth daily.    [provider]  donepezil (ARICEPT) 10 MG tablet Take 10 mg by mouth in the morning. 01/17/21   [provider]  ipratropium (ATROVENT) 0.06 % nasal spray Place 2 sprays into both nostrils See admin instructions. Instill 2 sprays into each nostril 1-2 times a day    [provider]  levothyroxine (SYNTHROID) 88 MCG tablet Take 88 mcg by mouth at bedtime. 05/26/21   [provider]  METAMUCIL FIBER PO Take by mouth See admin instructions. Mix 1 tablespoonful into the appropriate amount of water or beverage and drink once a day    [provider]  metoprolol succinate (TOPROL-XL) 25 MG 24 hr tablet TAKE 1 TABLET BY MOUTH EVERY DAY Patient taking differently: Take 25 mg by mouth in the morning. 07/31/22   Minus Breeding, MD  potassium chloride (KLOR-CON) 20 MEQ packet Take 20 mEq by mouth daily.    [provider]  rosuvastatin (CRESTOR) 40 MG tablet TAKE 1 TABLET BY MOUTH NIGHTLY AT BEDTIME Patient taking differently: Take 40 mg by  mouth at bedtime. 08/10/21   Minus Breeding, MD  saccharomyces boulardii (FLORASTOR) 250 MG capsule Take 1 capsule (250 mg total) by mouth 2 (two) times daily. 08/05/22 09/04/22  Kayleen Memos, DO      Allergies    Other, Shellfish allergy, Cozaar [losartan], and Pletaal [cilostazol]    Review of Systems   Review of Systems  Physical Exam Updated Vital Signs BP (!) 150/70   Pulse 90   Temp 100.1 F (37.8 C) (Oral)   Resp (!) 30   Ht '5\' 2"'$  (1.575 m)   Wt 44.9 kg   SpO2 (!) 84%   BMI  18.11 kg/m  Physical Exam Constitutional:      Comments: Well-nourished well-developed.  Good physical condition for age.  Patient is confused and tachypneic.  HENT:     Head: Normocephalic and atraumatic.     Mouth/Throat:     Mouth: Mucous membranes are dry.     Pharynx: Oropharynx is clear.  Eyes:     Extraocular Movements: Extraocular movements intact.     Conjunctiva/sclera: Conjunctivae normal.  Cardiovascular:     Rate and Rhythm: Regular rhythm. Tachycardia present.  Pulmonary:     Comments: Tachypnea.  Speaking in short full sentences.  Bilateral breath sounds present.  Difficult auscultation due to positioning and patient cooperation.  No gross rhonchi or crackle. Abdominal:     Comments: Mild abdominal distention.  Moderate right upper quadrant tenderness.  No guarding.  Musculoskeletal:        General: No swelling or tenderness. Normal range of motion.     Cervical back: Neck supple.     Right lower leg: No edema.     Left lower leg: No edema.  Skin:    General: Skin is warm and dry.     Coloration: Skin is pale.  Neurological:     Comments: Patient is confused.  She has tangential thoughts and findings consistent with delirium.  Intermittently focuses appropriately and then randomly exhibits confusion.  No focal motor deficits.     ED Results / Procedures / Treatments   Labs (all labs ordered are listed, but only abnormal results are displayed) Labs Reviewed  CBC WITH DIFFERENTIAL/PLATELET - Abnormal; Notable for the following components:      Result Value   WBC 17.8 (*)    Hemoglobin 10.4 (*)    HCT 34.3 (*)    RDW 18.6 (*)    Neutro Abs 16.6 (*)    Lymphs Abs 0.3 (*)    Abs Immature Granulocytes 0.11 (*)    All other components within normal limits  COMPREHENSIVE METABOLIC PANEL - Abnormal; Notable for the following components:   CO2 21 (*)    Glucose, Bld 143 (*)    BUN 24 (*)    Creatinine, Ser 1.04 (*)    Albumin 3.0 (*)    AST 138 (*)    ALT 77  (*)    Alkaline Phosphatase 486 (*)    GFR, Estimated 52 (*)    All other components within normal limits  LACTIC ACID, PLASMA - Abnormal; Notable for the following components:   Lactic Acid, Venous 3.7 (*)    All other components within normal limits  CULTURE, BLOOD (ROUTINE X 2)  CULTURE, BLOOD (ROUTINE X 2)  RESP PANEL BY RT-PCR (RSV, FLU A&B, COVID)  [id number]  LIPASE, BLOOD  URINALYSIS, ROUTINE W REFLEX MICROSCOPIC  LACTIC ACID, PLASMA    EKG None  Radiology CT Lumbar Spine Wo Contrast  Result Date: 08/13/2022 CLINICAL DATA:  88 year old female with back pain, weakness, vomiting. Recent ERCP. EXAM: CT LUMBAR SPINE WITHOUT CONTRAST TECHNIQUE: Multidetector CT imaging of the lumbar spine was performed without intravenous contrast administration. Multiplanar CT image reconstructions were also generated. RADIATION DOSE REDUCTION: This exam was performed according to the departmental dose-optimization program which includes automated exposure control, adjustment of the mA and/or kV according to patient size and/or use of iterative reconstruction technique. COMPARISON:  CT Abdomen and Pelvis 08/02/2022. FINDINGS: Segmentation: Normal. Alignment: Stable. Mild S-shaped thoracolumbar scoliosis, relatively preserved lumbar lordosis. Vertebrae: Diffuse osteopenia. Mild to moderate T12 inferior endplate fracture appears unchanged. No acute osseous abnormality identified. Visible sacrum and SI joints appear intact. Paraspinal and other soft tissues: Infrarenal abdominal aortic aneurysm again partially visible, diameter grossly stable at 31 mm. Underlying Aortoiliac calcified atherosclerosis. Vascular patency is not evaluated in the absence of IV contrast. A biliary stent is visible at the liver hilum and duodenum. Other visible noncontrast abdominal viscera appear negative. Partially visible small layering pleural effusions and lung base atelectasis. Lumbar paraspinal soft tissues within normal limits.  Disc levels: Diffuse advanced lumbar disc degeneration. Vacuum disc throughout. Associated multilevel multifactorial lumbar spinal stenosis appears stable from the recent CT this month, mild at L2-L3, moderate to severe at L3-L4, severe at L4-L5. IMPRESSION: 1. Diffuse osteopenia. Chronic T12 inferior endplate compression fracture. No acute osseous abnormality in the Lumbar Spine. 2. Advanced lumbar disc degeneration with multilevel spinal stenosis, stable since 08/02/2022 and moderate or severe at L3-L4 and L4-L5. 3. Partially visible grossly stable infrarenal abdominal aortic aneurysm, 3.1 cm. Recommend follow-up every 3 years. Reference: J Am Coll Radiol 2831;51:761-607. Aortic aneurysm NOS (ICD10-I71.9). Aortic Atherosclerosis (ICD10-I70.0). 4. Biliary stent. Small layering pleural effusions and lung base atelectasis. Electronically Signed   By: Genevie Ann M.D.   On: 08/13/2022 09:38    Procedures Procedures   CRITICAL CARE Performed by: Charlesetta Shanks   Total critical care time: 30 minutes  Critical care time was exclusive of separately billable procedures and treating other patients.  Critical care was necessary to treat or prevent imminent or life-threatening deterioration.  Critical care was time spent personally by me on the following activities: development of treatment plan with patient and/or surrogate as well as nursing, discussions with consultants, evaluation of patient's response to treatment, examination of patient, obtaining history from patient or surrogate, ordering and performing treatments and interventions, ordering and review of laboratory studies, ordering and review of radiographic studies, pulse oximetry and re-evaluation of patient's condition.  Medications Ordered in ED Medications  lactated ringers infusion (has no administration in time range)  lactated ringers bolus 1,000 mL (has no administration in time range)    And  lactated ringers bolus 500 mL (has no  administration in time range)  ceFEPIme (MAXIPIME) 2 g in sodium chloride 0.9 % 100 mL IVPB (has no administration in time range)  metroNIDAZOLE (FLAGYL) IVPB 500 mg (has no administration in time range)    ED Course/ Medical Decision Making/ A&P                           Medical Decision Making Amount and/or Complexity of Data Reviewed Labs: ordered.   Patient presents with confusion, hypoxia, tachypneic and fever.  Medical history is significant for biliary stent and recent adenocarcinoma.  Patient is clinically ill with findings suggestive of sepsis\metabolic derangement/possible PE\COVID\influenza.  Will proceed with broad diagnostic evaluation.  Additional history obtained from the  patient's daughter who arrived at bedside.  Additional history from review of EMR.  Labs have returned with lactic acidosis and leukocytosis.  Findings concerning for sepsis will initiate broad-spectrum antibiotic and fluid resuscitation.  Consideration given for intra-abdominal source with history of recent replacement of biliary stent.  Proximately 1 week ago cultures were positive for E. coli and Klebsiella.  Patient is high risk for bacteremia.  Patient's medical history combined with diagnostic evaluation and clinical appearance indicate patient will require hospitalization.        Final Clinical Impression(s) / ED Diagnoses Final diagnoses:  Sepsis, due to unspecified organism, unspecified whether acute organ dysfunction present [facility])  Hepatic adenocarcinoma ([place])  Severe comorbid illness    Rx / DC Orders ED Discharge Orders     None         Charlesetta Shanks, MD 08/13/22 1239

## 2022-08-13 NOTE — Progress Notes (Signed)
[employer]ufacturing [employer] [facility]) Hospital Liaison Note  This is a pending [facility] hosp[facility] patient. Please call prior to discharge.   Call with any hospice related questions or concerns. Thank you  [facility] [place] Liaison [phone]

## 2022-08-14 ENCOUNTER — Other Ambulatory Visit: Payer: Self-pay | Admitting: Cardiology

## 2022-08-14 ENCOUNTER — Other Ambulatory Visit: Payer: Self-pay

## 2022-08-14 ENCOUNTER — Inpatient Hospital Stay (HOSPITAL_COMMUNITY): Payer: Medicare Other

## 2022-08-14 DIAGNOSIS — A419 Sepsis, unspecified organism: Secondary | ICD-10-CM | POA: Diagnosis not present

## 2022-08-14 LAB — URINALYSIS, ROUTINE W REFLEX MICROSCOPIC
Bacteria, UA: NONE SEEN
Bilirubin Urine: NEGATIVE
Glucose, UA: NEGATIVE mg/dL
Ketones, ur: NEGATIVE mg/dL
Leukocytes,Ua: NEGATIVE
Nitrite: NEGATIVE
Protein, ur: 100 mg/dL — AB
Specific Gravity, Urine: 1.016 (ref 1.005–1.030)
pH: 6 (ref 5.0–8.0)

## 2022-08-14 LAB — BLOOD CULTURE ID PANEL (REFLEXED) - BCID2

## 2022-08-14 LAB — BRAIN NATRIURETIC PEPTIDE: B Natriuretic Peptide: 2131 pg/mL — ABNORMAL HIGH (ref 0.0–100.0)

## 2022-08-14 LAB — RESPIRATORY PANEL BY PCR

## 2022-08-14 LAB — CBC
HCT: 28.1 % — ABNORMAL LOW (ref 36.0–46.0)
Hemoglobin: 8.6 g/dL — ABNORMAL LOW (ref 12.0–15.0)
MCH: 27.2 pg (ref 26.0–34.0)
MCHC: 30.6 g/dL (ref 30.0–36.0)
MCV: 88.9 fL (ref 80.0–100.0)
Platelets: 120 10*3/uL — ABNORMAL LOW (ref 150–400)
RBC: 3.16 MIL/uL — ABNORMAL LOW (ref 3.87–5.11)
RDW: 18.8 % — ABNORMAL HIGH (ref 11.5–15.5)
WBC: 13.1 10*3/uL — ABNORMAL HIGH (ref 4.0–10.5)
nRBC: 0 % (ref 0.0–0.2)

## 2022-08-14 LAB — COMPREHENSIVE METABOLIC PANEL
ALT: 74 U/L — ABNORMAL HIGH (ref 0–44)
AST: 136 U/L — ABNORMAL HIGH (ref 15–41)
Albumin: 2.4 g/dL — ABNORMAL LOW (ref 3.5–5.0)
Alkaline Phosphatase: 317 U/L — ABNORMAL HIGH (ref 38–126)
Anion gap: 7 (ref 5–15)
BUN: 26 mg/dL — ABNORMAL HIGH (ref 8–23)
CO2: 25 mmol/L (ref 22–32)
Calcium: 8.2 mg/dL — ABNORMAL LOW (ref 8.9–10.3)
Chloride: 104 mmol/L (ref 98–111)
Creatinine, Ser: 1.05 mg/dL — ABNORMAL HIGH (ref 0.44–1.00)
GFR, Estimated: 51 mL/min — ABNORMAL LOW (ref >60)
Glucose, Bld: 111 mg/dL — ABNORMAL HIGH (ref 70–99)
Potassium: 3.9 mmol/L (ref 3.5–5.1)
Sodium: 136 mmol/L (ref 135–145)
Total Bilirubin: 2.9 mg/dL — ABNORMAL HIGH (ref 0.3–1.2)
Total Protein: 5.6 g/dL — ABNORMAL LOW (ref 6.5–8.1)

## 2022-08-14 LAB — EXPECTORATED SPUTUM ASSESSMENT W GRAM STAIN, RFLX TO RESP C

## 2022-08-14 LAB — MRSA NEXT GEN BY PCR, NASAL: MRSA by PCR Next Gen: NOT DETECTED

## 2022-08-14 LAB — PROTIME-INR
INR: 1.6 — ABNORMAL HIGH (ref 0.8–1.2)
Prothrombin Time: 19.2 seconds — ABNORMAL HIGH (ref 11.4–15.2)

## 2022-08-14 MED ORDER — VANCOMYCIN HCL 750 MG/150ML IV SOLN
750.0000 mg | Freq: Once | INTRAVENOUS | Status: AC
  Administered 2022-08-14: 750 mg via INTRAVENOUS
  Filled 2022-08-14: qty 150

## 2022-08-14 MED ORDER — ORAL CARE MOUTH RINSE
15.0000 mL | OROMUCOSAL | Status: DC
  Administered 2022-08-14 – 2022-08-16 (×5): 15 mL via OROMUCOSAL

## 2022-08-14 MED ORDER — VANCOMYCIN HCL 750 MG/150ML IV SOLN: 750.0000 mg | INTRAVENOUS | Status: DC

## 2022-08-14 MED ORDER — CHLORHEXIDINE GLUCONATE CLOTH 2 % EX PADS
6.0000 | MEDICATED_PAD | Freq: Every day | CUTANEOUS | Status: DC
  Administered 2022-08-14 – 2022-08-16 (×2): 6 via TOPICAL

## 2022-08-14 MED ORDER — ORAL CARE MOUTH RINSE: 15.0000 mL | OROMUCOSAL | Status: DC | PRN

## 2022-08-14 MED ORDER — IBUPROFEN 200 MG PO TABS
200.0000 mg | ORAL_TABLET | Freq: Once | ORAL | Status: AC
  Administered 2022-08-14: 200 mg via ORAL
  Filled 2022-08-14: qty 1

## 2022-08-14 NOTE — Progress Notes (Signed)
PROGRESS NOTE Jasmin Crane  PYK:[id number] DOB: 1932-07-22 DOA: 08/13/2022 PCP: Hayden Rasmussen, MD   Brief Narrative/Hospital Course: 88 y.o.f w SSS carotid artery occlusion, fracture of the humerus, unspecified myopathy, peripheral vascular disease with claudication, CAD, cardiomegaly, chronic diastolic CHF, aortic valve regurgitation, hypertension, infrarenal AAA, hyperlipidemia, abnormal LFTs, Alzheimer's disease, hypothyroidism, emphysema, history of pneumonia, chronic constipation, recent admission 08/02/2022 to 08/05/2022 due to sepsis due to Klebsiella and E. coli, biliary obstruction in the setting of cholangiocarcinoma s/p ERCP with biliary stent placement and stricture brushing for pathology by Dr. Carol Ada then after discharge was seen at the cancer clinic by Dr. Benay Spice, but deferred medical/surgical treatment due to age and comorbidities, presented from assisted living facility [facility] due to bilateral lower back pain generalized weakness confusion/lethargy, wa found naked in bed.  Was declining 2 days PTA. She was also dyspneic and was on NRB mask oxygen. Seen in the ED on NRB and needing high flow nasal cannula overnight.   Labs obtained along with imaging: Obtained for sepsis with leukocytosis lactic acidosis transaminitis, COVID-19 influenza negative.  Blood culture urine culture sent.  Chest x-ray and CT lumbar spine done-showed diffuse osteopenia, chronic T12 endplate compression fracture, advanced lumbar DJD with stenosis, infrarenal stable abdominal aortic aneurysm 3.1 cm, biliary stent in place, chest x-ray asymmetric patchy opacity in the central left upper lobe suspicious for pneumonia. Admitted for further management  Subjective: Seen and examined this am On 15L HFNC overnight. Labs with improving wbc, hh down to 8.6 gm, creat stable, lfts up, tb up at 2.9 Patient's son is at the bedside he thinks he appears improved from yesterday Patient complains of being  cold having pain all over especially on the feet. She has multiple complaints but no abdominal pain today does not appear dyspneic.   Assessment and Plan:  Severe sepsis POA Left upper lobe pneumonia Recent Klebsiella and E. coli sepsis due to biliary obstruction from cholangiocarcinoma: Source likely pneumonia rule out biliary source.  Check ultrasound abdomen.  Continue current per external antibiotic with cefepime, Flagyl.  Chest ray shows worsening pneumonia on the left side> add vancomycin. Check mrsa swab, urine antigen and respiratory culture Recent Labs  Lab 08/13/22 0915 08/13/22 1141 08/13/22 1341 08/13/22 1831 08/14/22 0419  WBC 17.8*  --   --   --  13.1*  LATICACIDVEN  --  3.7* 3.3* 2.2*  --     Acute hypoxic respiratory failure still on high flow nasal cannula.  Continue supplemental oxygen continue antibiotics as above monitor closely she is DNR.  Will check BNP and chest x-ray this morning> BNP elevated to 131 hold further IV fluids as BP stable, chest x-ray with significant interval increase in left upper and left lower lobe pneumonia.  Speech eval and diet as tolerated  Abnormal LFTs Cholangiocarcinoma-due to age and comorbidities not on treatment followed by Dr. Ammie Dalton S/P biliary stent on 08/04/22: Trend LFTs, checking ultrasound abdomen> gallbladder not visualized, mild intrahepatic biliary duct dilatation, extrahepatic common bile duct 3 mm echogenic liver parenchyma.   Recent Labs  Lab 08/13/22 0915 08/14/22 0419  AST 138* 136*  ALT 77* 74*  ALKPHOS 486* 317*  BILITOT 1.2 2.9*  PROT 6.8 5.6*  ALBUMIN 3.0* 2.4*  INR  --  1.6*    AAA stable  Essential hypertension Hyperlipidemia CAD: No chest pain.  BP stable.  Continue aspirin.  Elevated troponin likely demand ischemia in the setting of sepsis:  Hypothyroidism: Continue Synthroid.  Chronic diastolic CHF: Monitor  fluid status volume status.  Given elevated BNP holding further IV fluids as well as BP  stable  Alzheimer's dementia Acute metabolic encephalopathy present with confusion lethargy: Mentation appears to be improving.  Continue delirium precaution fall precaution  Chronic anemia: Stable, monitor Recent Labs  Lab 08/13/22 0915 08/14/22 0419  HGB 10.4* 8.6*  HCT 34.3* 28.1*    Goals of care DNR prognosis is guarded palliative care consulted.  Patient family aware.  They were planning for meeting with outpatient hospice but has not happened yet.  High risk of decompensation.  DVT prophylaxis: SCDs Start: 08/13/22 1353 Code Status:   Code Status: DNR Family Communication: plan of care discussed with patient/son at bedside. Patient status is: Inpatient because of sepsis.  Given need for ongoing high flow nasal cannula we will request a stepdown bed Level of care: Progressive > step down. Dispo: The patient is from: Assisted living facility            Anticipated disposition: TBD  Objective: Vitals last 24 hrs: Vitals:   08/14/22 0730 08/14/22 0745 08/14/22 0800 08/14/22 0908  BP: (!) 135/56 (!) 141/63 (!) 129/51   Pulse: 76 82 79   Resp: (!) 25 (!) 22 17   Temp:    97.7 F (36.5 C)  TempSrc:    Oral  SpO2: 98% 98% 95%   Weight:      Height:       Weight change:   Physical Examination: General exam: alert awake, frail, older than stated age HEENT:Oral mucosa moist, Ear/Nose WNL grossly Respiratory system: bilaterally clear BS, crackles on left- chest, no use of accessory muscle Cardiovascular system: S1 & S2 +, No JVD. Gastrointestinal system: Abdomen soft,NT,ND, BS+ Nervous System:Alert, awake, moving extremities. Extremities: LE edema neg,distal peripheral pulses palpable.  Skin: No rashes,no icterus. MSK: Normal muscle bulk,tone, power  Medications reviewed:  Scheduled Meds:  aspirin EC  81 mg Oral Daily   citalopram  10 mg Oral Daily   donepezil  10 mg Oral Daily   levothyroxine  88 mcg Oral QHS   Continuous Infusions:  ceFEPime (MAXIPIME) IV      metronidazole Stopped (08/14/22 0413)   vancomycin     [START ON 08/16/2022] vancomycin      Diet Order             Diet full liquid Room service appropriate? Yes; Fluid consistency: Thin  Diet effective now                   Intake/Output Summary (Last 24 hours) at 08/14/2022 1045 Last data filed at 08/14/2022 0413 Gross per 24 hour  Intake 1159.05 ml  Output --  Net 1159.05 ml   Net IO Since Admission: 1,159.05 mL [08/14/22 1045]  Wt Readings from Last 3 Encounters:  08/13/22 44.9 kg  08/08/22 45.3 kg  08/04/22 43.4 kg    Unresulted Labs (From admission, onward)     Start     Ordered   08/15/22 0500  Comprehensive metabolic panel  Daily,   R      08/14/22 0909   08/15/22 0500  CBC  Daily,   R      08/14/22 0909   08/14/22 1023  MRSA Next Gen by PCR, Nasal  (MRSA Screening)  Once,   R        08/14/22 1022   08/13/22 1141  Blood culture (routine x 2)  BLOOD CULTURE X 2,   R (with STAT occurrences)  08/13/22 1140   08/13/22 0857  Urinalysis, Routine w reflex microscopic  (ED Abdominal Pain)  Once,   URGENT        08/13/22 0856          Data Reviewed: I have personally reviewed following labs and imaging studies CBC: Recent Labs  Lab 08/13/22 0915 08/14/22 0419  WBC 17.8* 13.1*  NEUTROABS 16.6*  --   HGB 10.4* 8.6*  HCT 34.3* 28.1*  MCV 88.2 88.9  PLT 157 875*   Basic Metabolic Panel: Recent Labs  Lab 08/13/22 0915 08/14/22 0419  NA 136 136  K 4.0 3.9  CL 104 104  CO2 21* 25  GLUCOSE 143* 111*  BUN 24* 26*  CREATININE 1.04* 1.05*  CALCIUM 9.2 8.2*   GFR: Estimated Creatinine Clearance: 26.3 mL/min (A) (by C-G formula based on SCr of 1.05 mg/dL (H)). Liver Function Tests: Recent Labs  Lab 08/13/22 0915 08/14/22 0419  AST 138* 136*  ALT 77* 74*  ALKPHOS 486* 317*  BILITOT 1.2 2.9*  PROT 6.8 5.6*  ALBUMIN 3.0* 2.4*   Recent Labs  Lab 08/13/22 0915  LIPASE 35   No results for input(s): "AMMONIA" in the last 168  hours. Coagulation Profile: Recent Labs  Lab 08/14/22 0419  INR 1.6*  Sepsis Labs: Recent Labs  Lab 08/13/22 1141 08/13/22 1341 08/13/22 1831  LATICACIDVEN 3.7* 3.3* 2.2*    Recent Results (from the past 240 hour(s))  Culture, blood (Routine X 2) w Reflex to ID Panel     Status: None   Collection Time: 08/05/22  5:02 AM   Specimen: BLOOD LEFT ARM  Result Value Ref Range Status   Specimen Description   Final    BLOOD LEFT ARM IN PEDIATRIC BOTTLE Performed at [facility] [address], [place], [place] [address]    Special Requests   Final    Blood Culture results may not be optimal due to an inadequate volume of blood received in culture bottles Performed at [facility] [address] [place], [place] [address]    Culture   Final    NO GROWTH 5 DAYS Performed at [facility], [place] [address] [place], [place] [address]    Report Status 08/10/2022 FINAL  Final  Culture, blood (Routine X 2) w Reflex to ID Panel     Status: Abnormal   Collection Time: 08/05/22  5:03 AM   Specimen: BLOOD LEFT ARM  Result Value Ref Range Status   Specimen Description   Final    BLOOD LEFT ARM IN PEDIATRIC BOTTLE Performed at [facility] [address], [place], [place] [address]    Special Requests   Final    Blood Culture results may not be optimal due to an inadequate volume of blood received in culture bottles Performed at [facility] [address], [place], [place] [address]    Culture  Setup Time   Final    GRAM POSITIVE COCCI IN CLUSTERS IN PEDIATRIC BOTTLE CRITICAL RESULT CALLED TO, READ BACK BY AND VERIFIED WITH: DR Horald Pollen [id number] AT 0815 BY EC    Culture (A)  Final    STAPHYLOCOCCUS EPIDERMIDIS THE SIGNIFICANCE OF ISOLATING THIS ORGANISM FROM A SINGLE SET OF BLOOD CULTURES WHEN MULTIPLE SETS ARE DRAWN IS UNCERTAIN. PLEASE NOTIFY THE MICROBIOLOGY DEPARTMENT WITHIN ONE WEEK IF  SPECIATION AND SENSITIVITIES ARE REQUIRED. Performed at [facility], [place] [address] [place],  [address]  Report Status 08/08/2022 FINAL  Final  Blood Culture ID Panel (Reflexed)     Status: Abnormal   Collection Time: 08/05/22  5:03 AM  Result Value Ref Range Status   Enterococcus faecalis NOT DETECTED NOT DETECTED Final   Enterococcus Faecium NOT DETECTED NOT DETECTED Final   Listeria monocytogenes NOT DETECTED NOT DETECTED Final   Staphylococcus species DETECTED (A) NOT DETECTED Final    Comment: CRITICAL RESULT CALLED TO, READ BACK BY AND VERIFIED WITH: DR Horald Pollen [id number] AT 0815 BY EC    Staphylococcus aureus (BCID) NOT DETECTED NOT DETECTED Final   Staphylococcus epidermidis DETECTED (A) NOT DETECTED Final    Comment: CRITICAL RESULT CALLED TO, READ BACK BY AND VERIFIED WITH: DR Horald Pollen [id number] AT 0815 BY EC    Staphylococcus lugdunensis NOT DETECTED NOT DETECTED Final   Streptococcus species NOT DETECTED NOT DETECTED Final   Streptococcus agalactiae NOT DETECTED NOT DETECTED Final   Streptococcus pneumoniae NOT DETECTED NOT DETECTED Final   Streptococcus pyogenes NOT DETECTED NOT DETECTED Final   A.calcoaceticus-baumannii NOT DETECTED NOT DETECTED Final   Bacteroides fragilis NOT DETECTED NOT DETECTED Final   Enterobacterales NOT DETECTED NOT DETECTED Final   Enterobacter cloacae complex NOT DETECTED NOT DETECTED Final   Escherichia coli NOT DETECTED NOT DETECTED Final   Klebsiella aerogenes NOT DETECTED NOT DETECTED Final   Klebsiella oxytoca NOT DETECTED NOT DETECTED Final   Klebsiella pneumoniae NOT DETECTED NOT DETECTED Final   Proteus species NOT DETECTED NOT DETECTED Final   Salmonella species NOT DETECTED NOT DETECTED Final   Serratia marcescens NOT DETECTED NOT DETECTED Final   Haemophilus influenzae NOT DETECTED NOT DETECTED Final   Neisseria meningitidis NOT DETECTED NOT DETECTED Final   Pseudomonas aeruginosa NOT DETECTED NOT  DETECTED Final   Stenotrophomonas maltophilia NOT DETECTED NOT DETECTED Final   Candida albicans NOT DETECTED NOT DETECTED Final   Candida auris NOT DETECTED NOT DETECTED Final   Candida glabrata NOT DETECTED NOT DETECTED Final   Candida krusei NOT DETECTED NOT DETECTED Final   Candida parapsilosis NOT DETECTED NOT DETECTED Final   Candida tropicalis NOT DETECTED NOT DETECTED Final   Cryptococcus neoformans/gattii NOT DETECTED NOT DETECTED Final   Methicillin resistance mecA/C NOT DETECTED NOT DETECTED Final    Comment: Performed at [facility], [address] [place], [place] [address]  Blood culture (routine x 2)     Status: None (Preliminary result)   Collection Time: 08/13/22 11:41 AM   Specimen: BLOOD LEFT WRIST  Result Value Ref Range Status   Specimen Description   Final    BLOOD LEFT WRIST Performed at [facility] [address] [place], [place] [address]    Special Requests   Final    BOTTLES DRAWN AEROBIC AND ANAEROBIC Blood Culture adequate volume Performed at [facility] [address], [place], [place] [address]    Culture  Setup Time   Final    GRAM NEGATIVE RODS AEROBIC BOTTLE ONLY Organism ID to follow Performed at [facility], [place] [address] [place], [place] [address]    Culture PENDING  Incomplete   Report Status PENDING  Incomplete  Blood Culture ID Panel (Reflexed)     Status: Abnormal   Collection Time: 08/13/22 11:41 AM  Result Value Ref Range Status   Enterococcus faecalis NOT DETECTED NOT DETECTED Final   Enterococcus Faecium NOT DETECTED NOT DETECTED Final   Listeria monocytogenes NOT DETECTED NOT DETECTED Final   Staphylococcus  species NOT DETECTED NOT DETECTED Final   Staphylococcus aureus (BCID) NOT DETECTED NOT DETECTED Final   Staphylococcus epidermidis NOT DETECTED NOT DETECTED Final   Staphylococcus lugdunensis NOT DETECTED NOT DETECTED Final   Streptococcus species NOT DETECTED NOT DETECTED Final    Streptococcus agalactiae NOT DETECTED NOT DETECTED Final   Streptococcus pneumoniae NOT DETECTED NOT DETECTED Final   Streptococcus pyogenes NOT DETECTED NOT DETECTED Final   A.calcoaceticus-baumannii NOT DETECTED NOT DETECTED Final   Bacteroides fragilis NOT DETECTED NOT DETECTED Final   Enterobacterales NOT DETECTED NOT DETECTED Final   Enterobacter cloacae complex NOT DETECTED NOT DETECTED Final   Escherichia coli NOT DETECTED NOT DETECTED Final   Klebsiella aerogenes NOT DETECTED NOT DETECTED Final   Klebsiella oxytoca NOT DETECTED NOT DETECTED Final   Klebsiella pneumoniae NOT DETECTED NOT DETECTED Final   Proteus species NOT DETECTED NOT DETECTED Final   Salmonella species NOT DETECTED NOT DETECTED Final   Serratia marcescens NOT DETECTED NOT DETECTED Final   Haemophilus influenzae NOT DETECTED NOT DETECTED Final   Neisseria meningitidis NOT DETECTED NOT DETECTED Final   Pseudomonas aeruginosa DETECTED (A) NOT DETECTED Final   Stenotrophomonas maltophilia NOT DETECTED NOT DETECTED Final   Candida albicans NOT DETECTED NOT DETECTED Final   Candida auris NOT DETECTED NOT DETECTED Final   Candida glabrata NOT DETECTED NOT DETECTED Final   Candida krusei NOT DETECTED NOT DETECTED Final   Candida parapsilosis NOT DETECTED NOT DETECTED Final   Candida tropicalis NOT DETECTED NOT DETECTED Final   Cryptococcus neoformans/gattii NOT DETECTED NOT DETECTED Final   CTX-M ESBL NOT DETECTED NOT DETECTED Final   Carbapenem resistance IMP NOT DETECTED NOT DETECTED Final   Carbapenem resistance KPC NOT DETECTED NOT DETECTED Final   Carbapenem resistance NDM NOT DETECTED NOT DETECTED Final   Carbapenem resistance VIM NOT DETECTED NOT DETECTED Final    Comment: Performed at [facility], [address] [place], [place] [address]  Resp panel by RT-PCR (RSV, Flu A&B, Covid) Anterior Nasal Swab     Status: None   Collection Time: 08/13/22 11:59 AM   Specimen: Anterior Nasal Swab  Result  Value Ref Range Status   SARS Coronavirus 2 by RT PCR NEGATIVE NEGATIVE Final    Comment: (NOTE) SARS-CoV-2 target nucleic acids are NOT DETECTED.  The SARS-CoV-2 RNA is generally detectable in upper respiratory specimens during the acute phase of infection. The lowest concentration of SARS-CoV-2 viral copies this assay can detect is 138 copies/mL. A negative result does not preclude SARS-Cov-2 infection and should not be used as the sole basis for treatment or other patient management decisions. A negative result may occur with  improper specimen collection/handling, submission of specimen other than nasopharyngeal swab, presence of viral mutation(s) within the areas targeted by this assay, and inadequate number of viral copies(<138 copies/mL). A negative result must be combined with clinical observations, patient history, and epidemiological information. The expected result is Negative.  Fact Sheet for Patients:  EntrepreneurPulse.com.au  Fact Sheet for Healthcare Providers:  IncredibleEmployment.be  This test is no t yet approved or cleared by the [place] FDA and  has been authorized for detection and/or diagnosis of SARS-CoV-2 by FDA under an Emergency Use Authorization (EUA). This EUA will remain  in effect (meaning this test can be used) for the duration of the COVID-19 declaration under Section 564(b)(1) of the Act, 21 U.S.C.section 360bbb-3(b)(1), unless the authorization is terminated  or revoked sooner.       Influenza A by  PCR NEGATIVE NEGATIVE Final   Influenza B by PCR NEGATIVE NEGATIVE Final    Comment: (NOTE) The Xpert Xpress SARS-CoV-2/FLU/RSV plus assay is intended as an aid in the diagnosis of influenza from Nasopharyngeal swab specimens and should not be used as a sole basis for treatment. Nasal washings and aspirates are unacceptable for Xpert Xpress SARS-CoV-2/FLU/RSV testing.  Fact Sheet for  Patients: EntrepreneurPulse.com.au  Fact Sheet for Healthcare Providers: IncredibleEmployment.be  This test is not yet approved or cleared by the [place] FDA and has been authorized for detection and/or diagnosis of SARS-CoV-2 by FDA under an Emergency Use Authorization (EUA). This EUA will remain in effect (meaning this test can be used) for the duration of the COVID-19 declaration under Section 564(b)(1) of the Act, 21 U.S.C. section 360bbb-3(b)(1), unless the authorization is terminated or revoked.     Resp Syncytial Virus by PCR NEGATIVE NEGATIVE Final    Comment: (NOTE) Fact Sheet for Patients: EntrepreneurPulse.com.au  Fact Sheet for Healthcare Providers: IncredibleEmployment.be  This test is not yet approved or cleared by the [place] FDA and has been authorized for detection and/or diagnosis of SARS-CoV-2 by FDA under an Emergency Use Authorization (EUA). This EUA will remain in effect (meaning this test can be used) for the duration of the COVID-19 declaration under Section 564(b)(1) of the Act, 21 U.S.C. section 360bbb-3(b)(1), unless the authorization is terminated or revoked.  Performed at [facility], [place] [address], [place], [place] [address]     Antimicrobials: Anti-infectives (From admission, onward)    Start     Dose/Rate Route Frequency Ordered Stop   08/16/22 1200  vancomycin (VANCOREADY) IVPB 750 mg/150 mL        750 mg 150 mL/hr over 60 Minutes Intravenous Every 48 hours 08/14/22 1026     08/14/22 1200  ceFEPIme (MAXIPIME) 2 g in sodium chloride 0.9 % 100 mL IVPB        2 g 200 mL/hr over 30 Minutes Intravenous Every 24 hours 08/13/22 1819     08/14/22 1030  vancomycin (VANCOREADY) IVPB 750 mg/150 mL        750 mg 150 mL/hr over 60 Minutes Intravenous  Once 08/14/22 1026     08/14/22 0000  metroNIDAZOLE (FLAGYL) IVPB 500 mg        500 mg 100 mL/hr  over 60 Minutes Intravenous Every 12 hours 08/13/22 1813 08/20/22 2359   08/13/22 1230  ceFEPIme (MAXIPIME) 2 g in sodium chloride 0.9 % 100 mL IVPB        2 g 200 mL/hr over 30 Minutes Intravenous  Once 08/13/22 1225 08/13/22 1327   08/13/22 1230  metroNIDAZOLE (FLAGYL) IVPB 500 mg        500 mg 100 mL/hr over 60 Minutes Intravenous  Once 08/13/22 1225 08/13/22 1407      Culture/Microbiology    Component Value Date/Time   SDES  08/13/2022 1141    BLOOD LEFT WRIST Performed at [facility] [address] [place], [place] [address]    SPECREQUEST  08/13/2022 1141    BOTTLES DRAWN AEROBIC AND ANAEROBIC Blood Culture adequate volume Performed at [facility] [address] [place], [place] [address]    CULT PENDING 08/13/2022 1141   REPTSTATUS PENDING 08/13/2022 1141    Radiology Studies: [facility] Chest Port 1 View  Result Date: 08/14/2022 CLINICAL DATA:  Hypoxia. EXAM: PORTABLE CHEST 1 VIEW COMPARISON:  08/13/2022 FINDINGS: Stable cardiomediastinal contours. Aortic atherosclerosis. Small left pleural effusion. Significant  interval increase in asymmetric opacities within the left upper and left lower lobe. Right lung appears clear. Visualized osseous structures are unremarkable. IMPRESSION: 1. Significant interval increase in asymmetric opacities within the left upper and left lower lobe compatible with pneumonia. 2. Small left pleural effusion. Electronically Signed   By: Kerby Moors M.D.   On: 08/14/2022 10:03   US Abdomen Limited RUQ (LIVER/GB)  Result Date: 08/14/2022 CLINICAL DATA:  Elevated LFTs. EXAM: ULTRASOUND ABDOMEN LIMITED RIGHT UPPER QUADRANT COMPARISON:  None Available. FINDINGS: Gallbladder: Gallbladder not visualized. Common bile duct: Diameter: Mild intrahepatic biliary duct dilatation. Extrahepatic common bile duct measures 3 mm diameter. Liver: Heterogeneous echogenic liver parenchyma. Portal vein is patent on color Doppler imaging with  normal direction of blood flow towards the liver. Other: None. IMPRESSION: 1. Gallbladder not visualized. 2. Mild intrahepatic biliary duct dilatation. Extrahepatic common bile duct measures 3 mm diameter. 3. Heterogeneous echogenic liver parenchyma. This is a nonspecific finding but can be seen in the setting of hepatic steatosis and/or hepatocellular disease. Electronically Signed   By: Misty Stanley M.D.   On: 08/14/2022 09:15   DG Chest Port 1 View  Result Date: 08/13/2022 CLINICAL DATA:  Sepsis.  Nausea and vomiting. EXAM: PORTABLE CHEST 1 VIEW COMPARISON:  08/02/2022 FINDINGS: Stable mild cardiomegaly. Pulmonary hyperinflation again demonstrated, consistent with COPD. Patient is rotated to the left. Asymmetric patchy opacity is seen in the central left upper lobe, suspicious for pneumonia. Blunting of the left costophrenic angle may be due to tiny left pleural effusion. IMPRESSION: Asymmetric patchy opacity in central left upper lobe, suspicious for pneumonia. Cardiomegaly and COPD. Electronically Signed   By: Marlaine Hind M.D.   On: 08/13/2022 13:14   CT Lumbar Spine Wo Contrast  Result Date: 08/13/2022 CLINICAL DATA:  88 year old female with back pain, weakness, vomiting. Recent ERCP. EXAM: CT LUMBAR SPINE WITHOUT CONTRAST TECHNIQUE: Multidetector CT imaging of the lumbar spine was performed without intravenous contrast administration. Multiplanar CT image reconstructions were also generated. RADIATION DOSE REDUCTION: This exam was performed according to the departmental dose-optimization program which includes automated exposure control, adjustment of the mA and/or kV according to patient size and/or use of iterative reconstruction technique. COMPARISON:  CT Abdomen and Pelvis 08/02/2022. FINDINGS: Segmentation: Normal. Alignment: Stable. Mild S-shaped thoracolumbar scoliosis, relatively preserved lumbar lordosis. Vertebrae: Diffuse osteopenia. Mild to moderate T12 inferior endplate fracture  appears unchanged. No acute osseous abnormality identified. Visible sacrum and SI joints appear intact. Paraspinal and other soft tissues: Infrarenal abdominal aortic aneurysm again partially visible, diameter grossly stable at 31 mm. Underlying Aortoiliac calcified atherosclerosis. Vascular patency is not evaluated in the absence of IV contrast. A biliary stent is visible at the liver hilum and duodenum. Other visible noncontrast abdominal viscera appear negative. Partially visible small layering pleural effusions and lung base atelectasis. Lumbar paraspinal soft tissues within normal limits. Disc levels: Diffuse advanced lumbar disc degeneration. Vacuum disc throughout. Associated multilevel multifactorial lumbar spinal stenosis appears stable from the recent CT this month, mild at L2-L3, moderate to severe at L3-L4, severe at L4-L5. IMPRESSION: 1. Diffuse osteopenia. Chronic T12 inferior endplate compression fracture. No acute osseous abnormality in the Lumbar Spine. 2. Advanced lumbar disc degeneration with multilevel spinal stenosis, stable since 08/02/2022 and moderate or severe at L3-L4 and L4-L5. 3. Partially visible grossly stable infrarenal abdominal aortic aneurysm, 3.1 cm. Recommend follow-up every 3 years. Reference: J Am Coll Radiol [id number]. Aortic aneurysm NOS (ICD10-I71.9). Aortic Atherosclerosis (ICD10-I70.0). 4. Biliary stent. Small layering pleural effusions and lung base  atelectasis. Electronically Signed   By: Genevie Ann M.D.   On: 08/13/2022 09:38     LOS: 1 day   Antonieta Pert, MD [facility]  08/14/2022, 10:45 AM

## 2022-08-14 NOTE — ED Notes (Signed)
Checked back with inpt RN to see if they are ready for transport

## 2022-08-14 NOTE — Progress Notes (Signed)
Chaplain provided prayer over "Jasmin Crane" with family present.  They shared she was moving to [address] [place] offered support.     08/14/22 1500  Clinical Encounter Type  Visited With Patient and family together  Visit Type Initial;Spiritual support  Spiritual Encounters  Spiritual Needs Prayer

## 2022-08-14 NOTE — ED Notes (Signed)
Specimen cup given to pt for sputum

## 2022-08-14 NOTE — Evaluation (Signed)
Clinical/Bedside Swallow Evaluation Patient Details  Name: Jasmin Crane MRN: [id number] Date of Birth: 1931/09/27  Today's Date: 08/14/2022 Time: SLP Start Time (ACUTE ONLY): 8127 SLP Stop Time (ACUTE ONLY): 0945 SLP Time Calculation (min) (ACUTE ONLY): 20 min  Past Medical History:  Past Medical History:  Diagnosis Date   Arrhythmia    Cancer ([place])    skin cancer 2005   Carotid artery occlusion    Claudication of lower extremity ([facility])    Complication of anesthesia    one bad experience 30-40  yrs ago, since had procedures went okay   Coronary artery disease    Diverticulitis    Hyperlipidemia    Hypertension    Hypothyroidism    Myopathy    Paralysis of tongue    Sick sinus syndrome [facility])    Past Surgical History:  Past Surgical History:  Procedure Laterality Date   APPENDECTOMY     BILIARY BRUSHING  07/21/2022   Procedure: BILIARY BRUSHING;  Surgeon: Carol Ada, MD;  Location: [facility];  Service: Gastroenterology;;   BILIARY BRUSHING  08/04/2022   Procedure: BILIARY BRUSHING;  Surgeon: Carol Ada, MD;  Location: [facility];  Service: Gastroenterology;;   BILIARY STENT PLACEMENT [name] 07/21/2022   Procedure: BILIARY STENT PLACEMENT;  Surgeon: Carol Ada, MD;  Location: [facility];  Service: Gastroenterology;  Laterality: [name];   BILIARY STENT PLACEMENT [name] 08/04/2022   Procedure: BILIARY STENT PLACEMENT;  Surgeon: Carol Ada, MD;  Location: [facility];  Service: Gastroenterology;  Laterality: [name];   CAROTID ENDARTERECTOMY     CATARACT EXTRACTION, BILATERAL     CESAREAN SECTION     COLONOSCOPY     CORONARY ANGIOPLASTY     CORONARY STENT PLACEMENT     right coronary artery 1998   ENDOSCOPIC RETROGRADE CHOLANGIOPANCREATOGRAPHY (ERCP) WITH PROPOFOL [name] 07/21/2022   Procedure: ENDOSCOPIC RETROGRADE CHOLANGIOPANCREATOGRAPHY (ERCP) WITH PROPOFOL;  Surgeon: Carol Ada, MD;  Location: [facility];  Service: Gastroenterology;  Laterality: [name];   ERCP [name]  08/04/2022   Procedure: ENDOSCOPIC RETROGRADE CHOLANGIOPANCREATOGRAPHY (ERCP);  Surgeon: Carol Ada, MD;  Location: [facility];  Service: Gastroenterology;  Laterality: [name];   ESOPHAGOGASTRODUODENOSCOPY (EGD) WITH PROPOFOL [name] 07/21/2022   Procedure: ESOPHAGOGASTRODUODENOSCOPY (EGD) WITH PROPOFOL;  Surgeon: Carol Ada, MD;  Location: [facility];  Service: Gastroenterology;  Laterality: [name];   EUS [name] 07/21/2022   Procedure: UPPER ENDOSCOPIC ULTRASOUND (EUS) LINEAR;  Surgeon: Carol Ada, MD;  Location: [facility];  Service: Gastroenterology;  Laterality: [name];   INGUINAL HERNIA REPAIR Right 02/25/2019   Procedure: RIGHT INGUINAL HERNIA REPAIR WITH MESH;  Surgeon: Donnie Mesa, MD;  Location: [facility];  Service: General;  Laterality: Right;  LMA AND TAP BLOCK   MULTIPLE TOOTH EXTRACTIONS     SALIVARY GLAND SURGERY     removed 1998   SALPINGECTOMY     SKIN CANCER EXCISION     2005   SPHINCTEROTOMY  07/21/2022   Procedure: SPHINCTEROTOMY;  Surgeon: Carol Ada, MD;  Location: [facility];  Service: Gastroenterology;;   Lavell Islam REMOVAL  08/04/2022   Procedure: STENT REMOVAL;  Surgeon: Carol Ada, MD;  Location: [facility];  Service: Gastroenterology;;   HPI:  Patient is an 88 y.o. female with PMH: sick sinus syndrome, carotid artery occlusion, fracture of the humerus, unspecified myopathy, peripheral vascular disease with claudication, CAD, cardiomegaly, chronic diastolic CHF, aortic valve regurgitation, hypertension, infrarenal AAA, hyperlipidemia, abnormal LFTs, Alzheimer's disease, hypothyroidism, emphysema, history of pneumonia, chronic constipation who was recently admitted and discharged from 08/02/2022 to 08/05/2022 due to sepsis due  to Klebsiella and E. coli, biliary obstruction in the setting of cholangiocarcinoma undergoing an ERCP with biliary stent placement and stricture brushing for pathology by Dr. Carol Ada. She presented back to the hospital on 08/13/22 from ALF  with bilateral lower back pain, generalized weakness and confusion. In ED, she was afebrile, SpO2 99% on RA but had sudden drop to 85% and required NRB. She was ultimately transitioned to [facility]. She failed [facility] with nursing on 12/17, prompting NPO order and SLP swallow evaluation order.    Assessment / Plan / Recommendation  Clinical Impression  Patient is presenting with clinical s/s of dysphagia as per this bedside swallow evaluation. Patient was adequately awake, alert and agreeable to testing but she also appeared very weak and fatigued, c/o headache. She consumed thin liquids (via cup) sips (did not want to use straw) and purees (applesauce) without overt s/s aspiration or penetration and with timely swallow initiation. She exhibited mild oral phase delay likely due to weakness. When SLP left room to retrieve something for patient's dry lips, daughter came out of room and informed SLP that patient had just coughed up some blood. Upon inspection and talking with family, appears that this was blood mixed with phlegm/mucous; color was bright red and consistency was thick and sticky. SLP is recommending to initiate full liquids diet at this time and plan to follow patient for toleration and ability to advance with solids. SLP Visit Diagnosis: Dysphagia, unspecified (R13.10)    Aspiration Risk  Mild aspiration risk    Diet Recommendation Thin liquid;Other (Comment) (full liquids)   Liquid Administration via: Cup;Straw Medication Administration: Whole meds with puree Supervision: Patient able to self feed;Full supervision/cueing for compensatory strategies Compensations: Small sips/bites;Slow rate Postural Changes: Seated upright at 90 degrees    Other  Recommendations Oral Care Recommendations: Oral care BID;Staff/trained caregiver to provide oral care    Recommendations for follow up therapy are one component of a multi-disciplinary discharge planning process, led by the attending physician.   Recommendations may be updated based on patient status, additional functional criteria and insurance authorization.  Follow up Recommendations Follow physician's recommendations for discharge plan and follow up therapies      Assistance Recommended at Discharge    Functional Status Assessment Patient has had a recent decline in their functional status and demonstrates the ability to make significant improvements in function in a reasonable and predictable amount of time.  Frequency and Duration min 1 x/week  1 week       Prognosis Prognosis for Safe Diet Advancement: Good      Swallow Study   General Date of Onset: 08/13/22 HPI: Patient is an 88 y.o. female with PMH: sick sinus syndrome, carotid artery occlusion, fracture of the humerus, unspecified myopathy, peripheral vascular disease with claudication, CAD, cardiomegaly, chronic diastolic CHF, aortic valve regurgitation, hypertension, infrarenal AAA, hyperlipidemia, abnormal LFTs, Alzheimer's disease, hypothyroidism, emphysema, history of pneumonia, chronic constipation who was recently admitted and discharged from 08/02/2022 to 08/05/2022 due to sepsis due to Klebsiella and E. coli, biliary obstruction in the setting of cholangiocarcinoma undergoing an ERCP with biliary stent placement and stricture brushing for pathology by Dr. Carol Ada. She presented back to the hospital on 08/13/22 from ALF with bilateral lower back pain, generalized weakness and confusion. In ED, she was afebrile, SpO2 99% on RA but had sudden drop to 85% and required NRB. She was ultimately transitioned to [facility]. She failed [facility] with nursing on 12/17, prompting NPO order and SLP swallow evaluation order.  Type of Study: Bedside Swallow Evaluation Previous Swallow Assessment: BSE in 2022 Diet Prior to this Study: NPO Temperature Spikes Noted: No Respiratory Status: Nasal cannula History of Recent Intubation: No Behavior/Cognition: Alert;Cooperative;Pleasant  mood;Lethargic/Drowsy Oral Cavity Assessment: Dry Oral Care Completed by SLP: Yes Oral Cavity - Dentition: Dentures, top;Other (Comment) (unable to have bottom dentures fit until January per daughter) Vision: Functional for self-feeding Self-Feeding Abilities: Needs assist;Needs set up Patient Positioning: Upright in bed Baseline Vocal Quality: Low vocal intensity Volitional Cough: Weak Volitional Swallow: Able to elicit    Oral/Motor/Sensory Function Overall Oral Motor/Sensory Function: Within functional limits   Ice Chips     Thin Liquid Thin Liquid: Within functional limits Presentation: Straw    Nectar Thick     Honey Thick     Puree Puree: Impaired Oral Phase Functional Implications: Prolonged oral transit   Solid     Solid: Not tested     Sonia Baller, MA, CCC-SLP Speech Therapy

## 2022-08-14 NOTE — ED Notes (Signed)
ED TO INPATIENT HANDOFF REPORT  ED Nurse Name and Phone #: Yenesis Even  S Name/Age/Gender Jasmin Crane 88 y.o. female Room/Bed: WA16/WA16  Code Status   Code Status: DNR  Home/SNF/Other Nursing Home or AL but Hospice to help as well Patient oriented to: Is this baseline? Yes   Triage Complete: Triage complete  Chief Complaint Sepsis due to undetermined organism [facility]) [A41.9] Acute respiratory failure with hypoxia ([place]) [J96.01]  Triage Note Per EMS, Pt, from [facility], presents w/ weakness, bilateral low back pain, and n/v.  Pt has a skin tear to R elbow as well.  EMS reports they were initially called out for a "lift assist from the restroom," but Pt was found sitting naked on her bed.  '4mg'$  Zofran given en route.    Allergies Allergies  Allergen Reactions   Other Anaphylaxis and Swelling    Scallops   Shellfish Allergy Anaphylaxis and Swelling   Cozaar [Losartan] Nausea Only   Pletaal [Cilostazol] Palpitations    Level of Care/Admitting Diagnosis ED Disposition     ED Disposition  Admit   Condition  --   Comment  Hospital Area: [place] [[id number]]  Level of Care: Stepdown [14]  Admit to SDU based on following criteria: Respiratory Distress:  Frequent assessment and/or intervention to maintain adequate ventilation/respiration, pulmonary toilet, and respiratory treatment.  May admit patient to [facility] or [facility] if equivalent level of care is available:: No  Covid Evaluation: Symptomatic Person Under Investigation (PUI) or recent exposure (last 10 days) *Testing Required*  Diagnosis: Acute respiratory failure with hypoxia [facility]) [[id number]]  Admitting Physician: Antonieta Pert [[id number]]  Attending Physician: Antonieta Pert [[id number]]  Certification:: I certify this patient will need inpatient services for at least 2 midnights          B Medical/Surgery History Past Medical History:  Diagnosis Date   Arrhythmia    Cancer ([place])    skin  cancer 2005   Carotid artery occlusion    Claudication of lower extremity ([place])    Complication of anesthesia    one bad experience 30-40  yrs ago, since had procedures went okay   Coronary artery disease    Diverticulitis    Hyperlipidemia    Hypertension    Hypothyroidism    Myopathy    Paralysis of tongue    Sick sinus syndrome [facility])    Past Surgical History:  Procedure Laterality Date   APPENDECTOMY     BILIARY BRUSHING  07/21/2022   Procedure: BILIARY BRUSHING;  Surgeon: Carol Ada, MD;  Location: [facility];  Service: Gastroenterology;;   BILIARY BRUSHING  08/04/2022   Procedure: BILIARY BRUSHING;  Surgeon: Carol Ada, MD;  Location: [facility];  Service: Gastroenterology;;   BILIARY STENT PLACEMENT [name] 07/21/2022   Procedure: BILIARY STENT PLACEMENT;  Surgeon: Carol Ada, MD;  [facility];  Service: Gastroenterology;  Laterality: [name];   BILIARY STENT PLACEMENT [name] 08/04/2022   Procedure: BILIARY STENT PLACEMENT;  Surgeon: Carol Ada, MD;  [facility];  Service: Gastroenterology;  Laterality: [name];   CAROTID ENDARTERECTOMY     CATARACT EXTRACTION, BILATERAL     CESAREAN SECTION     COLONOSCOPY     CORONARY ANGIOPLASTY     CORONARY STENT PLACEMENT     right coronary artery 1998   ENDOSCOPIC RETROGRADE CHOLANGIOPANCREATOGRAPHY (ERCP) WITH PROPOFOL [name] 07/21/2022   Procedure: ENDOSCOPIC RETROGRADE CHOLANGIOPANCREATOGRAPHY (ERCP) WITH PROPOFOL;  Surgeon: Carol Ada, MD;  [facility];  Service: Gastroenterology;  Laterality: [name];  ERCP [name] 08/04/2022   Procedure: ENDOSCOPIC RETROGRADE CHOLANGIOPANCREATOGRAPHY (ERCP);  Surgeon: Carol Ada, MD;  Location: [facility];  Service: Gastroenterology;  Laterality: [name];   ESOPHAGOGASTRODUODENOSCOPY (EGD) WITH PROPOFOL [name] 07/21/2022   Procedure: ESOPHAGOGASTRODUODENOSCOPY (EGD) WITH PROPOFOL;  Surgeon: Carol Ada, MD;  [facility];  Service: Gastroenterology;  Laterality:  [name];   EUS [name] 07/21/2022   Procedure: UPPER ENDOSCOPIC ULTRASOUND (EUS) LINEAR;  Surgeon: Carol Ada, MD;  [facility];  Service: Gastroenterology;  Laterality: [name];   INGUINAL HERNIA REPAIR Right 02/25/2019   Procedure: RIGHT INGUINAL HERNIA REPAIR WITH MESH;  Surgeon: Donnie Mesa, MD;  Location: [place];  Service: General;  Laterality: Right;  LMA AND TAP BLOCK   MULTIPLE TOOTH EXTRACTIONS     SALIVARY GLAND SURGERY     removed 1998   SALPINGECTOMY     SKIN CANCER EXCISION     2005   SPHINCTEROTOMY  07/21/2022   Procedure: SPHINCTEROTOMY;  Surgeon: Carol Ada, MD;  Location: [facility];  Service: Gastroenterology;;   Jasmin Crane REMOVAL  08/04/2022   Procedure: STENT REMOVAL;  Surgeon: Carol Ada, MD;  Location: [facility];  Service: Gastroenterology;;     A IV Location/Drains/Wounds Patient Lines/Drains/Airways Status     Active Line/Drains/Airways     Name Placement date Placement time Site Days   Peripheral IV 08/13/22 20 G Left Antecubital 08/13/22  0834  Antecubital  1   Peripheral IV 08/13/22 20 G Anterior;Left Forearm 08/13/22  1148  Forearm  1   External Urinary Catheter 08/14/22  0230  --  less than 1   GI Stent 07/21/22  0915  --  24   GI Stent 08/04/22  0842  --  10   Incision (Closed) 02/25/19 Groin Right 02/25/19  0758  -- 1266            Intake/Output Last 24 hours  Intake/Output Summary (Last 24 hours) at 08/14/2022 1529 Last data filed at 08/14/2022 1518 Gross per 24 hour  Intake 309.05 ml  Output --  Net 309.05 ml    Labs/Imaging Results for orders placed or performed during the hospital encounter of 08/13/22 (from the past 48 hour(s))  CBC with Differential     Status: Abnormal   Collection Time: 08/13/22  9:15 AM  Result Value Ref Range   WBC 17.8 (H) 4.0 - 10.5 K/uL   RBC 3.89 3.87 - 5.11 MIL/uL   Hemoglobin 10.4 (L) 12.0 - 15.0 g/dL   HCT 34.3 (L) 36.0 - 46.0 %   MCV 88.2 80.0 - 100.0 fL   MCH 26.7 26.0 - 34.0 pg    MCHC 30.3 30.0 - 36.0 g/dL   RDW 18.6 (H) 11.5 - 15.5 %   Platelets 157 150 - 400 K/uL   nRBC 0.0 0.0 - 0.2 %   Neutrophils Relative % 93 %   Neutro Abs 16.6 (H) 1.7 - 7.7 K/uL   Lymphocytes Relative 2 %   Lymphs Abs 0.3 (L) 0.7 - 4.0 K/uL   Monocytes Relative 4 %   Monocytes Absolute 0.7 0.1 - 1.0 K/uL   Eosinophils Relative 0 %   Eosinophils Absolute 0.0 0.0 - 0.5 K/uL   Basophils Relative 0 %   Basophils Absolute 0.1 0.0 - 0.1 K/uL   Immature Granulocytes 1 %   Abs Immature Granulocytes 0.11 (H) 0.00 - 0.07 K/uL    Comment: Performed at [facility], [place] [address], [place], [place] [address]  Comprehensive metabolic panel     Status:  Abnormal   Collection Time: 08/13/22  9:15 AM  Result Value Ref Range   Sodium 136 135 - 145 mmol/L   Potassium 4.0 3.5 - 5.1 mmol/L   Chloride 104 98 - 111 mmol/L   CO2 21 (L) 22 - 32 mmol/L   Glucose, Bld 143 (H) 70 - 99 mg/dL    Comment: Glucose reference range applies only to samples taken after fasting for at least 8 hours.   BUN 24 (H) 8 - 23 mg/dL   Creatinine, Ser 1.04 (H) 0.44 - 1.00 mg/dL   Calcium 9.2 8.9 - 10.3 mg/dL   Total Protein 6.8 6.5 - 8.1 g/dL   Albumin 3.0 (L) 3.5 - 5.0 g/dL   AST 138 (H) 15 - 41 U/L   ALT 77 (H) 0 - 44 U/L   Alkaline Phosphatase 486 (H) 38 - 126 U/L   Total Bilirubin 1.2 0.3 - 1.2 mg/dL   GFR, Estimated 52 (L) >60 mL/min    Comment: (NOTE) Calculated using the CKD-EPI Creatinine Equation (2021)    Anion gap 11 5 - 15    Comment: Performed at [facility], [place] [address] [place], [place] [address]  Lipase, blood     Status: None   Collection Time: 08/13/22  9:15 AM  Result Value Ref Range   Lipase 35 11 - 51 U/L    Comment: Performed at [facility], [place] [address], [place], [place] [address]  Troponin I (High Sensitivity)     Status: Abnormal   Collection Time: 08/13/22  9:15 AM  Result Value Ref Range   Troponin I (High Sensitivity) 116  (HH) <18 ng/L    Comment: CRITICAL RESULT CALLED TO, READ BACK BY AND VERIFIED WITH CHENEY, A RN @ 1506 ON 08/13/2022 BY XIONG, K (NOTE) Elevated high sensitivity troponin I (hsTnI) values and significant  changes across serial measurements may suggest ACS but many other  chronic and acute conditions are known to elevate hsTnI results.  Refer to the "Links" section for chest pain algorithms and additional  guidance. Performed at [facility], [place] [address], [place], [place] [address]   Lactic acid, plasma     Status: Abnormal   Collection Time: 08/13/22 11:41 AM  Result Value Ref Range   Lactic Acid, Venous 3.7 (HH) 0.5 - 1.9 mmol/L    Comment: CRITICAL RESULT CALLED TO, READ BACK BY AND VERIFIED WITH DOSS, M RN @ 1220 ON 08/13/2022 BY Gloriann Loan Performed at [facility], [place] [address] [place], [place] [address]   Blood culture (routine x 2)     Status: None (Preliminary result)   Collection Time: 08/13/22 11:41 AM   Specimen: BLOOD LEFT WRIST  Result Value Ref Range   Specimen Description      BLOOD LEFT WRIST Performed at [place] [address], [place], [place] [address]    Special Requests      BOTTLES DRAWN AEROBIC AND ANAEROBIC Blood Culture adequate volume Performed at [place] [address], [place], [place] [address]    Culture  Setup Time      GRAM NEGATIVE RODS AEROBIC BOTTLE ONLY Organism ID to follow CRITICAL RESULT CALLED TO, READ BACK BY AND VERIFIED WITH: PHARMD MICHELLE BELL ON 12.18.23 AT 1054 BY EM Performed at [facility], [place] [address] [place], [place] [address]    Culture PENDING    Report Status PENDING   Blood Culture ID  Panel (Reflexed)     Status: Abnormal   Collection Time: 08/13/22 11:41 AM  Result Value Ref Range   Enterococcus faecalis NOT DETECTED NOT DETECTED   Enterococcus Faecium NOT DETECTED NOT DETECTED   Listeria monocytogenes NOT DETECTED NOT  DETECTED   Staphylococcus species NOT DETECTED NOT DETECTED   Staphylococcus aureus (BCID) NOT DETECTED NOT DETECTED   Staphylococcus epidermidis NOT DETECTED NOT DETECTED   Staphylococcus lugdunensis NOT DETECTED NOT DETECTED   Streptococcus species NOT DETECTED NOT DETECTED   Streptococcus agalactiae NOT DETECTED NOT DETECTED   Streptococcus pneumoniae NOT DETECTED NOT DETECTED   Streptococcus pyogenes NOT DETECTED NOT DETECTED   A.calcoaceticus-baumannii NOT DETECTED NOT DETECTED   Bacteroides fragilis NOT DETECTED NOT DETECTED   Enterobacterales NOT DETECTED NOT DETECTED   Enterobacter cloacae complex NOT DETECTED NOT DETECTED   Escherichia coli NOT DETECTED NOT DETECTED   Klebsiella aerogenes NOT DETECTED NOT DETECTED   Klebsiella oxytoca NOT DETECTED NOT DETECTED   Klebsiella pneumoniae NOT DETECTED NOT DETECTED   Proteus species NOT DETECTED NOT DETECTED   Salmonella species NOT DETECTED NOT DETECTED   Serratia marcescens NOT DETECTED NOT DETECTED   Haemophilus influenzae NOT DETECTED NOT DETECTED   Neisseria meningitidis NOT DETECTED NOT DETECTED   Pseudomonas aeruginosa DETECTED (A) NOT DETECTED    Comment: CRITICAL RESULT CALLED TO, READ BACK BY AND VERIFIED WITH: M. BELL PHARMD, AT 1054 08/14/22 BY E. MILLER    Stenotrophomonas maltophilia NOT DETECTED NOT DETECTED   Candida albicans NOT DETECTED NOT DETECTED   Candida auris NOT DETECTED NOT DETECTED   Candida glabrata NOT DETECTED NOT DETECTED   Candida krusei NOT DETECTED NOT DETECTED   Candida parapsilosis NOT DETECTED NOT DETECTED   Candida tropicalis NOT DETECTED NOT DETECTED   Cryptococcus neoformans/gattii NOT DETECTED NOT DETECTED   CTX-M ESBL NOT DETECTED NOT DETECTED   Carbapenem resistance IMP NOT DETECTED NOT DETECTED   Carbapenem resistance KPC NOT DETECTED NOT DETECTED   Carbapenem resistance NDM NOT DETECTED NOT DETECTED   Carbapenem resistance VIM NOT DETECTED NOT DETECTED    Comment: Performed at  [facility], [address] [place], [place] [address]  Resp panel by RT-PCR (RSV, Flu A&B, Covid) Anterior Nasal Swab     Status: None   Collection Time: 08/13/22 11:59 AM   Specimen: Anterior Nasal Swab  Result Value Ref Range   SARS Coronavirus 2 by RT PCR NEGATIVE NEGATIVE    Comment: (NOTE) SARS-CoV-2 target nucleic acids are NOT DETECTED.  The SARS-CoV-2 RNA is generally detectable in upper respiratory specimens during the acute phase of infection. The lowest concentration of SARS-CoV-2 viral copies this assay can detect is 138 copies/mL. A negative result does not preclude SARS-Cov-2 infection and should not be used as the sole basis for treatment or other patient management decisions. A negative result may occur with  improper specimen collection/handling, submission of specimen other than nasopharyngeal swab, presence of viral mutation(s) within the areas targeted by this assay, and inadequate number of viral copies(<138 copies/mL). A negative result must be combined with clinical observations, patient history, and epidemiological information. The expected result is Negative.  Fact Sheet for Patients:  EntrepreneurPulse.com.au  Fact Sheet for Healthcare Providers:  IncredibleEmployment.be  This test is no t yet approved or cleared by the [place] FDA and  has been authorized for detection and/or diagnosis of SARS-CoV-2 by FDA under an Emergency Use Authorization (EUA). This EUA will remain  in effect (meaning this test can be used)  for the duration of the COVID-19 declaration under Section 564(b)(1) of the Act, 21 U.S.C.section 360bbb-3(b)(1), unless the authorization is terminated  or revoked sooner.       Influenza A by PCR NEGATIVE NEGATIVE   Influenza B by PCR NEGATIVE NEGATIVE    Comment: (NOTE) The Xpert Xpress SARS-CoV-2/FLU/RSV plus assay is intended as an aid in the diagnosis of influenza from Nasopharyngeal  swab specimens and should not be used as a sole basis for treatment. Nasal washings and aspirates are unacceptable for Xpert Xpress SARS-CoV-2/FLU/RSV testing.  Fact Sheet for Patients: EntrepreneurPulse.com.au  Fact Sheet for Healthcare Providers: IncredibleEmployment.be  This test is not yet approved or cleared by the [place] FDA and has been authorized for detection and/or diagnosis of SARS-CoV-2 by FDA under an Emergency Use Authorization (EUA). This EUA will remain in effect (meaning this test can be used) for the duration of the COVID-19 declaration under Section 564(b)(1) of the Act, 21 U.S.C. section 360bbb-3(b)(1), unless the authorization is terminated or revoked.     Resp Syncytial Virus by PCR NEGATIVE NEGATIVE    Comment: (NOTE) Fact Sheet for Patients: EntrepreneurPulse.com.au  Fact Sheet for Healthcare Providers: IncredibleEmployment.be  This test is not yet approved or cleared by the [place] FDA and has been authorized for detection and/or diagnosis of SARS-CoV-2 by FDA under an Emergency Use Authorization (EUA). This EUA will remain in effect (meaning this test can be used) for the duration of the COVID-19 declaration under Section 564(b)(1) of the Act, 21 U.S.C. section 360bbb-3(b)(1), unless the authorization is terminated or revoked.  Performed at [facility], [place] [address] [place], [place] [address]   Lactic acid, plasma     Status: Abnormal   Collection Time: 08/13/22  1:41 PM  Result Value Ref Range   Lactic Acid, Venous 3.3 (HH) 0.5 - 1.9 mmol/L    Comment: CRITICAL RESULT CALLED TO, READ BACK BY AND VERIFIED WITH CHENEY, A RN @ 9371 ON 08/13/2022 BY Gloriann Loan Performed at [facility], [place] [address], [place], [place] [address]   Troponin I (High Sensitivity)     Status: Abnormal   Collection Time: 08/13/22  2:03 PM  Result  Value Ref Range   Troponin I (High Sensitivity) 143 (HH) <18 ng/L    Comment: CRITICAL RESULT CALLED TO, READ BACK BY AND VERIFIED WITH DOSS, M RN '@1530'$  N 08/13/2022 BY Jason Fila, K (NOTE) Elevated high sensitivity troponin I (hsTnI) values and significant  changes across serial measurements may suggest ACS but many other  chronic and acute conditions are known to elevate hsTnI results.  Refer to the "Links" section for chest pain algorithms and additional  guidance. Performed at [facility], [place] [address], [place], [place] [address]   Lactic acid, plasma     Status: Abnormal   Collection Time: 08/13/22  6:31 PM  Result Value Ref Range   Lactic Acid, Venous 2.2 (HH) 0.5 - 1.9 mmol/L    Comment: CRITICAL VALUE NOTED. VALUE IS CONSISTENT WITH PREVIOUSLY REPORTED/CALLED VALUE Performed at [place] [address], [place], [place] [address]   Troponin I (High Sensitivity)     Status: Abnormal   Collection Time: 08/13/22  6:37 PM  Result Value Ref Range   Troponin I (High Sensitivity) 207 (HH) <18 ng/L    Comment: DELTA CHECK NOTED CRITICAL VALUE NOTED. VALUE IS CONSISTENT WITH PREVIOUSLY REPORTED/CALLED VALUE [place],K EMTP AT 1932 ON 08/13/22 BY LUZOLOP (NOTE) Elevated high sensitivity  troponin I (hsTnI) values and significant  changes across serial measurements may suggest ACS but many other  chronic and acute conditions are known to elevate hsTnI results.  Refer to the "Links" section for chest pain algorithms and additional  guidance. Performed at [facility], [place] [address] [place], [place] [address]   CBC     Status: Abnormal   Collection Time: 08/14/22  4:19 AM  Result Value Ref Range   WBC 13.1 (H) 4.0 - 10.5 K/uL   RBC 3.16 (L) 3.87 - 5.11 MIL/uL   Hemoglobin 8.6 (L) 12.0 - 15.0 g/dL   HCT 28.1 (L) 36.0 - 46.0 %   MCV 88.9 80.0 - 100.0 fL   MCH 27.2 26.0 - 34.0 pg   MCHC 30.6 30.0 - 36.0 g/dL   RDW  18.8 (H) 11.5 - 15.5 %   Platelets 120 (L) 150 - 400 K/uL   nRBC 0.0 0.0 - 0.2 %    Comment: Performed at [facility], [place] [address], [place], [place] [address]  Comprehensive metabolic panel     Status: Abnormal   Collection Time: 08/14/22  4:19 AM  Result Value Ref Range   Sodium 136 135 - 145 mmol/L   Potassium 3.9 3.5 - 5.1 mmol/L   Chloride 104 98 - 111 mmol/L   CO2 25 22 - 32 mmol/L   Glucose, Bld 111 (H) 70 - 99 mg/dL    Comment: Glucose reference range applies only to samples taken after fasting for at least 8 hours.   BUN 26 (H) 8 - 23 mg/dL   Creatinine, Ser 1.05 (H) 0.44 - 1.00 mg/dL   Calcium 8.2 (L) 8.9 - 10.3 mg/dL   Total Protein 5.6 (L) 6.5 - 8.1 g/dL   Albumin 2.4 (L) 3.5 - 5.0 g/dL   AST 136 (H) 15 - 41 U/L   ALT 74 (H) 0 - 44 U/L   Alkaline Phosphatase 317 (H) 38 - 126 U/L   Total Bilirubin 2.9 (H) 0.3 - 1.2 mg/dL   GFR, Estimated 51 (L) >60 mL/min    Comment: (NOTE) Calculated using the CKD-EPI Creatinine Equation (2021)    Anion gap 7 5 - 15    Comment: Performed at [facility], [place] [address], [place], [place] [address]  Protime-INR     Status: Abnormal   Collection Time: 08/14/22  4:19 AM  Result Value Ref Range   Prothrombin Time 19.2 (H) 11.4 - 15.2 seconds   INR 1.6 (H) 0.8 - 1.2    Comment: (NOTE) INR goal varies based on device and disease states. Performed at [facility], [place] [address] [place], [place] [address]   Brain natriuretic peptide     Status: Abnormal   Collection Time: 08/14/22  4:19 AM  Result Value Ref Range   B Natriuretic Peptide 2,131.0 (H) 0.0 - 100.0 pg/mL    Comment: Performed at [facility], [place] [address], [place], [place] [address]   DG Chest Port 1 View  Result Date: 08/14/2022 CLINICAL DATA:  Hypoxia. EXAM: PORTABLE CHEST 1 VIEW COMPARISON:  08/13/2022 FINDINGS: Stable cardiomediastinal contours. Aortic atherosclerosis. Small left  pleural effusion. Significant interval increase in asymmetric opacities within the left upper and left lower lobe. Right lung appears clear. Visualized osseous structures are unremarkable. IMPRESSION: 1. Significant interval increase in asymmetric opacities within the left upper and left lower lobe compatible with pneumonia. 2. Small left pleural effusion. Electronically Signed   By:  Kerby Moors M.D.   On: 08/14/2022 10:03   US Abdomen Limited RUQ (LIVER/GB)  Result Date: 08/14/2022 CLINICAL DATA:  Elevated LFTs. EXAM: ULTRASOUND ABDOMEN LIMITED RIGHT UPPER QUADRANT COMPARISON:  None Available. FINDINGS: Gallbladder: Gallbladder not visualized. Common bile duct: Diameter: Mild intrahepatic biliary duct dilatation. Extrahepatic common bile duct measures 3 mm diameter. Liver: Heterogeneous echogenic liver parenchyma. Portal vein is patent on color Doppler imaging with normal direction of blood flow towards the liver. Other: None. IMPRESSION: 1. Gallbladder not visualized. 2. Mild intrahepatic biliary duct dilatation. Extrahepatic common bile duct measures 3 mm diameter. 3. Heterogeneous echogenic liver parenchyma. This is a nonspecific finding but can be seen in the setting of hepatic steatosis and/or hepatocellular disease. Electronically Signed   By: Misty Stanley M.D.   On: 08/14/2022 09:15   DG Chest Port 1 View  Result Date: 08/13/2022 CLINICAL DATA:  Sepsis.  Nausea and vomiting. EXAM: PORTABLE CHEST 1 VIEW COMPARISON:  08/02/2022 FINDINGS: Stable mild cardiomegaly. Pulmonary hyperinflation again demonstrated, consistent with COPD. Patient is rotated to the left. Asymmetric patchy opacity is seen in the central left upper lobe, suspicious for pneumonia. Blunting of the left costophrenic angle may be due to tiny left pleural effusion. IMPRESSION: Asymmetric patchy opacity in central left upper lobe, suspicious for pneumonia. Cardiomegaly and COPD. Electronically Signed   By: Marlaine Hind M.D.   On:  08/13/2022 13:14   CT Lumbar Spine Wo Contrast  Result Date: 08/13/2022 CLINICAL DATA:  88 year old female with back pain, weakness, vomiting. Recent ERCP. EXAM: CT LUMBAR SPINE WITHOUT CONTRAST TECHNIQUE: Multidetector CT imaging of the lumbar spine was performed without intravenous contrast administration. Multiplanar CT image reconstructions were also generated. RADIATION DOSE REDUCTION: This exam was performed according to the departmental dose-optimization program which includes automated exposure control, adjustment of the mA and/or kV according to patient size and/or use of iterative reconstruction technique. COMPARISON:  CT Abdomen and Pelvis 08/02/2022. FINDINGS: Segmentation: Normal. Alignment: Stable. Mild S-shaped thoracolumbar scoliosis, relatively preserved lumbar lordosis. Vertebrae: Diffuse osteopenia. Mild to moderate T12 inferior endplate fracture appears unchanged. No acute osseous abnormality identified. Visible sacrum and SI joints appear intact. Paraspinal and other soft tissues: Infrarenal abdominal aortic aneurysm again partially visible, diameter grossly stable at 31 mm. Underlying Aortoiliac calcified atherosclerosis. Vascular patency is not evaluated in the absence of IV contrast. A biliary stent is visible at the liver hilum and duodenum. Other visible noncontrast abdominal viscera appear negative. Partially visible small layering pleural effusions and lung base atelectasis. Lumbar paraspinal soft tissues within normal limits. Disc levels: Diffuse advanced lumbar disc degeneration. Vacuum disc throughout. Associated multilevel multifactorial lumbar spinal stenosis appears stable from the recent CT this month, mild at L2-L3, moderate to severe at L3-L4, severe at L4-L5. IMPRESSION: 1. Diffuse osteopenia. Chronic T12 inferior endplate compression fracture. No acute osseous abnormality in the Lumbar Spine. 2. Advanced lumbar disc degeneration with multilevel spinal stenosis, stable  since 08/02/2022 and moderate or severe at L3-L4 and L4-L5. 3. Partially visible grossly stable infrarenal abdominal aortic aneurysm, 3.1 cm. Recommend follow-up every 3 years. Reference: J Am Coll Radiol 3016;01:093-235. Aortic aneurysm NOS (ICD10-I71.9). Aortic Atherosclerosis (ICD10-I70.0). 4. Biliary stent. Small layering pleural effusions and lung base atelectasis. Electronically Signed   By: Genevie Ann M.D.   On: 08/13/2022 09:38    Pending Labs Unresulted Labs (From admission, onward)     Start     Ordered   08/15/22 0500  Comprehensive metabolic panel  Daily,   R  08/14/22 0909   08/15/22 0500  CBC  Daily,   R      08/14/22 0909   08/14/22 1049  Legionella Pneumophila Serogp 1 Ur Ag  Once,   R        08/14/22 1048   08/14/22 1049  Strep pneumoniae urinary antigen  Once,   R        08/14/22 1048   08/14/22 1049  Respiratory (~20 pathogens) panel by PCR  (Respiratory panel by PCR (~20 pathogens, ~24 hr TAT)  w precautions)  Once,   R        08/14/22 1048   08/14/22 1049  Expectorated Sputum Assessment w Gram Stain, Rflx to Resp Cult  Once,   R        08/14/22 1048   08/14/22 1023  MRSA Next Gen by PCR, Nasal  (MRSA Screening)  Once,   R        08/14/22 1022   08/13/22 1141  Blood culture (routine x 2)  BLOOD CULTURE X 2,   R (with STAT occurrences)      08/13/22 1140   08/13/22 0857  Urinalysis, Routine w reflex microscopic  (ED Abdominal Pain)  Once,   URGENT        08/13/22 0856            Vitals/Pain Today's Vitals   08/14/22 0908 08/14/22 1148 08/14/22 1345 08/14/22 1515  BP:   (!) 100/46 105/74  Pulse:   80 89  Resp:   18   Temp: 97.7 F (36.5 C) 98.2 F (36.8 C)  98.1 F (36.7 C)  TempSrc: Oral Oral  Oral  SpO2:   98% 100%  Weight:      Height:      PainSc:        Isolation Precautions Droplet precaution  Medications Medications  lactated ringers infusion (0 mLs Intravenous Stopped 08/14/22 1045)  acetaminophen (TYLENOL) tablet 650 mg (650 mg Oral  Not Given 08/13/22 2053)    Or  acetaminophen (TYLENOL) suppository 650 mg ( Rectal See Alternative 08/13/22 2053)  ondansetron (ZOFRAN) tablet 4 mg (has no administration in time range)    Or  ondansetron (ZOFRAN) injection 4 mg (has no administration in time range)  HYDROmorphone (DILAUDID) injection 0.5 mg (0.5 mg Intravenous Given 08/13/22 1440)  ceFEPIme (MAXIPIME) 2 g in sodium chloride 0.9 % 100 mL IVPB (2 g Intravenous New Bag/Given 08/14/22 1303)  levothyroxine (SYNTHROID) tablet 88 mcg (0 mcg Oral Hold 08/13/22 2228)  donepezil (ARICEPT) tablet 10 mg (10 mg Oral Given 08/14/22 0909)  citalopram (CELEXA) tablet 10 mg (10 mg Oral Given 08/14/22 0909)  aspirin EC tablet 81 mg (81 mg Oral Given 08/14/22 0909)  vancomycin (VANCOREADY) IVPB 750 mg/150 mL (has no administration in time range)  lactated ringers bolus 1,000 mL (0 mLs Intravenous Stopped 08/13/22 1407)  ceFEPIme (MAXIPIME) 2 g in sodium chloride 0.9 % 100 mL IVPB (0 g Intravenous Stopped 08/13/22 1327)  metroNIDAZOLE (FLAGYL) IVPB 500 mg (0 mg Intravenous Stopped 08/13/22 1407)  acetaminophen (OFIRMEV) IV 650 mg (0 mg Intravenous Stopped 08/13/22 2333)  vancomycin (VANCOREADY) IVPB 750 mg/150 mL (0 mg Intravenous Stopped 08/14/22 1518)  ibuprofen (ADVIL) tablet 200 mg (200 mg Oral Given 08/14/22 1524)    Mobility walks with device High fall risk   Focused Assessments Pt on 10L [place]   R Recommendations: See Admitting Provider Note  Report given to:   Additional Notes: needs urine, depend

## 2022-08-14 NOTE — ED Notes (Signed)
Pt getting ultrasound at this time. Will obtain temp once it's done.

## 2022-08-14 NOTE — Progress Notes (Signed)
PHARMACY - PHYSICIAN COMMUNICATION CRITICAL VALUE ALERT - BLOOD CULTURE IDENTIFICATION (BCID)  Jasmin Crane is an 88 y.o. female who presented to [facility] on 08/13/2022 with a chief complaint of weakness, back pain, emesis, abd pain. Also started on vanc/cefepime/flagyl for PNA  Assessment:  pseudomonas bacteremia (include suspected source if known)  Name of physician (or Provider) Contacted: Dr. Lupita Leash  Current antibiotics: vanc/cefepime/flagyl  Changes to prescribed antibiotics recommended:  D/C flagyl per Md, continue cefepime and vanc but d/c vanc if MRSA PCR comes back not detected  Results for orders placed or performed during the hospital encounter of 08/13/22  Blood Culture ID Panel (Reflexed) (Collected: 08/13/2022 11:41 AM)  Result Value Ref Range   Enterococcus faecalis NOT DETECTED NOT DETECTED   Enterococcus Faecium NOT DETECTED NOT DETECTED   Listeria monocytogenes NOT DETECTED NOT DETECTED   Staphylococcus species NOT DETECTED NOT DETECTED   Staphylococcus aureus (BCID) NOT DETECTED NOT DETECTED   Staphylococcus epidermidis NOT DETECTED NOT DETECTED   Staphylococcus lugdunensis NOT DETECTED NOT DETECTED   Streptococcus species NOT DETECTED NOT DETECTED   Streptococcus agalactiae NOT DETECTED NOT DETECTED   Streptococcus pneumoniae NOT DETECTED NOT DETECTED   Streptococcus pyogenes NOT DETECTED NOT DETECTED   A.calcoaceticus-baumannii NOT DETECTED NOT DETECTED   Bacteroides fragilis NOT DETECTED NOT DETECTED   Enterobacterales NOT DETECTED NOT DETECTED   Enterobacter cloacae complex NOT DETECTED NOT DETECTED   Escherichia coli NOT DETECTED NOT DETECTED   Klebsiella aerogenes NOT DETECTED NOT DETECTED   Klebsiella oxytoca NOT DETECTED NOT DETECTED   Klebsiella pneumoniae NOT DETECTED NOT DETECTED   Proteus species NOT DETECTED NOT DETECTED   Salmonella species NOT DETECTED NOT DETECTED   Serratia marcescens NOT DETECTED NOT DETECTED   Haemophilus influenzae NOT  DETECTED NOT DETECTED   Neisseria meningitidis NOT DETECTED NOT DETECTED   Pseudomonas aeruginosa DETECTED (A) NOT DETECTED   Stenotrophomonas maltophilia NOT DETECTED NOT DETECTED   Candida albicans NOT DETECTED NOT DETECTED   Candida auris NOT DETECTED NOT DETECTED   Candida glabrata NOT DETECTED NOT DETECTED   Candida krusei NOT DETECTED NOT DETECTED   Candida parapsilosis NOT DETECTED NOT DETECTED   Candida tropicalis NOT DETECTED NOT DETECTED   Cryptococcus neoformans/gattii NOT DETECTED NOT DETECTED   CTX-M ESBL NOT DETECTED NOT DETECTED   Carbapenem resistance IMP NOT DETECTED NOT DETECTED   Carbapenem resistance KPC NOT DETECTED NOT DETECTED   Carbapenem resistance NDM NOT DETECTED NOT DETECTED   Carbapenem resistance VIM NOT DETECTED NOT DETECTED    Kara Mead 08/14/2022  10:56 AM

## 2022-08-14 NOTE — Progress Notes (Signed)
Pharmacy Antibiotic Note  Talli Kimmer is a 88 y.o. female admitted on 08/13/2022 with pneumonia.  Pharmacy has been consulted for vanc dosing to add to cefepime and flagyl patient is already on. Patient also noted to have GNR in blood cultures waiting on final results  Plan: Vanc '750mg'$  IV q48 - goal AUC 400-550  Height: '5\' 2"'$  (157.5 cm) Weight: 44.9 kg (99 lb) IBW/kg (Calculated) : 50.1  Temp (24hrs), Avg:99.6 F (37.6 C), Min:97.7 F (36.5 C), Max:102.4 F (39.1 C)  Recent Labs  Lab 08/13/22 0915 08/13/22 1141 08/13/22 1341 08/13/22 1831 08/14/22 0419  WBC 17.8*  --   --   --  13.1*  CREATININE 1.04*  --   --   --  1.05*  LATICACIDVEN  --  3.7* 3.3* 2.2*  --     Estimated Creatinine Clearance: 26.3 mL/min (A) (by C-G formula based on SCr of 1.05 mg/dL (H)).    Allergies  Allergen Reactions   Other Anaphylaxis and Swelling    Scallops   Shellfish Allergy Anaphylaxis and Swelling   Cozaar [Losartan] Nausea Only   Pletaal [Cilostazol] Palpitations     Thank you for allowing pharmacy to be a part of this patient's care.  Kara Mead 08/14/2022 10:23 AM

## 2022-08-14 NOTE — ED Notes (Signed)
Call to [facility]W.  They will review chart and let me know when they are ready.  Advised that pt has been making urine in depend but urine has not been collected for lab.

## 2022-08-15 DIAGNOSIS — C221 Intrahepatic bile duct carcinoma: Secondary | ICD-10-CM | POA: Diagnosis not present

## 2022-08-15 DIAGNOSIS — A419 Sepsis, unspecified organism: Secondary | ICD-10-CM | POA: Diagnosis not present

## 2022-08-15 DIAGNOSIS — Z515 Encounter for palliative care: Secondary | ICD-10-CM | POA: Diagnosis not present

## 2022-08-15 DIAGNOSIS — Z7189 Other specified counseling: Secondary | ICD-10-CM

## 2022-08-15 LAB — CBC
HCT: 27.1 % — ABNORMAL LOW (ref 36.0–46.0)
Hemoglobin: 8.5 g/dL — ABNORMAL LOW (ref 12.0–15.0)
MCH: 27.3 pg (ref 26.0–34.0)
MCHC: 31.4 g/dL (ref 30.0–36.0)
MCV: 87.1 fL (ref 80.0–100.0)
Platelets: 134 10*3/uL — ABNORMAL LOW (ref 150–400)
RBC: 3.11 MIL/uL — ABNORMAL LOW (ref 3.87–5.11)
RDW: 18.8 % — ABNORMAL HIGH (ref 11.5–15.5)
WBC: 9.4 10*3/uL (ref 4.0–10.5)
nRBC: 0 % (ref 0.0–0.2)

## 2022-08-15 LAB — COMPREHENSIVE METABOLIC PANEL
ALT: 53 U/L — ABNORMAL HIGH (ref 0–44)
AST: 82 U/L — ABNORMAL HIGH (ref 15–41)
Albumin: 2.1 g/dL — ABNORMAL LOW (ref 3.5–5.0)
Alkaline Phosphatase: 277 U/L — ABNORMAL HIGH (ref 38–126)
Anion gap: 7 (ref 5–15)
BUN: 31 mg/dL — ABNORMAL HIGH (ref 8–23)
CO2: 24 mmol/L (ref 22–32)
Calcium: 8.2 mg/dL — ABNORMAL LOW (ref 8.9–10.3)
Chloride: 104 mmol/L (ref 98–111)
Creatinine, Ser: 1.02 mg/dL — ABNORMAL HIGH (ref 0.44–1.00)
GFR, Estimated: 53 mL/min — ABNORMAL LOW (ref >60)
Glucose, Bld: 84 mg/dL (ref 70–99)
Potassium: 3.2 mmol/L — ABNORMAL LOW (ref 3.5–5.1)
Sodium: 135 mmol/L (ref 135–145)
Total Bilirubin: 2.8 mg/dL — ABNORMAL HIGH (ref 0.3–1.2)
Total Protein: 5.3 g/dL — ABNORMAL LOW (ref 6.5–8.1)

## 2022-08-15 LAB — STREP PNEUMONIAE URINARY ANTIGEN: Strep Pneumo Urinary Antigen: NEGATIVE

## 2022-08-15 MED ORDER — HYDROMORPHONE HCL 1 MG/ML IJ SOLN
0.2500 mg | INTRAMUSCULAR | Status: DC | PRN
  Administered 2022-08-15 – 2022-08-16 (×3): 0.25 mg via INTRAVENOUS
  Administered 2022-08-17 (×2): 0.5 mg via INTRAVENOUS
  Filled 2022-08-15 (×2): qty 0.5
  Filled 2022-08-15 (×3): qty 1

## 2022-08-15 MED ORDER — SODIUM CHLORIDE 0.9 % IV BOLUS
250.0000 mL | Freq: Once | INTRAVENOUS | Status: AC
  Administered 2022-08-15: 250 mL via INTRAVENOUS

## 2022-08-15 MED ORDER — IBUPROFEN 200 MG PO TABS
200.0000 mg | ORAL_TABLET | Freq: Once | ORAL | Status: AC
  Administered 2022-08-15: 200 mg via ORAL
  Filled 2022-08-15: qty 1

## 2022-08-15 MED ORDER — POTASSIUM CHLORIDE 20 MEQ PO PACK
40.0000 meq | PACK | Freq: Once | ORAL | Status: AC
  Administered 2022-08-15: 40 meq via ORAL
  Filled 2022-08-15: qty 2

## 2022-08-15 MED ORDER — HYDROCODONE-ACETAMINOPHEN 5-325 MG PO TABS
1.0000 | ORAL_TABLET | Freq: Four times a day (QID) | ORAL | Status: DC | PRN
  Administered 2022-08-15 – 2022-08-17 (×2): 1 via ORAL
  Filled 2022-08-15 (×2): qty 1

## 2022-08-15 NOTE — Progress Notes (Signed)
Chaplain met with Jasmin Crane and her daughter.  Jasmin Crane was asking for some encouraging words and wondering how to "keep the faith."  Chaplain provided emotional and spiritual support through reflective listening and facilitating sharing of emotions. Chaplain also encouraged her to be gentle with herself as she is going through difficult times. Chaplain provided prayer and Jasmin Crane was very appreciative of that.  Chaplains will continue to follow, but please page as needs arise.  [address], [place] Pager, [phone]

## 2022-08-15 NOTE — Progress Notes (Deleted)
MD aware, soft bolus ordered

## 2022-08-15 NOTE — Progress Notes (Signed)
PROGRESS NOTE Jasmin Crane  JJH:[id number] DOB: [birth year] DOA: 08/13/2022 PCP: Hayden Rasmussen, MD   Brief Narrative/Hospital Course: 88 y.o.f w SSS carotid artery occlusion, fracture of the humerus, unspecified myopathy, peripheral vascular disease with claudication, CAD, cardiomegaly, chronic diastolic CHF, aortic valve regurgitation, hypertension, infrarenal AAA, hyperlipidemia, abnormal LFTs, Alzheimer's disease, hypothyroidism, emphysema, history of pneumonia, chronic constipation, recent admission 08/02/2022 to 08/05/2022 due to sepsis due to Klebsiella and E. coli, biliary obstruction in the setting of cholangiocarcinoma s/p ERCP with biliary stent placement and stricture brushing for pathology by Dr. Carol Ada then after discharge was seen at the cancer clinic by Dr. Benay Spice, but deferred medical/surgical treatment due to age and comorbidities, presented from assisted living facility [facility] due to bilateral lower back pain generalized weakness confusion/lethargy, wa found naked in bed.  Was declining 2 days PTA. She was also dyspneic and was on NRB mask oxygen. Seen in the ED on NRB and needing high flow nasal cannula overnight.   Labs obtained along with imaging: Obtained for sepsis with leukocytosis lactic acidosis transaminitis, COVID-19 influenza negative.  Blood culture urine culture sent.  Chest x-ray and CT lumbar spine done-showed diffuse osteopenia, chronic T12 endplate compression fracture, advanced lumbar DJD with stenosis, infrarenal stable abdominal aortic aneurysm 3.1 cm, biliary stent in place, chest x-ray asymmetric patchy opacity in the central left upper lobe suspicious for pneumonia. Admitted for further management Blood culture came back with Pseudomonas, chest x-ray worsening pneumonia  Subjective: Seen and examined this morning daughter and son at the bedside Patient appears much more comfortable alert awake She still complains of not being well, hurting  all over Overnight afebrile oxygen requirement improving from 15 L HFNC to 6 L  Labs with improved WBC count, renal function is stable LFTs downtrending and potassium low   Assessment and Plan:  Severe sepsis POA 2/2 Pseudomonas Left sided pneumonia Recent Klebsiella and E. coli sepsis due to biliary obstruction from cholangiocarcinoma: Suspect source likely pneumonia,?Biliary source with the stent in place but LFTs downtrending> right upper quadrant ultrasound mild intrahepatic biliary duct dilatation CBD only 3 mm> I will touch base with Dr. Benson Norway from GI. Continue cefepime discontinue vancomycin as MRSA negative.  F/u urine antigen and respiratory culture, continue pulmonary toileting.  Overall clinically improving overall. Recent Labs  Lab 08/13/22 0915 08/13/22 1141 08/13/22 1341 08/13/22 1831 08/14/22 0419 08/15/22 0322  WBC 17.8*  --   --   --  13.1* 9.4  LATICACIDVEN  --  3.7* 3.3* 2.2*  --   --     Mild hemoptysis likely from pneumonia.  White heparin Lovenox, monitor, if recurrent or large-volume will consult pulmonary.  Acute hypoxic respiratory failure due to pneumonia and abscess.  Wean oxygen as tolerated, continue pulmonary support.BNP elevated cxr  12/18 with significant interval increase in left upper and left lower lobe pneumonia.  Speech eval and diet as tolerated  Abnormal LFTs Cholangiocarcinoma-due to age and comorbidities not on treatment followed by Dr. Ammie Dalton S/P biliary stent on 08/04/22: Right upper quadrant ultrasound reviewed LFTs downtrending> no obvious evidence of biliary obstruction  gallbladder not visualized, mild intrahepatic biliary duct dilatation, extrahepatic common bile duct 3 mm echogenic liver parenchyma.  Trend LFTs. Recent Labs  Lab 08/13/22 0915 08/14/22 0419 08/15/22 0322  AST 138* 136* 82*  ALT 77* 74* 53*  ALKPHOS 486* 317* 277*  BILITOT 1.2 2.9* 2.8*  PROT 6.8 5.6* 5.3*  ALBUMIN 3.0* 2.4* 2.1*  INR  --  1.6*  --  AAA  stable  Essential hypertension Hyperlipidemia CAD: BP is well-controlled.  No complaint of chest pain.Continue aspirin.  Elevated troponin likely demand ischemia in the setting of sepsis: Continue her aspirin.  Hypothyroidism: Continue Synthroid.  Chronic diastolic CHF: Monitor fluid status volume status.  BNP is elevated awaiting further IV fluids. Net IO Since Admission: 679.05 mL [08/15/22 1035] Wt Readings from Last 3 Encounters:  08/13/22 44.9 kg  08/08/22 45.3 kg  08/04/22 43.4 kg    Alzheimer's dementia Acute metabolic encephalopathy present with confusion lethargy: Resolved, having multiple complaints. Continue delirium precaution fall precaution  Pain all over Chronic T12 endplate fracture: Complains of pain all over the body likely from sepsis deconditioning.  Added Norco as needed.  PT OT evaluation as tolerated  Chronic anemia: Stable hb, monitor and transfuse if less than 7 g Recent Labs  Lab 08/13/22 0915 08/14/22 0419 08/15/22 0322  HGB 10.4* 8.6* 8.5*  HCT 34.3* 28.1* 27.1*    Goals of care DNR prognosis is guarded palliative care consulted.  Patient family aware.  They were planning for meeting with outpatient hospice but has not happened yet.  High risk of decompensation.  Requested palliative care consultation while here.  DVT prophylaxis: Place and maintain sequential compression device Start: 08/15/22 0941 SCDs Start: 08/13/22 1353 Code Status:   Code Status: DNR Family Communication: plan of care discussed with patient/son  and daughter at bedside. Patient status is: Inpatient because of sepsis.  Given need for ongoing high flow nasal cannula we will request a stepdown bed Level of care: Stepdown > step down. Dispo: The patient is from: Assisted living facility            Anticipated disposition: TBD  Objective: Vitals last 24 hrs: Vitals:   08/15/22 0600 08/15/22 0700 08/15/22 0800 08/15/22 0802  BP: (!) 113/38 (!) 115/43 (!) 127/44   Pulse:  66 66 74   Resp: 17 17 (!) 23   Temp:    98.7 F (37.1 C)  TempSrc:    Oral  SpO2: 99% 98% 99%   Weight:      Height:       Weight change:   Physical Examination: General exam: AA, elderly frail appearing, on nasal cannula weak,older appearing. HEENT:Oral mucosa moist, Ear/Nose WNL grossly, dentition normal. Respiratory system: bilaterally clear, crackles in the left lung, no use of accessory muscle. Cardiovascular system: S1 & S2 +, regular rate, JVD neg Gastrointestinal system: Abdomen soft, NT,ND,BS+. Nervous System:Alert, awake, moving extremities and grossly nonfocal. Extremities:LE ankle edema neg,lower extremities warm. Skin: No rashes,no icterus. MSK: small muscle bulk, w/ weak tone and power.   Medications reviewed:  Scheduled Meds:  aspirin EC  81 mg Oral Daily   Chlorhexidine Gluconate Cloth  6 each Topical Daily   citalopram  10 mg Oral Daily   donepezil  10 mg Oral Daily   levothyroxine  88 mcg Oral QHS   mouth rinse  15 mL Mouth Rinse 4 times per day   Continuous Infusions:  ceFEPime (MAXIPIME) IV Stopped (08/14/22 1334)    Diet Order             DIET DYS 3 Room service appropriate? Yes; Fluid consistency: Thin  Diet effective now                   Intake/Output Summary (Last 24 hours) at 08/15/2022 1035 Last data filed at 08/15/2022 0800 Gross per 24 hour  Intake 570 ml  Output 1050 ml  Net -480  ml   Net IO Since Admission: 679.05 mL [08/15/22 1035]  Wt Readings from Last 3 Encounters:  08/13/22 44.9 kg  08/08/22 45.3 kg  08/04/22 43.4 kg    Unresulted Labs (From admission, onward)     Start     Ordered   08/15/22 0500  Comprehensive metabolic panel  Daily,   R      08/14/22 0909   08/15/22 0500  CBC  Daily,   R      08/14/22 0909   08/14/22 1049  Legionella Pneumophila Serogp 1 Ur Ag  Once,   R        08/14/22 1048   08/13/22 1141  Blood culture (routine x 2)  BLOOD CULTURE X 2,   R      08/13/22 1140          Data Reviewed: I  have personally reviewed following labs and imaging studies CBC: Recent Labs  Lab 08/13/22 0915 08/14/22 0419 08/15/22 0322  WBC 17.8* 13.1* 9.4  NEUTROABS 16.6*  --   --   HGB 10.4* 8.6* 8.5*  HCT 34.3* 28.1* 27.1*  MCV 88.2 88.9 87.1  PLT 157 120* 229*   Basic Metabolic Panel: Recent Labs  Lab 08/13/22 0915 08/14/22 0419 08/15/22 0322  NA 136 136 135  K 4.0 3.9 3.2*  CL 104 104 104  CO2 21* 25 24  GLUCOSE 143* 111* 84  BUN 24* 26* 31*  CREATININE 1.04* 1.05* 1.02*  CALCIUM 9.2 8.2* 8.2*   GFR: Estimated Creatinine Clearance: 27 mL/min (A) (by C-G formula based on SCr of 1.02 mg/dL (H)). Liver Function Tests: Recent Labs  Lab 08/13/22 0915 08/14/22 0419 08/15/22 0322  AST 138* 136* 82*  ALT 77* 74* 53*  ALKPHOS 486* 317* 277*  BILITOT 1.2 2.9* 2.8*  PROT 6.8 5.6* 5.3*  ALBUMIN 3.0* 2.4* 2.1*   Recent Labs  Lab 08/13/22 0915  LIPASE 35   No results for input(s): "AMMONIA" in the last 168 hours. Coagulation Profile: Recent Labs  Lab 08/14/22 0419  INR 1.6*  Sepsis Labs: Recent Labs  Lab 08/13/22 1141 08/13/22 1341 08/13/22 1831  LATICACIDVEN 3.7* 3.3* 2.2*    Recent Results (from the past 240 hour(s))  Blood culture (routine x 2)     Status: Abnormal (Preliminary result)   Collection Time: 08/13/22 11:41 AM   Specimen: BLOOD LEFT WRIST  Result Value Ref Range Status   Specimen Description   Final    BLOOD LEFT WRIST Performed at [facility] [address], [place], [place] [address]    Special Requests   Final    BOTTLES DRAWN AEROBIC AND ANAEROBIC Blood Culture adequate volume Performed at [facility] [address], [place], [place] [address]    Culture  Setup Time   Final    GRAM NEGATIVE RODS AEROBIC BOTTLE ONLY Organism ID to follow CRITICAL RESULT CALLED TO, READ BACK BY AND VERIFIED WITH: PHARMD MICHELLE BELL ON 12.18.23 AT 1054 BY EM    Culture (A)  Final    PSEUDOMONAS AERUGINOSA SUSCEPTIBILITIES  TO FOLLOW Performed at [facility], [place] [address], [place], [place] [address]    Report Status PENDING  Incomplete  Blood Culture ID Panel (Reflexed)     Status: Abnormal   Collection Time: 08/13/22 11:41 AM  Result Value Ref Range Status   Enterococcus faecalis NOT DETECTED NOT DETECTED Final   Enterococcus Faecium NOT DETECTED NOT DETECTED Final   Listeria monocytogenes NOT  DETECTED NOT DETECTED Final   Staphylococcus species NOT DETECTED NOT DETECTED Final   Staphylococcus aureus (BCID) NOT DETECTED NOT DETECTED Final   Staphylococcus epidermidis NOT DETECTED NOT DETECTED Final   Staphylococcus lugdunensis NOT DETECTED NOT DETECTED Final   Streptococcus species NOT DETECTED NOT DETECTED Final   Streptococcus agalactiae NOT DETECTED NOT DETECTED Final   Streptococcus pneumoniae NOT DETECTED NOT DETECTED Final   Streptococcus pyogenes NOT DETECTED NOT DETECTED Final   A.calcoaceticus-baumannii NOT DETECTED NOT DETECTED Final   Bacteroides fragilis NOT DETECTED NOT DETECTED Final   Enterobacterales NOT DETECTED NOT DETECTED Final   Enterobacter cloacae complex NOT DETECTED NOT DETECTED Final   Escherichia coli NOT DETECTED NOT DETECTED Final   Klebsiella aerogenes NOT DETECTED NOT DETECTED Final   Klebsiella oxytoca NOT DETECTED NOT DETECTED Final   Klebsiella pneumoniae NOT DETECTED NOT DETECTED Final   Proteus species NOT DETECTED NOT DETECTED Final   Salmonella species NOT DETECTED NOT DETECTED Final   Serratia marcescens NOT DETECTED NOT DETECTED Final   Haemophilus influenzae NOT DETECTED NOT DETECTED Final   Neisseria meningitidis NOT DETECTED NOT DETECTED Final   Pseudomonas aeruginosa DETECTED (A) NOT DETECTED Final    Comment: CRITICAL RESULT CALLED TO, READ BACK BY AND VERIFIED WITH: M. BELL PHARMD, AT 1054 08/14/22 BY E. MILLER    Stenotrophomonas maltophilia NOT DETECTED NOT DETECTED Final   Candida albicans NOT DETECTED NOT DETECTED Final   Candida auris NOT  DETECTED NOT DETECTED Final   Candida glabrata NOT DETECTED NOT DETECTED Final   Candida krusei NOT DETECTED NOT DETECTED Final   Candida parapsilosis NOT DETECTED NOT DETECTED Final   Candida tropicalis NOT DETECTED NOT DETECTED Final   Cryptococcus neoformans/gattii NOT DETECTED NOT DETECTED Final   CTX-M ESBL NOT DETECTED NOT DETECTED Final   Carbapenem resistance IMP NOT DETECTED NOT DETECTED Final   Carbapenem resistance KPC NOT DETECTED NOT DETECTED Final   Carbapenem resistance NDM NOT DETECTED NOT DETECTED Final   Carbapenem resistance VIM NOT DETECTED NOT DETECTED Final    Comment: Performed at [facility], [address], [place],  [address]  Resp panel by RT-PCR (RSV, Flu A&B, Covid) Anterior Nasal Swab     Status: None   Collection Time: 08/13/22 11:59 AM   Specimen: Anterior Nasal Swab  Result Value Ref Range Status   SARS Coronavirus 2 by RT PCR NEGATIVE NEGATIVE Final    Comment: (NOTE) SARS-CoV-2 target nucleic acids are NOT DETECTED.  The SARS-CoV-2 RNA is generally detectable in upper respiratory specimens during the acute phase of infection. The lowest concentration of SARS-CoV-2 viral copies this assay can detect is 138 copies/mL. A negative result does not preclude SARS-Cov-2 infection and should not be used as the sole basis for treatment or other patient management decisions. A negative result may occur with  improper specimen collection/handling, submission of specimen other than nasopharyngeal swab, presence of viral mutation(s) within the areas targeted by this assay, and inadequate number of viral copies(<138 copies/mL). A negative result must be combined with clinical observations, patient history, and epidemiological information. The expected result is Negative.  Fact Sheet for Patients:  EntrepreneurPulse.com.au  Fact Sheet for Healthcare Providers:  IncredibleEmployment.be  This test is no t yet  approved or cleared by the [place] FDA and  has been authorized for detection and/or diagnosis of SARS-CoV-2 by FDA under an Emergency Use Authorization (EUA). This EUA will remain  in effect (meaning this test can be used) for the duration  of the COVID-19 declaration under Section 564(b)(1) of the Act, 21 U.S.C.section 360bbb-3(b)(1), unless the authorization is terminated  or revoked sooner.       Influenza A by PCR NEGATIVE NEGATIVE Final   Influenza B by PCR NEGATIVE NEGATIVE Final    Comment: (NOTE) The Xpert Xpress SARS-CoV-2/FLU/RSV plus assay is intended as an aid in the diagnosis of influenza from Nasopharyngeal swab specimens and should not be used as a sole basis for treatment. Nasal washings and aspirates are unacceptable for Xpert Xpress SARS-CoV-2/FLU/RSV testing.  Fact Sheet for Patients: EntrepreneurPulse.com.au  Fact Sheet for Healthcare Providers: IncredibleEmployment.be  This test is not yet approved or cleared by the [place] FDA and has been authorized for detection and/or diagnosis of SARS-CoV-2 by FDA under an Emergency Use Authorization (EUA). This EUA will remain in effect (meaning this test can be used) for the duration of the COVID-19 declaration under Section 564(b)(1) of the Act, 21 U.S.C. section 360bbb-3(b)(1), unless the authorization is terminated or revoked.     Resp Syncytial Virus by PCR NEGATIVE NEGATIVE Final    Comment: (NOTE) Fact Sheet for Patients: EntrepreneurPulse.com.au  Fact Sheet for Healthcare Providers: IncredibleEmployment.be  This test is not yet approved or cleared by the [place] FDA and has been authorized for detection and/or diagnosis of SARS-CoV-2 by FDA under an Emergency Use Authorization (EUA). This EUA will remain in effect (meaning this test can be used) for the duration of the COVID-19 declaration under Section 564(b)(1) of  the Act, 21 U.S.C. section 360bbb-3(b)(1), unless the authorization is terminated or revoked.  Performed at [facility], [place] [address] [place], [place] [address]   MRSA Next Gen by PCR, Nasal     Status: None   Collection Time: 08/14/22 10:23 AM   Specimen: Nasal Mucosa; Nasal Swab  Result Value Ref Range Status   MRSA by PCR Next Gen NOT DETECTED NOT DETECTED Final    Comment: (NOTE) The GeneXpert MRSA Assay (FDA approved for NASAL specimens only), is one component of a comprehensive MRSA colonization surveillance program. It is not intended to diagnose MRSA infection nor to guide or monitor treatment for MRSA infections. Test performance is not FDA approved in patients less than 63 years old. Performed at [facility], [place] [address], [place], [place] [address]   Respiratory (~20 pathogens) panel by PCR     Status: None   Collection Time: 08/14/22 10:49 AM   Specimen: Nasal Mucosa; Respiratory  Result Value Ref Range Status   Adenovirus NOT DETECTED NOT DETECTED Final   Coronavirus 229E NOT DETECTED NOT DETECTED Final    Comment: (NOTE) The Coronavirus on the Respiratory Panel, DOES NOT test for the novel  Coronavirus (2019 nCoV)    Coronavirus HKU1 NOT DETECTED NOT DETECTED Final   Coronavirus NL63 NOT DETECTED NOT DETECTED Final   Coronavirus OC43 NOT DETECTED NOT DETECTED Final   Metapneumovirus NOT DETECTED NOT DETECTED Final   Rhinovirus / Enterovirus NOT DETECTED NOT DETECTED Final   Influenza A NOT DETECTED NOT DETECTED Final   Influenza B NOT DETECTED NOT DETECTED Final   Parainfluenza Virus 1 NOT DETECTED NOT DETECTED Final   Parainfluenza Virus 2 NOT DETECTED NOT DETECTED Final   Parainfluenza Virus 3 NOT DETECTED NOT DETECTED Final   Parainfluenza Virus 4 NOT DETECTED NOT DETECTED Final   Respiratory Syncytial Virus NOT DETECTED NOT DETECTED Final   Bordetella pertussis NOT DETECTED NOT DETECTED Final   Bordetella  Parapertussis NOT DETECTED NOT  DETECTED Final   Chlamydophila pneumoniae NOT DETECTED NOT DETECTED Final   Mycoplasma pneumoniae NOT DETECTED NOT DETECTED Final    Comment: Performed at [facility], [place] [address] [place], [place] [address]  Expectorated Sputum Assessment w Gram Stain, Rflx to Resp Cult     Status: None   Collection Time: 08/14/22 10:49 AM   Specimen: Expectorated Sputum  Result Value Ref Range Status   Specimen Description EXPECTORATED SPUTUM  Final   Special Requests NONE  Final   Sputum evaluation   Final    THIS SPECIMEN IS ACCEPTABLE FOR SPUTUM CULTURE Performed at [facility], [place] [address] [place], [place] [address]    Report Status 08/14/2022 FINAL  Final  Culture, Respiratory w Gram Stain     Status: None (Preliminary result)   Collection Time: 08/14/22 10:49 AM  Result Value Ref Range Status   Specimen Description   Final    EXPECTORATED SPUTUM Performed at [facility] [address] [place], [place] [address]    Special Requests   Final    NONE Reflexed from [id number] Performed at [facility], [place] [address] [place], [place] [address]    Gram Stain NO WBC SEEN NO ORGANISMS SEEN   Final   Culture   Final    NO GROWTH < 12 HOURS Performed at [facility], [place] [address] [place], [place] [address]    Report Status PENDING  Incomplete    Antimicrobials: Anti-infectives (From admission, onward)    Start     Dose/Rate Route Frequency Ordered Stop   08/16/22 1200  vancomycin (VANCOREADY) IVPB 750 mg/150 mL  Status:  Discontinued        750 mg 150 mL/hr over 60 Minutes Intravenous Every 48 hours 08/14/22 1026 08/15/22 0859   08/14/22 1200  ceFEPIme (MAXIPIME) 2 g in sodium chloride 0.9 % 100 mL IVPB        2 g 200 mL/hr over 30 Minutes Intravenous Every 24 hours 08/13/22 1819     08/14/22 1030  vancomycin (VANCOREADY) IVPB 750 mg/150 mL        750 mg 150 mL/hr over 60  Minutes Intravenous  Once 08/14/22 1026 08/14/22 1518   08/14/22 0000  metroNIDAZOLE (FLAGYL) IVPB 500 mg  Status:  Discontinued        500 mg 100 mL/hr over 60 Minutes Intravenous Every 12 hours 08/13/22 1813 08/14/22 1105   08/13/22 1230  ceFEPIme (MAXIPIME) 2 g in sodium chloride 0.9 % 100 mL IVPB        2 g 200 mL/hr over 30 Minutes Intravenous  Once 08/13/22 1225 08/13/22 1327   08/13/22 1230  metroNIDAZOLE (FLAGYL) IVPB 500 mg        500 mg 100 mL/hr over 60 Minutes Intravenous  Once 08/13/22 1225 08/13/22 1407      Culture/Microbiology    Component Value Date/Time   SDES EXPECTORATED SPUTUM 08/14/2022 1049   SDES  08/14/2022 1049    EXPECTORATED SPUTUM Performed at [facility], [place] [address] [place], [place] [address]    [place] 08/14/2022 1049   SPECREQUEST  08/14/2022 1049    NONE Reflexed from [id number] Performed at [facility], [place] [address], [place], [place] [address]    CULT  08/14/2022 1049    NO GROWTH < 12 HOURS Performed at [facility] [address] [place], [place] [address]    REPTSTATUS 08/14/2022 FINAL  08/14/2022 1049   REPTSTATUS PENDING 08/14/2022 1049    Radiology Studies: [facility] Chest Port 1 View  Result Date: 08/14/2022 CLINICAL DATA:  Hypoxia. EXAM: PORTABLE CHEST 1 VIEW COMPARISON:  08/13/2022 FINDINGS: Stable cardiomediastinal contours. Aortic atherosclerosis. Small left pleural effusion. Significant interval increase in asymmetric opacities within the left upper and left lower lobe. Right lung appears clear. Visualized osseous structures are unremarkable. IMPRESSION: 1. Significant interval increase in asymmetric opacities within the left upper and left lower lobe compatible with pneumonia. 2. Small left pleural effusion. Electronically Signed   By: Kerby Moors M.D.   On: 08/14/2022 10:03   US Abdomen Limited RUQ (LIVER/GB)  Result Date: 08/14/2022 CLINICAL DATA:  Elevated LFTs. EXAM:  ULTRASOUND ABDOMEN LIMITED RIGHT UPPER QUADRANT COMPARISON:  None Available. FINDINGS: Gallbladder: Gallbladder not visualized. Common bile duct: Diameter: Mild intrahepatic biliary duct dilatation. Extrahepatic common bile duct measures 3 mm diameter. Liver: Heterogeneous echogenic liver parenchyma. Portal vein is patent on color Doppler imaging with normal direction of blood flow towards the liver. Other: None. IMPRESSION: 1. Gallbladder not visualized. 2. Mild intrahepatic biliary duct dilatation. Extrahepatic common bile duct measures 3 mm diameter. 3. Heterogeneous echogenic liver parenchyma. This is a nonspecific finding but can be seen in the setting of hepatic steatosis and/or hepatocellular disease. Electronically Signed   By: Misty Stanley M.D.   On: 08/14/2022 09:15   DG Chest Port 1 View  Result Date: 08/13/2022 CLINICAL DATA:  Sepsis.  Nausea and vomiting. EXAM: PORTABLE CHEST 1 VIEW COMPARISON:  08/02/2022 FINDINGS: Stable mild cardiomegaly. Pulmonary hyperinflation again demonstrated, consistent with COPD. Patient is rotated to the left. Asymmetric patchy opacity is seen in the central left upper lobe, suspicious for pneumonia. Blunting of the left costophrenic angle may be due to tiny left pleural effusion. IMPRESSION: Asymmetric patchy opacity in central left upper lobe, suspicious for pneumonia. Cardiomegaly and COPD. Electronically Signed   By: Marlaine Hind M.D.   On: 08/13/2022 13:14     LOS: 2 days   Antonieta Pert, MD [facility]  08/15/2022, 10:35 AM

## 2022-08-15 NOTE — Consult Note (Cosign Needed Addendum)
Consultation Note Date: 08/15/2022   Patient Name: Jasmin Crane  DOB: 01/08/1932  MRN: [id number]  Age / Sex: 88 y.o., female  PCP: Hayden Rasmussen, MD Referring Physician: Antonieta Pert, MD  Reason for Consultation: Establishing goals of care  HPI/Patient Profile: 88 y.o. female  with past medical history of sick sinus syndrome, carotid artery occlusion, humerus fracture, PVD, CAD, HFpEF, aortic valve regurgitation, HTN, HLD, infrarenal AAA, abnormal LFTs, Alzheimer's disease, hypothyroidism, emphysema, recently diagnosed cholangiocarcinoma s/p biliary stent placement, recent sepsis due to Klebsiella and E. coli admitted on 08/13/2022 from [facility] with back pain, weakness, confusion related to sepsis.   Clinical Assessment and Goals of Care: I have reviewed records and been updated by Dr. Lupita Leash and Dr. Benson Norway. Extensive chart review and note conversation with Dr. Benay Spice and plans for hospice care at home.   I met today with Ms. Jasmin Crane along with her daughter, [name], at bedside. We discussed her diagnosis and concern for pneumonia, bacteremia, and concern for potential source of infection from biliary. Unfortunately there are not any good options from GI to add to improvement. We discussed that we can provide antibiotics but I worry about how well she improve overall given her underlying cholangiocarcinoma. Jasmin Crane shares with [place] that she is realistic about her poor prognosis. She shares that she is having pain more across her abd and through her back. We discussed pain management and how we can assist to provide her some relief.   I discussed with Jasmin Crane her goals of care and we discussed her wishes for care moving forward with continued care with antibiotics, IVs, and lab sticks vs more a shift towards comfort care focusing solely on symptom management. Jasmin Crane shares her desire for comfort care. She  shares that she does not wish to suffer and does not feel she will improve to a quality of life she desires. [name] expresses her concern for making any drastic decisions right now and I explain that there is no rush to make any decisions or changes but just a conversation for Jasmin Crane to make her thoughts and wishes known. After discussion we agree to provide more medication to assist with pain management and provide relief and to continue current measures at this time. We did discuss the potential of considering transition to [facility] from the hospital. I discussed more with [name] that functional and nutritional status are important to improving from any infection and I worry her body is in a weakened state due to underlying cancer to have significant improvement. They were in the process of setting up hospice outpatient so they would not be here in this position.   Jasmin Crane son arrived to bedside and I spoke further with [name] and her brother. We updated him on conversation above. They express support of the decisions Jasmin Crane makes but also concern that we do not stop support prematurely that could add to her some quality time with her family and friends. They also express concern for medication for pain  and comfort and I reassure them that the pain medication she is receiving is the same we would provide even if she were not discussing potential for comfort and I anticipate she will not need significant pain medication to provide her some relief at this time. However, her symptoms may change and evolve overtime when she may need increasing medications to provide her relief. I reassured them that we will take one step at a time and we will continue to support Jasmin Crane and family through this process.   All questions/concerns addressed. Emotional support provided.   Primary Decision Maker PATIENT    SUMMARY OF RECOMMENDATIONS   - DNR - Trial dilaudid for pain - may titrate up slowly  if needed to achieve relief - Patient considering transition to comfort focused care  Code Status/Advance Care Planning: DNR   Symptom Management:  Abd and back pain: Dilaudid 0.5 mg IV every 4 hours as needed for pain. May increase dose/frequency slowly if needed.   Palliative Prophylaxis:  Bowel Regimen, Delirium Protocol, and Frequent Pain Assessment  Psycho-social/Spiritual:  Desire for further Chaplaincy support:yes Additional Recommendations: Education on Hospice and Grief/Bereavement Support  Prognosis:  Prognosis poor with underlying cholangiocarcinoma.   Discharge Planning: To Be Determined      Primary Diagnoses: Present on Admission:  Sepsis due to undetermined organism [facility])  Acute respiratory failure with hypoxia ([place])  Alzheimer disease ([place])  Cholangiocarcinoma at hepatic hilum (HCC)  Chronic diastolic CHF (congestive heart failure) ([place])  Coronary artery disease due to lipid rich plaque  Essential hypertension  Hypothyroidism  Dyslipidemia  AAA (abdominal aortic aneurysm) without rupture (HCC)  Normocytic anemia  Abnormal LFTs  Mild protein malnutrition (HCC)  Elevated troponin   I have reviewed the medical record, interviewed the patient and family, and examined the patient. The following aspects are pertinent.  Past Medical History:  Diagnosis Date   Arrhythmia    Cancer [facility])    skin cancer 2005   Carotid artery occlusion    Claudication of lower extremity (HCC)    Complication of anesthesia    one bad experience 30-40  yrs ago, since had procedures went okay   Coronary artery disease    Diverticulitis    Hyperlipidemia    Hypertension    Hypothyroidism    Myopathy    Paralysis of tongue    Sick sinus syndrome (HCC)    Social History   Socioeconomic History   Marital status: Widowed    Spouse name: Not on file   Number of children: 3   Years of education: Not on file   Highest education level: Not on file  Occupational History     Comment: Worked for [employer]  Tobacco Use   Smoking status: Former    Types: Cigarettes   Smokeless tobacco: Never   Tobacco comments:    Quit tobacco around [employer] Use   Vaping Use: Never used  Substance and Sexual Activity   Alcohol use: Yes    Comment: rare cocktail   Drug use: Never   Sexual activity: Not on file  Other Topics Concern   Not on file  Social History Narrative   Not on file   Social Determinants of Health   Financial Resource Strain: Not on file  Food Insecurity: No Food Insecurity (08/14/2022)   Hunger Vital Sign    Worried About Running Out of Food in the Last Year: Never true    Ran Out of Food in the Last Year: Never  true  Transportation Needs: No Transportation Needs (08/14/2022)   PRAPARE - Hydrologist (Medical): No    Lack of Transportation (Non-Medical): No  Physical Activity: Not on file  Stress: Not on file  Social Connections: Not on file   Family History  Problem Relation Age of Onset   Cancer Mother    Stroke Mother    Heart disease Father 10       CAD   Heart failure Sister    Arthritis Brother    Lung cancer Brother    Pancreatic cancer Brother    Heart attack Son 18   Scheduled Meds:  aspirin EC  81 mg Oral Daily   Chlorhexidine Gluconate Cloth  6 each Topical Daily   citalopram  10 mg Oral Daily   donepezil  10 mg Oral Daily   levothyroxine  88 mcg Oral QHS   mouth rinse  15 mL Mouth Rinse 4 times per day   Continuous Infusions:  ceFEPime (MAXIPIME) IV Stopped (08/15/22 1203)   PRN Meds:.acetaminophen **OR** acetaminophen, HYDROcodone-acetaminophen, HYDROmorphone (DILAUDID) injection, ondansetron **OR** ondansetron (ZOFRAN) IV, mouth rinse Allergies  Allergen Reactions   Other Anaphylaxis and Swelling    Scallops   Shellfish Allergy Anaphylaxis and Swelling   Cozaar [Losartan] Nausea Only   Pletaal [Cilostazol] Palpitations   Review of Systems  Constitutional:  Positive for  activity change, appetite change and fatigue.  Respiratory:  Negative for shortness of breath.   Gastrointestinal:  Positive for abdominal pain.  Musculoskeletal:  Positive for back pain.  Neurological:  Positive for weakness.    Physical Exam Vitals and nursing note reviewed.  Constitutional:      General: She is not in acute distress.    Appearance: She is ill-appearing.  Cardiovascular:     Rate and Rhythm: Normal rate.  Pulmonary:     Effort: No tachypnea, accessory muscle usage or respiratory distress.  Abdominal:     Palpations: Abdomen is soft.  Neurological:     Mental Status: She is alert and oriented to person, place, and time.     Vital Signs: BP (!) 132/52 (BP Location: Right Arm)   Pulse 79   Temp 97.8 F (36.6 C) (Axillary)   Resp (!) 25   Ht _0  (1.575 m)   Wt 44.9 kg   SpO2 96%   BMI 18.11 kg/m  Pain Scale: 0-10   Pain Score: 6    SpO2: SpO2: 96 % O2 Device:SpO2: 96 % O2 Flow Rate: .O2 Flow Rate (L/min): 4 L/min  IO: Intake/output summary:  Intake/Output Summary (Last 24 hours) at 08/15/2022 1325 Last data filed at 08/15/2022 1203 Gross per 24 hour  Intake 644.8 ml  Output 1050 ml  Net -405.2 ml    LBM: Last BM Date :  (PTA) Baseline Weight: Weight: 44.9 kg Most recent weight: Weight: 44.9 kg     Palliative Assessment/Data:     Time In: 1215  Time Total: 85 min  Greater than 50%  of this time was spent counseling and coordinating care related to the above assessment and plan.  Signed by: Vinie Sill, NP Palliative Medicine Team Pager # [phone] (M-F 8a-5p) Team Phone # [phone] (Nights/Weekends)

## 2022-08-15 NOTE — Progress Notes (Signed)
[facility] [facility]) Hospital Liaison Note   This is a pending [place] hospice patient. Please call prior to discharge.    Please call with any hospice related questions or concerns.   Thank you  Zigmund Gottron RN [place] Liaison [phone]

## 2022-08-16 DIAGNOSIS — C221 Intrahepatic bile duct carcinoma: Secondary | ICD-10-CM | POA: Diagnosis not present

## 2022-08-16 DIAGNOSIS — Z515 Encounter for palliative care: Secondary | ICD-10-CM | POA: Diagnosis not present

## 2022-08-16 DIAGNOSIS — Z7189 Other specified counseling: Secondary | ICD-10-CM | POA: Diagnosis not present

## 2022-08-16 DIAGNOSIS — A419 Sepsis, unspecified organism: Secondary | ICD-10-CM | POA: Diagnosis not present

## 2022-08-16 LAB — COMPREHENSIVE METABOLIC PANEL
ALT: 42 U/L (ref 0–44)
AST: 65 U/L — ABNORMAL HIGH (ref 15–41)
Albumin: 2 g/dL — ABNORMAL LOW (ref 3.5–5.0)
Alkaline Phosphatase: 312 U/L — ABNORMAL HIGH (ref 38–126)
Anion gap: 7 (ref 5–15)
BUN: 30 mg/dL — ABNORMAL HIGH (ref 8–23)
CO2: 24 mmol/L (ref 22–32)
Calcium: 8.2 mg/dL — ABNORMAL LOW (ref 8.9–10.3)
Chloride: 104 mmol/L (ref 98–111)
Creatinine, Ser: 1.11 mg/dL — ABNORMAL HIGH (ref 0.44–1.00)
GFR, Estimated: 48 mL/min — ABNORMAL LOW (ref >60)
Glucose, Bld: 110 mg/dL — ABNORMAL HIGH (ref 70–99)
Potassium: 3.8 mmol/L (ref 3.5–5.1)
Sodium: 135 mmol/L (ref 135–145)
Total Bilirubin: 1.6 mg/dL — ABNORMAL HIGH (ref 0.3–1.2)
Total Protein: 5 g/dL — ABNORMAL LOW (ref 6.5–8.1)

## 2022-08-16 LAB — LEGIONELLA PNEUMOPHILA SEROGP 1 UR AG: L. pneumophila Serogp 1 Ur Ag: NEGATIVE

## 2022-08-16 LAB — CBC
HCT: 26.1 % — ABNORMAL LOW (ref 36.0–46.0)
Hemoglobin: 8 g/dL — ABNORMAL LOW (ref 12.0–15.0)
MCH: 26.6 pg (ref 26.0–34.0)
MCHC: 30.7 g/dL (ref 30.0–36.0)
MCV: 86.7 fL (ref 80.0–100.0)
Platelets: 159 10*3/uL (ref 150–400)
RBC: 3.01 MIL/uL — ABNORMAL LOW (ref 3.87–5.11)
RDW: 19.1 % — ABNORMAL HIGH (ref 11.5–15.5)
WBC: 8 10*3/uL (ref 4.0–10.5)
nRBC: 0 % (ref 0.0–0.2)

## 2022-08-16 LAB — CULTURE, BLOOD (ROUTINE X 2): Special Requests: ADEQUATE

## 2022-08-16 MED ORDER — SODIUM CHLORIDE 0.9 % IV BOLUS
250.0000 mL | Freq: Once | INTRAVENOUS | Status: AC
  Administered 2022-08-16: 250 mL via INTRAVENOUS

## 2022-08-16 NOTE — Progress Notes (Signed)
Palliative:  HPI: 88 y.o. female  with past medical history of sick sinus syndrome, carotid artery occlusion, humerus fracture, PVD, CAD, HFpEF, aortic valve regurgitation, HTN, HLD, infrarenal AAA, abnormal LFTs, Alzheimer's disease, hypothyroidism, emphysema, recently diagnosed cholangiocarcinoma s/p biliary stent placement, recent sepsis due to Klebsiella and E. coli admitted on 08/13/2022 from [facility] with back pain, weakness, confusion related to sepsis.   I met again today at Ms. Fabro's bedside along with her children [name] and Jenny Reichmann. Ms. Noy has recently received pain medication and is sleeping comfortably at this time. Family at bedside share that they have had continued conversations and Ms. Posch has had contact with other friends and family. She is working on her goodbyes. They are still awaiting other family to come and visit with her but are open to transitioning more to comfort care at this time. We discussed controlling pain and symptoms as our priority. We discussed not escalating care and adding more. They wish to continue IVF and antibiotics while hospitalized but understand that these measures will not be continued at hospice. We discussed looking into transition to [facility] for comfort care and they agree with referral and transition. They are very concerned about moving Ms. Hirth to hospice and do not wish to put her through a transfer and move if her time is very limited. I explained that I feel that now is the perfect time to pursue transfer and if her condition changes while awaiting bed we can always cancel transfer and maintain her comfort here in the hospital. I explained that I would not anticipate drastic changes in condition when stopping fluids/antibiotics and she could likely have days to a week even after full comfort care transition.   All questions/concerns addressed. Emotional support provided. Updated Dr. Lupita Leash, RN Roselyn Reef, [facility], hospice liaison. Also requested  repeat spiritual care visit per family request. Her priest has already visited and completed anointing.   Exam: Sleeping comfortably after recent pain medication provided. VSS - BP on softer side. Breathing regular, unlabored. Abd flat.   Plan: - DNR - Comfort care (although family wish to continue IVF/antibiotics for now - understand this will not be continued in hospice) - Hopeful for transition to [place], NP Palliative Medicine Team Pager [phone] (Please see [email or web address] for schedule) Team Phone [phone]    Greater than 50%  of this time was spent counseling and coordinating care related to the above assessment and plan

## 2022-08-16 NOTE — Progress Notes (Signed)
SLP Cancellation Note  Patient Details Name: Khrystina Bonnes MRN: [id number] DOB: Sep 05, 1931   Cancelled treatment:       Reason Eval/Treat Not Completed: Other (comment) (order for dc SLP received, pt now comfort care -)  Kathleen Lime, MS [place] Office [phone] Pager [phone]  Macario Golds 08/16/2022, 12:42 PM

## 2022-08-16 NOTE — Progress Notes (Signed)
OT Cancellation Note  Patient Details Name: Steele Ledonne MRN: [id number] DOB: 12/05/1931   Cancelled Treatment:    Reason Eval/Treat Not Completed: Other (comment). Patient has been transitioned to comfort care and OT evaluation discontinued.  Jaliyah Fotheringham L Pamala Hayman 08/16/2022, 11:59 AM

## 2022-08-16 NOTE — Progress Notes (Signed)
[facility]) Hospital Liaison Note  Received request from Sheran Spine, [facility] for family interest in [facility]. Spoke with patient's son and daughter at bedside, to confirm interest and explain services. Eligibility confirmed per [facility] MD. Unfortunately [facility] is not able to offer a room today. Family and TOC aware hospital liaison will follow up tomorrow or sooner if room becomes available.   Please do not hesitate to call with any questions.    Thank you, Zigmund Gottron RN  [facility] Liaison [phone]

## 2022-08-16 NOTE — TOC Progression Note (Addendum)
Transition of Care [facility]) - Progression Note    Patient Details  Name: Jasmin Crane MRN: [id number] Date of Birth: 11/05/1931  Transition of Care [facility]) CM/SW Contact  Henrietta Dine, RN Phone Number:[phone] 08/16/2022, 3:27 PM  Clinical Narrative:    Notified by Nicholaus Corolla that pt approved for [facility]; awaiting bed; pt will be transported by [facility]; TOC will con't to follow.  [id number] no bed available at [facility] today.  Planned Disposition: Other ([place]) Barriers to Discharge: Continued Medical Work up  Expected Discharge Plan and Services   Discharge Planning Services: CM Consult   Living arrangements for the past 2 months: [place] ([place])                                       Social Determinants of Health (SDOH) Interventions [place]: No Food Insecurity (08/14/2022)  Housing: Low Risk  (08/14/2022)  Transportation Needs: No Transportation Needs (08/14/2022)  Utilities: Not At Risk (08/14/2022)  Tobacco Use: Medium Risk (08/13/2022)    Readmission Risk Interventions    08/16/2022   11:43 AM  Readmission Risk Prevention Plan  Transportation Screening Complete  PCP or Specialist Appt within 3-5 Days Complete  HRI or [place] Complete  Social Work Consult for [place] Planning/Counseling Complete  Palliative Care Screening Not Applicable  Medication Review Press photographer) Complete

## 2022-08-16 NOTE — Progress Notes (Signed)
PROGRESS NOTE Jasmin Crane  UXL:[id number] DOB: 1932-03-03 DOA: 08/13/2022 PCP: Hayden Rasmussen, MD   Brief Narrative/Hospital Course: 88 y.o.f w SSS carotid artery occlusion, fracture of the humerus, unspecified myopathy, peripheral vascular disease with claudication, CAD, cardiomegaly, chronic diastolic CHF, aortic valve regurgitation, hypertension, infrarenal AAA, hyperlipidemia, abnormal LFTs, Alzheimer's disease, hypothyroidism, emphysema, history of pneumonia, chronic constipation, recent admission 08/02/2022 to 08/05/2022 due to sepsis due to Klebsiella and E. coli, biliary obstruction in the setting of cholangiocarcinoma s/p ERCP with biliary stent placement and stricture brushing for pathology by Dr. Carol Ada then after discharge was seen at the cancer clinic by Dr. Benay Spice, but deferred medical/surgical treatment due to age and comorbidities, presented from assisted living facility [facility] due to bilateral lower back pain generalized weakness confusion/lethargy, wa found naked in bed.  Was declining 2 days PTA. She was also dyspneic and was on NRB mask oxygen. Seen in the ED on NRB and needing high flow nasal cannula overnight.   Labs obtained along with imaging: Obtained for sepsis with leukocytosis lactic acidosis transaminitis, COVID-19 influenza negative.  Blood culture urine culture sent.  Chest x-ray and CT lumbar spine done-showed diffuse osteopenia, chronic T12 endplate compression fracture, advanced lumbar DJD with stenosis, infrarenal stable abdominal aortic aneurysm 3.1 cm, biliary stent in place, chest x-ray asymmetric patchy opacity in the central left upper lobe suspicious for pneumonia. Admitted for further management Blood culture came back with Pseudomonas, chest x-ray worsening pneumonia  Subjective: Seen and examined this morning She is saying make me feel better Overnight hypotensive few times  Received fluid boluses small ones yesterday Labs stable this  morning She is bit lethargic today  No family at the bedside this am.  Assessment and Plan:  Severe sepsis POA 2/2 Pseudomonas Left sided pneumonia Recent Klebsiella and E. coli sepsis due to biliary obstruction from cholangiocarcinoma: Suspect source biliary and potentially secondary to pneumonia. Biliary stent in place but LFTs downtrending> right upper quadrant ultrasound mild intrahepatic biliary duct dilatation CBD only 3 mm> I discussed with Dr.Hung from GI> he feels that it is unsurprising patient is readmitted with sepsis likely biliary source, advised hospice eval. blood pressure has been soft in 53G to 64Q systolic> appears to be to tolerating antibiotics. Cont cefepime for now. Palliative care following with plan for slowly transitioning to comfort care.  Ordered small boluses for hypotension.  Recent Labs  Lab 08/13/22 0915 08/13/22 1141 08/13/22 1341 08/13/22 1831 08/14/22 0419 08/15/22 0322 08/16/22 0258  WBC 17.8*  --   --   --  13.1* 9.4 8.0  LATICACIDVEN  --  3.7* 3.3* 2.2*  --   --   --      Mild hemoptysis likely from pneumonia. MONITOR  Acute hypoxic respiratory failure due to pneumonia and sepsis. On 15 HFNC on admit, now on 4l. Cont same  Abnormal LFTs Cholangiocarcinoma-due to age and comorbidities not on treatment 2/2 age and comorbidities (Dr. Ammie Dalton) S/P biliary stent on 08/04/22: Right upper quadrant ultrasound reviewed LFTs downtrending> potentially possible obstruction -discussed with Dr Benson Norway, Does not advise any more procedures advised hospice eval  AAA stable  Essential hypertension Hyperlipidemia CAD: BP is SOFT.Continue aspirin.  Elevated troponin likely demand ischemia in the setting of sepsis: Continue her aspirin.  Hypothyroidism: Continue Synthroid.  Chronic diastolic CHF: Monitor fluid status volume status.  BNP is elevated, ivf only for hypotension. Net IO Since Admission: 423.85 mL [08/16/22 0744] Wt Readings from Last 3 Encounters:   08/13/22 44.9 kg  08/08/22 45.3 kg  08/04/22 43.4 kg    Alzheimer's dementia Acute metabolic encephalopathy present with confusion lethargy: Resolved,, cont delirium precaution fall precaution  Pain all over Chronic T12 endplate fracture: Complains of pain all over the body likely from sepsis deconditioning and cancer.  Appreciate palliative care input for pain management  Chronic anemia: Stable hb, monitor and transfuse if less than 7 g Recent Labs  Lab 08/13/22 0915 08/14/22 0419 08/15/22 0322 08/16/22 0258  HGB 10.4* 8.6* 8.5* 8.0*  HCT 34.3* 28.1* 27.1* 26.1*     Goals of care DNR prognosis is poor, palliative care following at this time patient considering transitioning to comfort measures. Keeping on IV antibiotics to help with her infection, but she has been hypotensive, needing IV pain medication.They were planning for meeting with outpatient hospice but has not happened yet.High risk of decompensation.  DVT prophylaxis: Place and maintain sequential compression device Start: 08/15/22 0941 SCDs Start: 08/13/22 1353 Code Status:   Code Status: DNR Family Communication: plan of care discussed with patient/son  and daughter at bedside previously. Patient status is: Inpatient because of sepsis.  Given need for ongoing high flow nasal cannula we will request a stepdown bed Level of care: Med-Surg. Dispo: The patient is from: Assisted living facility            Anticipated disposition: TBD/possible hospice  Objective: Vitals last 24 hrs: Vitals:   08/16/22 0400 08/16/22 0500 08/16/22 0600 08/16/22 0700  BP: (!) 77/34 (!) 103/38 (!) 72/28 (!) 86/40  Pulse: 72 71 64 63  Resp: (!) '9 10 11 10  '$ Temp:      TempSrc:      SpO2: 95% 95% 96% 94%  Weight:      Height:       Weight change:   Physical Examination: General exam: Bit more lethargic day from previous day, elderly frail on [place] HEENT:Oral mucosa moist, Ear/Nose WNL grossly, dentition normal. Respiratory system:  bilaterally clear BS, no use of accessory muscle Cardiovascular system: S1 & S2 +, regular rate. Gastrointestinal system: Abdomen soft,mildly tender,ND,BS+ Nervous System:Alert, awake, moving extremities and grossly nonfocal Extremities: LE ankle edema neg, lower extremities warm Skin: No rashes,no icterus. MSK: small muscle bulk, low tone/power   Medications reviewed:  Scheduled Meds:  aspirin EC  81 mg Oral Daily   Chlorhexidine Gluconate Cloth  6 each Topical Daily   citalopram  10 mg Oral Daily   donepezil  10 mg Oral Daily   levothyroxine  88 mcg Oral QHS   mouth rinse  15 mL Mouth Rinse 4 times per day   Continuous Infusions:  ceFEPime (MAXIPIME) IV Stopped (08/15/22 1203)   sodium chloride      Diet Order             DIET DYS 3 Room service appropriate? Yes; Fluid consistency: Thin  Diet effective now                   Intake/Output Summary (Last 24 hours) at 08/16/2022 0744 Last data filed at 08/16/2022 3329 Gross per 24 hour  Intake 1014.8 ml  Output 1150 ml  Net -135.2 ml    Net IO Since Admission: 423.85 mL [08/16/22 0744]  Wt Readings from Last 3 Encounters:  08/13/22 44.9 kg  08/08/22 45.3 kg  08/04/22 43.4 kg    Unresulted Labs (From admission, onward)     Start     Ordered   08/15/22 0500  Comprehensive metabolic panel  Daily,  R      08/14/22 0909   08/15/22 0500  CBC  Daily,   R      08/14/22 0909   08/14/22 1049  Legionella Pneumophila Serogp 1 Ur Ag  Once,   R        08/14/22 1048          Data Reviewed: I have personally reviewed following labs and imaging studies CBC: Recent Labs  Lab 08/13/22 0915 08/14/22 0419 08/15/22 0322 08/16/22 0258  WBC 17.8* 13.1* 9.4 8.0  NEUTROABS 16.6*  --   --   --   HGB 10.4* 8.6* 8.5* 8.0*  HCT 34.3* 28.1* 27.1* 26.1*  MCV 88.2 88.9 87.1 86.7  PLT 157 120* 134* 465    Basic Metabolic Panel: Recent Labs  Lab 08/13/22 0915 08/14/22 0419 08/15/22 0322 08/16/22 0258  NA 136 136 135 135   K 4.0 3.9 3.2* 3.8  CL 104 104 104 104  CO2 21* '25 24 24  '$ GLUCOSE 143* 111* 84 110*  BUN 24* 26* 31* 30*  CREATININE 1.04* 1.05* 1.02* 1.11*  CALCIUM 9.2 8.2* 8.2* 8.2*   KCL:EXNTZGYFV Creatinine Clearance: 24.8 mL/min (A) (by C-G formula based on SCr of 1.11 mg/dL (H)). Liver Function Tests: Recent Labs  Lab 08/13/22 0915 08/14/22 0419 08/15/22 0322 08/16/22 0258  AST 138* 136* 82* 65*  ALT 77* 74* 53* 42  ALKPHOS 486* 317* 277* 312*  BILITOT 1.2 2.9* 2.8* 1.6*  PROT 6.8 5.6* 5.3* 5.0*  ALBUMIN 3.0* 2.4* 2.1* 2.0*    Recent Labs  Lab 08/13/22 0915  LIPASE 35    No results for input(s): "AMMONIA" in the last 168 hours. Coagulation Profile: Recent Labs  Lab 08/14/22 0419  INR 1.6*   Sepsis Labs: Recent Labs  Lab 08/13/22 1141 08/13/22 1341 08/13/22 1831  LATICACIDVEN 3.7* 3.3* 2.2*    Recent Results (from the past 240 hour(s))  Blood culture (routine x 2)     Status: Abnormal   Collection Time: 08/13/22 11:41 AM   Specimen: BLOOD LEFT WRIST  Result Value Ref Range Status   Specimen Description   Final    BLOOD LEFT WRIST Performed at [facility] [address] [place], [place] [address]    Special Requests   Final    BOTTLES DRAWN AEROBIC AND ANAEROBIC Blood Culture adequate volume Performed at [facility] [address] [place], [place] [address]    Culture  Setup Time   Final    GRAM NEGATIVE RODS AEROBIC BOTTLE ONLY Organism ID to follow CRITICAL RESULT CALLED TO, READ BACK BY AND VERIFIED WITH: PHARMD MICHELLE BELL ON 12.18.23 AT 1054 BY EM Performed at [facility], [place] [address], [place], [place] [address]    Culture PSEUDOMONAS AERUGINOSA (A)  Final   Report Status 08/16/2022 FINAL  Final   Organism ID, Bacteria PSEUDOMONAS AERUGINOSA  Final      Susceptibility   Pseudomonas aeruginosa - MIC*    CEFTAZIDIME 2 SENSITIVE Sensitive     CIPROFLOXACIN 0.5 SENSITIVE Sensitive     GENTAMICIN 2 SENSITIVE  Sensitive     IMIPENEM 1 SENSITIVE Sensitive     PIP/TAZO 8 SENSITIVE Sensitive     CEFEPIME 2 SENSITIVE Sensitive     * PSEUDOMONAS AERUGINOSA  Blood Culture ID Panel (Reflexed)     Status: Abnormal   Collection Time: 08/13/22 11:41 AM  Result Value Ref Range Status   Enterococcus faecalis NOT DETECTED NOT DETECTED Final  Enterococcus Faecium NOT DETECTED NOT DETECTED Final   Listeria monocytogenes NOT DETECTED NOT DETECTED Final   Staphylococcus species NOT DETECTED NOT DETECTED Final   Staphylococcus aureus (BCID) NOT DETECTED NOT DETECTED Final   Staphylococcus epidermidis NOT DETECTED NOT DETECTED Final   Staphylococcus lugdunensis NOT DETECTED NOT DETECTED Final   Streptococcus species NOT DETECTED NOT DETECTED Final   Streptococcus agalactiae NOT DETECTED NOT DETECTED Final   Streptococcus pneumoniae NOT DETECTED NOT DETECTED Final   Streptococcus pyogenes NOT DETECTED NOT DETECTED Final   A.calcoaceticus-baumannii NOT DETECTED NOT DETECTED Final   Bacteroides fragilis NOT DETECTED NOT DETECTED Final   Enterobacterales NOT DETECTED NOT DETECTED Final   Enterobacter cloacae complex NOT DETECTED NOT DETECTED Final   Escherichia coli NOT DETECTED NOT DETECTED Final   Klebsiella aerogenes NOT DETECTED NOT DETECTED Final   Klebsiella oxytoca NOT DETECTED NOT DETECTED Final   Klebsiella pneumoniae NOT DETECTED NOT DETECTED Final   Proteus species NOT DETECTED NOT DETECTED Final   Salmonella species NOT DETECTED NOT DETECTED Final   Serratia marcescens NOT DETECTED NOT DETECTED Final   Haemophilus influenzae NOT DETECTED NOT DETECTED Final   Neisseria meningitidis NOT DETECTED NOT DETECTED Final   Pseudomonas aeruginosa DETECTED (A) NOT DETECTED Final    Comment: CRITICAL RESULT CALLED TO, READ BACK BY AND VERIFIED WITH: M. BELL PHARMD, AT 1054 08/14/22 BY E. MILLER    Stenotrophomonas maltophilia NOT DETECTED NOT DETECTED Final   Candida albicans NOT DETECTED NOT DETECTED Final    Candida auris NOT DETECTED NOT DETECTED Final   Candida glabrata NOT DETECTED NOT DETECTED Final   Candida krusei NOT DETECTED NOT DETECTED Final   Candida parapsilosis NOT DETECTED NOT DETECTED Final   Candida tropicalis NOT DETECTED NOT DETECTED Final   Cryptococcus neoformans/gattii NOT DETECTED NOT DETECTED Final   CTX-M ESBL NOT DETECTED NOT DETECTED Final   Carbapenem resistance IMP NOT DETECTED NOT DETECTED Final   Carbapenem resistance KPC NOT DETECTED NOT DETECTED Final   Carbapenem resistance NDM NOT DETECTED NOT DETECTED Final   Carbapenem resistance VIM NOT DETECTED NOT DETECTED Final    Comment: Performed at [facility], [address] [place], [place] [address]  Resp panel by RT-PCR (RSV, Flu A&B, Covid) Anterior Nasal Swab     Status: None   Collection Time: 08/13/22 11:59 AM   Specimen: Anterior Nasal Swab  Result Value Ref Range Status   SARS Coronavirus 2 by RT PCR NEGATIVE NEGATIVE Final    Comment: (NOTE) SARS-CoV-2 target nucleic acids are NOT DETECTED.  The SARS-CoV-2 RNA is generally detectable in upper respiratory specimens during the acute phase of infection. The lowest concentration of SARS-CoV-2 viral copies this assay can detect is 138 copies/mL. A negative result does not preclude SARS-Cov-2 infection and should not be used as the sole basis for treatment or other patient management decisions. A negative result may occur with  improper specimen collection/handling, submission of specimen other than nasopharyngeal swab, presence of viral mutation(s) within the areas targeted by this assay, and inadequate number of viral copies(<138 copies/mL). A negative result must be combined with clinical observations, patient history, and epidemiological information. The expected result is Negative.  Fact Sheet for Patients:  EntrepreneurPulse.com.au  Fact Sheet for Healthcare Providers:  IncredibleEmployment.be  This  test is no t yet approved or cleared by the [place] FDA and  has been authorized for detection and/or diagnosis of SARS-CoV-2 by FDA under an Emergency Use Authorization (EUA). This EUA will remain  in effect (meaning this test can be used) for the duration of the COVID-19 declaration under Section 564(b)(1) of the Act, 21 U.S.C.section 360bbb-3(b)(1), unless the authorization is terminated  or revoked sooner.       Influenza A by PCR NEGATIVE NEGATIVE Final   Influenza B by PCR NEGATIVE NEGATIVE Final    Comment: (NOTE) The Xpert Xpress SARS-CoV-2/FLU/RSV plus assay is intended as an aid in the diagnosis of influenza from Nasopharyngeal swab specimens and should not be used as a sole basis for treatment. Nasal washings and aspirates are unacceptable for Xpert Xpress SARS-CoV-2/FLU/RSV testing.  Fact Sheet for Patients: EntrepreneurPulse.com.au  Fact Sheet for Healthcare Providers: IncredibleEmployment.be  This test is not yet approved or cleared by the [place] FDA and has been authorized for detection and/or diagnosis of SARS-CoV-2 by FDA under an Emergency Use Authorization (EUA). This EUA will remain in effect (meaning this test can be used) for the duration of the COVID-19 declaration under Section 564(b)(1) of the Act, 21 U.S.C. section 360bbb-3(b)(1), unless the authorization is terminated or revoked.     Resp Syncytial Virus by PCR NEGATIVE NEGATIVE Final    Comment: (NOTE) Fact Sheet for Patients: EntrepreneurPulse.com.au  Fact Sheet for Healthcare Providers: IncredibleEmployment.be  This test is not yet approved or cleared by the [place] FDA and has been authorized for detection and/or diagnosis of SARS-CoV-2 by FDA under an Emergency Use Authorization (EUA). This EUA will remain in effect (meaning this test can be used) for the duration of the COVID-19 declaration under  Section 564(b)(1) of the Act, 21 U.S.C. section 360bbb-3(b)(1), unless the authorization is terminated or revoked.  Performed at [facility], [place] [address] [place], [place] [address]   MRSA Next Gen by PCR, Nasal     Status: None   Collection Time: 08/14/22 10:23 AM   Specimen: Nasal Mucosa; Nasal Swab  Result Value Ref Range Status   MRSA by PCR Next Gen NOT DETECTED NOT DETECTED Final    Comment: (NOTE) The GeneXpert MRSA Assay (FDA approved for NASAL specimens only), is one component of a comprehensive MRSA colonization surveillance program. It is not intended to diagnose MRSA infection nor to guide or monitor treatment for MRSA infections. Test performance is not FDA approved in patients less than 52 years old. Performed at [facility], [place] [address] [place], [place] [address]   Respiratory (~20 pathogens) panel by PCR     Status: None   Collection Time: 08/14/22 10:49 AM   Specimen: Nasal Mucosa; Respiratory  Result Value Ref Range Status   Adenovirus NOT DETECTED NOT DETECTED Final   Coronavirus 229E NOT DETECTED NOT DETECTED Final    Comment: (NOTE) The Coronavirus on the Respiratory Panel, DOES NOT test for the novel  Coronavirus (2019 nCoV)    Coronavirus HKU1 NOT DETECTED NOT DETECTED Final   Coronavirus NL63 NOT DETECTED NOT DETECTED Final   Coronavirus OC43 NOT DETECTED NOT DETECTED Final   Metapneumovirus NOT DETECTED NOT DETECTED Final   Rhinovirus / Enterovirus NOT DETECTED NOT DETECTED Final   Influenza A NOT DETECTED NOT DETECTED Final   Influenza B NOT DETECTED NOT DETECTED Final   Parainfluenza Virus 1 NOT DETECTED NOT DETECTED Final   Parainfluenza Virus 2 NOT DETECTED NOT DETECTED Final   Parainfluenza Virus 3 NOT DETECTED NOT DETECTED Final   Parainfluenza Virus 4 NOT DETECTED NOT DETECTED Final   Respiratory Syncytial Virus NOT DETECTED NOT DETECTED Final   Bordetella pertussis NOT DETECTED  NOT DETECTED  Final   Bordetella Parapertussis NOT DETECTED NOT DETECTED Final   Chlamydophila pneumoniae NOT DETECTED NOT DETECTED Final   Mycoplasma pneumoniae NOT DETECTED NOT DETECTED Final    Comment: Performed at [facility], [place] [address], [place], [place] [address]  Expectorated Sputum Assessment w Gram Stain, Rflx to Resp Cult     Status: None   Collection Time: 08/14/22 10:49 AM   Specimen: Expectorated Sputum  Result Value Ref Range Status   Specimen Description EXPECTORATED SPUTUM  Final   Special Requests NONE  Final   Sputum evaluation   Final    THIS SPECIMEN IS ACCEPTABLE FOR SPUTUM CULTURE Performed at [facility], [place] [address], [place], [place] [address]    Report Status 08/14/2022 FINAL  Final  Culture, Respiratory w Gram Stain     Status: None (Preliminary result)   Collection Time: 08/14/22 10:49 AM  Result Value Ref Range Status   Specimen Description   Final    EXPECTORATED SPUTUM Performed at [facility] [address] [place], [place] [address]    Special Requests   Final    NONE Reflexed from [id number] Performed at [facility], [place] [address] [place], [place] [address]    Gram Stain NO WBC SEEN NO ORGANISMS SEEN   Final   Culture   Final    NO GROWTH < 12 HOURS Performed at [facility], [place] [address] [place], [place] [address]    Report Status PENDING  Incomplete    Antimicrobials: Anti-infectives (From admission, onward)    Start     Dose/Rate Route Frequency Ordered Stop   08/16/22 1200  vancomycin (VANCOREADY) IVPB 750 mg/150 mL  Status:  Discontinued        750 mg 150 mL/hr over 60 Minutes Intravenous Every 48 hours 08/14/22 1026 08/15/22 0859   08/14/22 1200  ceFEPIme (MAXIPIME) 2 g in sodium chloride 0.9 % 100 mL IVPB        2 g 200 mL/hr over 30 Minutes Intravenous Every 24 hours 08/13/22 1819     08/14/22 1030  vancomycin (VANCOREADY) IVPB 750 mg/150 mL        750 mg 150  mL/hr over 60 Minutes Intravenous  Once 08/14/22 1026 08/14/22 1518   08/14/22 0000  metroNIDAZOLE (FLAGYL) IVPB 500 mg  Status:  Discontinued        500 mg 100 mL/hr over 60 Minutes Intravenous Every 12 hours 08/13/22 1813 08/14/22 1105   08/13/22 1230  ceFEPIme (MAXIPIME) 2 g in sodium chloride 0.9 % 100 mL IVPB        2 g 200 mL/hr over 30 Minutes Intravenous  Once 08/13/22 1225 08/13/22 1327   08/13/22 1230  metroNIDAZOLE (FLAGYL) IVPB 500 mg        500 mg 100 mL/hr over 60 Minutes Intravenous  Once 08/13/22 1225 08/13/22 1407      Culture/Microbiology    Component Value Date/Time   SDES EXPECTORATED SPUTUM 08/14/2022 1049   SDES  08/14/2022 1049    EXPECTORATED SPUTUM Performed at [facility], [place] [address], [place], [place] [address]    [place] 08/14/2022 1049   SPECREQUEST  08/14/2022 1049    NONE Reflexed from [id number] Performed at [facility], [place] [address], [place], [place] [address]    CULT  08/14/2022 1049    NO GROWTH < 12 HOURS Performed at [facility]  [address] [place], [place] [address]    REPTSTATUS 08/14/2022 FINAL 08/14/2022 1049   REPTSTATUS PENDING 08/14/2022 1049    Radiology Studies: [facility] Chest Port 1 View  Result Date: 08/14/2022 CLINICAL DATA:  Hypoxia. EXAM: PORTABLE CHEST 1 VIEW COMPARISON:  08/13/2022 FINDINGS: Stable cardiomediastinal contours. Aortic atherosclerosis. Small left pleural effusion. Significant interval increase in asymmetric opacities within the left upper and left lower lobe. Right lung appears clear. Visualized osseous structures are unremarkable. IMPRESSION: 1. Significant interval increase in asymmetric opacities within the left upper and left lower lobe compatible with pneumonia. 2. Small left pleural effusion. Electronically Signed   By: Kerby Moors M.D.   On: 08/14/2022 10:03   US Abdomen Limited RUQ (LIVER/GB)  Result Date: 08/14/2022 CLINICAL DATA:  Elevated  LFTs. EXAM: ULTRASOUND ABDOMEN LIMITED RIGHT UPPER QUADRANT COMPARISON:  None Available. FINDINGS: Gallbladder: Gallbladder not visualized. Common bile duct: Diameter: Mild intrahepatic biliary duct dilatation. Extrahepatic common bile duct measures 3 mm diameter. Liver: Heterogeneous echogenic liver parenchyma. Portal vein is patent on color Doppler imaging with normal direction of blood flow towards the liver. Other: None. IMPRESSION: 1. Gallbladder not visualized. 2. Mild intrahepatic biliary duct dilatation. Extrahepatic common bile duct measures 3 mm diameter. 3. Heterogeneous echogenic liver parenchyma. This is a nonspecific finding but can be seen in the setting of hepatic steatosis and/or hepatocellular disease. Electronically Signed   By: Misty Stanley M.D.   On: 08/14/2022 09:15     LOS: 3 days   Antonieta Pert, MD [facility]  08/16/2022, 7:44 AM

## 2022-08-16 NOTE — TOC Initial Note (Addendum)
Transition of Care [facility]) - Initial/Assessment Note    Patient Details  Name: Jasmin Crane MRN: [id number] Date of Birth: 1932-03-20  Transition of Care [facility]) CM/SW Contact:    Henrietta Dine, RN Phone Number: [phone] 08/16/2022, 11:43 AM  Clinical Narrative:                 [facility] consult for residential hospice; spoke w/ pt's dtr [name] and son Jenny Reichmann in room; pt asleep; they aree they would like information for [employer]; list of residential hospice given to [facility]; they will contact [facility]; pt will also be accessed by [facility]; TOC will follow.        Patient Goals and CMS Choice            Expected Discharge Plan and Services                                                Prior Living Arrangements/Services                       Activities of Daily Living Home Assistive Devices/Equipment: Gilford Rile (specify type), Shower chair without back, Grab bars around toilet, Grab bars in shower (2 wheel) ADL Screening (condition at time of admission) Patient's cognitive ability adequate to safely complete daily activities?: No Is the patient deaf or have difficulty hearing?: Yes Does the patient have difficulty seeing, even when wearing glasses/contacts?: No Does the patient have difficulty concentrating, remembering, or making decisions?: Yes Patient able to express need for assistance with ADLs?: Yes Does the patient have difficulty dressing or bathing?: No Independently performs ADLs?: No Communication: Independent Dressing (OT): Needs assistance Is this a change from baseline?: Change from baseline, expected to last >3 days Grooming: Needs assistance Is this a change from baseline?: Change from baseline, expected to last >3 days Feeding: Needs assistance Is this a change from baseline?: Change from baseline, expected to last <3 days Bathing: Needs assistance Is this a change from baseline?: Change from baseline, expected  to last <3 days Toileting: Needs assistance Is this a change from baseline?: Change from baseline, expected to last <3 days In/Out Bed: Needs assistance Is this a change from baseline?: Change from baseline, expected to last <3 days Walks in Home: Independent with device (comment) (walker) Does the patient have difficulty walking or climbing stairs?: Yes Weakness of Legs: Both Weakness of Arms/Hands: Right  Permission Sought/Granted                  Emotional Assessment              Admission diagnosis:  Acute respiratory failure with hypoxia ([place]) [J96.01] Hepatic adenocarcinoma ([place]) [C22.9] Severe comorbid illness [R69] Sepsis due to undetermined organism ([place]) [A41.9] Sepsis, due to unspecified organism, unspecified whether acute organ dysfunction present [facility]) [A41.9] Patient Active Problem List   Diagnosis Date Noted   Sepsis due to undetermined organism ([place]) 08/13/2022   Normocytic anemia 08/13/2022   Abnormal LFTs 08/13/2022   Mild protein malnutrition ([place]) 08/13/2022   Elevated troponin 08/13/2022   Cholangiocarcinoma at hepatic hilum ([place]) 08/08/2022   Biliary obstruction 08/02/2022   Pneumonia 01/23/2022   Fall at home, initial encounter 01/23/2022   Left foot pain 01/23/2022   Chronic constipation 01/23/2022   Chronic diastolic CHF (congestive heart failure) ([place]) 01/23/2022  Hypothyroidism 01/23/2022   Alzheimer disease ([place]) 01/23/2022   Emphysema lung ([place]) 03/09/2021   Pleural effusion 03/09/2021   Cardiomegaly 03/09/2021   Acute respiratory failure with hypoxia ([place]) 03/08/2021   Educated about COVID-19 virus infection 05/30/2020   Dyslipidemia 05/28/2019   Medication management 06/10/2018   Other fatigue 06/10/2018   PVD (peripheral vascular disease) ([place]) 06/10/2018   Right lower quadrant abdominal pain 06/10/2018   AAA (abdominal aortic aneurysm) without rupture ([place]) 06/10/2018   Aortic valve regurgitation 06/10/2018   Carotid artery  disease ([place]) 03/06/2017   Claudication in peripheral vascular disease ([place]) 03/06/2017   Essential hypertension 03/06/2017   Hyperlipidemia 03/06/2017   Coronary artery disease due to lipid rich plaque 03/06/2017   PCP:  Hayden Rasmussen, MD Pharmacy:   [facility] [place] [address] [place] [address] Phone: [phone] Fax: [phone]     Social Determinants of Health ([place]) Social History: [place]: No Food Insecurity (08/14/2022)  Housing: Low Risk  (08/14/2022)  Transportation Needs: No Transportation Needs (08/14/2022)  Utilities: Not At Risk (08/14/2022)  Tobacco Use: Medium Risk (08/13/2022)   SDOH Interventions: Housing Interventions: Intervention Not Indicated   Readmission Risk Interventions    08/16/2022   11:43 AM  Readmission Risk Prevention Plan  Transportation Screening Complete  PCP or Specialist Appt within 3-5 Days Complete  HRI or [place] Complete  Social Work Consult for [place] Planning/Counseling Complete  Palliative Care Screening Not Applicable  Medication Review Press photographer) Complete

## 2022-08-16 NOTE — Progress Notes (Signed)
Pharmacy Antibiotic Note  Jasmin Crane is a 88 y.o. female admitted on 08/13/2022 with  IAI .  Pharmacy has been consulted for cefepime dosing.  Plan: Continue Cefepime 2g IV q24 per current renal function  Height: '5\' 2"'$  (157.5 cm) Weight: 44.9 kg (99 lb) IBW/kg (Calculated) : 50.1  Temp (24hrs), Avg:97.5 F (36.4 C), Min:97.3 F (36.3 C), Max:97.8 F (36.6 C)  Recent Labs  Lab 08/13/22 0915 08/13/22 1141 08/13/22 1341 08/13/22 1831 08/14/22 0419 08/15/22 0322 08/16/22 0258  WBC 17.8*  --   --   --  13.1* 9.4 8.0  CREATININE 1.04*  --   --   --  1.05* 1.02* 1.11*  LATICACIDVEN  --  3.7* 3.3* 2.2*  --   --   --      Estimated Creatinine Clearance: 24.8 mL/min (A) (by C-G formula based on SCr of 1.11 mg/dL (H)).    Allergies  Allergen Reactions   Other Anaphylaxis and Swelling    Scallops   Shellfish Allergy Anaphylaxis and Swelling   Cozaar [Losartan] Nausea Only   Pletaal [Cilostazol] Palpitations      Thank you for allowing pharmacy to be a part of this patient's care.  Royetta Asal, PharmD, BCPS Clinical Pharmacist [place] Please utilize Amion for appropriate phone number to reach the unit pharmacist ([place]) 08/16/2022 8:11 AM

## 2022-08-17 DIAGNOSIS — A419 Sepsis, unspecified organism: Secondary | ICD-10-CM | POA: Diagnosis not present

## 2022-08-17 DIAGNOSIS — B370 Candidal stomatitis: Secondary | ICD-10-CM

## 2022-08-17 DIAGNOSIS — Z515 Encounter for palliative care: Secondary | ICD-10-CM | POA: Diagnosis not present

## 2022-08-17 LAB — CULTURE, RESPIRATORY W GRAM STAIN
Culture: NORMAL
Gram Stain: NONE SEEN

## 2022-08-17 MED ORDER — HYDROCODONE-ACETAMINOPHEN 5-325 MG PO TABS: 1.0000 | ORAL_TABLET | Freq: Four times a day (QID) | ORAL | 0 refills | Status: DC | PRN

## 2022-08-17 MED ORDER — MAGIC MOUTHWASH W/LIDOCAINE: 15.0000 mL | Freq: Four times a day (QID) | ORAL | Status: DC | PRN

## 2022-08-17 MED ORDER — MAGIC MOUTHWASH W/LIDOCAINE: 15.0000 mL | Freq: Four times a day (QID) | ORAL | 0 refills | Status: DC | PRN

## 2022-08-17 MED ORDER — NYSTATIN 100000 UNIT/ML MT SUSP
5.0000 mL | Freq: Four times a day (QID) | OROMUCOSAL | Status: DC
  Administered 2022-08-17: 500000 [IU] via ORAL
  Filled 2022-08-17: qty 5

## 2022-08-17 MED ORDER — NYSTATIN 100000 UNIT/ML MT SUSP: 5.0000 mL | Freq: Four times a day (QID) | OROMUCOSAL | 0 refills | Status: DC

## 2022-08-17 NOTE — Plan of Care (Signed)

## 2022-08-17 NOTE — Plan of Care (Signed)

## 2022-08-17 NOTE — Progress Notes (Signed)
Palliative:  HPI: 88 y.o. female  with past medical history of sick sinus syndrome, carotid artery occlusion, humerus fracture, PVD, CAD, HFpEF, aortic valve regurgitation, HTN, HLD, infrarenal AAA, abnormal LFTs, Alzheimer's disease, hypothyroidism, emphysema, recently diagnosed cholangiocarcinoma s/p biliary stent placement, recent sepsis due to Klebsiella and E. coli admitted on 08/13/2022 from [facility] with back pain, weakness, confusion related to sepsis.    I met today at Jasmin Crane's bedside and daughter, [name], at bedside. [name] shares that [employer] has notified her of bed availability today. Ms. Petre shares that she had discomfort overnight. They share that she has more discomfort with any movement or activity. We discussed providing the high dose of dilaudid prior to transfer to hospice and we can consider high dose at night to allow for improved rest. Lower dose can be offered during the day to allow comfort but wakefulness to visit with family if desired. Ms. Bristow also complains of thickness on her tongue and thrush noted. Will add medication for treatment.   All questions/concerns addressed. Emotional support provided. Discussed pain medication recommendations with RN.   Exam: Awake, alert. Some confusion noted. Thin, frail. Pale. Breathing regular, unlabored. Abd soft. Generalized fatigue and weakness.   Plan: - DNR, comfort care - D/C to [place]: Nystatin scheduled. Magic mouthwash with lidocaine PRN.   25 min  Vinie Sill, NP Palliative Medicine Team Pager [phone] (Please see [email or web address] for schedule) Team Phone [phone]    Greater than 50%  of this time was spent counseling and coordinating care related to the above assessment and plan

## 2022-08-17 NOTE — TOC Transition Note (Signed)
Transition of Care [facility]) - CM/SW Discharge Note   Patient Details  Name: Jasmin Crane MRN: [id number] Date of Birth: 15-Jun-1932  Transition of Care [facility]) CM/SW Contact:  Vassie Moselle, LCSW Phone Number: 08/17/2022, 10:33 AM   Clinical Narrative:    Pt is to transfer to [facility] for residential hospice. RN to call report to [phone]. [facility] called for transportation at 10:30am. Pt's daughter [name] confirmed and agreeable to transition to [facility].    Final next level of care: [place] Barriers to Discharge: Barriers Resolved   Patient Goals and CMS Choice Patient states their goals for this hospitalization and ongoing recovery are:: [facility] per pt's dtr [name]   Choice offered to / list presented to : Adult [place] ownership interest in [facility].provided to::  (NA)  Discharge Placement              Patient chooses bed at:  [facility]) Patient to be transferred to facility by: [place] Name of family member notified: Nancy Nordmann Patient and family notified of of transfer: 08/17/22  Discharge Plan and Services   Discharge Planning Services: CM Consult                                 Social Determinants of Health (SDOH) Interventions Housing Interventions: Intervention Not Indicated   Readmission Risk Interventions    08/17/2022   10:32 AM 08/16/2022   11:43 AM  Readmission Risk Prevention Plan  Transportation Screening Complete Complete  PCP or Specialist Appt within 3-5 Days Complete Complete  HRI or [place] Complete Complete  Social Work Consult for [place] Planning/Counseling Complete Complete  Palliative Care Screening Complete Not Applicable  Medication Review Press photographer) Complete Complete

## 2022-08-17 NOTE — Progress Notes (Signed)
Manufacturing engineer [facility]) Hospital Liaison Note  Bed offered and accepted for transfer today. Unit RN please call report to [phone] prior to patient leaving the unit. Please send signed DNR and paperwork with patient.   Please leave all IV access and ports in place.   Please call with any questions or concerns. Thank you  Roselee Nova, [place] Hospital Liaison [phone]

## 2022-08-17 NOTE — Consult Note (Signed)
   [facility] Inpatient Consult   08/17/2022  Jasmin Crane 1932-03-05 [id number]  [place] Organization [ACO] Patient: Medicare [place] [facility] Liaison remote coverage review for patient admitted to [name]  Chart reviewed for less than 30 days readmission and reveals the patient is currently transitioning to Hospice/Palliative Care facility at [name] when bed available.   Plan: Patient will have full case management services through Hospice and needs will be met at the hospice level of care. No [name] Care Management is planned for transitional needs. Will sign off at transition from hospital.  For questions,   Natividad Brood, RN BSN [place]  [phone] business mobile phone Toll free office [phone]  *[place]  [phone] Fax number: [phone] [email or web address]

## 2022-08-17 NOTE — Discharge Summary (Signed)
Physician Discharge Summary  Deaja Rizo XYI:[id number] DOB: March 15, 1932 DOA: 08/13/2022  PCP: Hayden Rasmussen, MD  Admit date: 08/13/2022 Discharge date: 08/17/2022 Recommendations for Outpatient Follow-up:  Follow up with hospice  Please obtain BMP/CBC in one week  Discharge Dispo: Hospice Discharge Condition: Stable Code Status:   Code Status: DNR Diet recommendation:  Diet Order             DIET DYS 3 Room service appropriate? Yes; Fluid consistency: Thin  Diet effective now                    Brief/Interim Summary: 88 y.o.f w SSS carotid artery occlusion, fracture of the humerus, unspecified myopathy, peripheral vascular disease with claudication, CAD, cardiomegaly, chronic diastolic CHF, aortic valve regurgitation, hypertension, infrarenal AAA, hyperlipidemia, abnormal LFTs, Alzheimer's disease, hypothyroidism, emphysema, history of pneumonia, chronic constipation, recent admission 08/02/2022 to 08/05/2022 due to sepsis due to Klebsiella and E. coli, biliary obstruction in the setting of cholangiocarcinoma s/p ERCP with biliary stent placement and stricture brushing for pathology by Dr. Carol Ada then after discharge was seen at the cancer clinic by Dr. Benay Spice, but deferred medical/surgical treatment due to age and comorbidities, presented from assisted living facility [facility] due to bilateral lower back pain generalized weakness confusion/lethargy, wa found naked in bed.  Was declining 2 days PTA. She was also dyspneic and was on NRB mask oxygen. Seen in the ED on NRB and needing high flow nasal cannula overnight.   Labs obtained along with imaging: Obtained for sepsis with leukocytosis lactic acidosis transaminitis, COVID-19 influenza negative.  Blood culture urine culture sent.  Chest x-ray and CT lumbar spine done-showed diffuse osteopenia, chronic T12 endplate compression fracture, advanced lumbar DJD with stenosis, infrarenal stable abdominal aortic aneurysm 3.1  cm, biliary stent in place, chest x-ray asymmetric patchy opacity in the central left upper lobe suspicious for pneumonia. Admitted for further management Blood culture came back with Pseudomonas, chest x-ray worsening pneumonia Found to have severe sepsis with Pseudomonas treated with IV antibiotics overall clinically improving likely source biliary versus pneumonia, discussed with GI does not advise further procedure for biliary stent unless LFTs are downtrending.  Perative care was consulted, at this time changed to comfort care with plan for inpatient hospice  Discharge Diagnoses:  Principal Problem:   Sepsis due to undetermined organism [facility]) Active Problems:   Essential hypertension   Coronary artery disease due to lipid rich plaque   AAA (abdominal aortic aneurysm) without rupture (HCC)   Dyslipidemia   Acute respiratory failure with hypoxia (HCC)   Chronic diastolic CHF (congestive heart failure) (HCC)   Hypothyroidism   Alzheimer disease (HCC)   Cholangiocarcinoma at hepatic hilum (HCC)   Normocytic anemia   Abnormal LFTs   Mild protein malnutrition (HCC)   Elevated troponin Severe sepsis POA 2/2 Pseudomonas Left sided pneumonia Recent Klebsiella and E. coli sepsis due to biliary obstruction from cholangiocarcinoma: Suspect source biliary and potentially secondary to pneumonia. Biliary stent in place but LFTs downtrending> right upper quadrant ultrasound mild intrahepatic biliary duct dilatation CBD only 3 mm> I discussed with Dr.Hung from GI> he feels that it is unsurprising patient is readmitted with sepsis likely biliary source, advised hospice eval. blood pressure has been soft in 27M to 78M systolic> appears to be to tolerating antibiotics.  Managed IV cefepime until here with plan for discharge to inpatient hospice.  Recent Labs  Lab 08/13/22 0915 08/13/22 1141 08/13/22 1341 08/13/22 1831 08/14/22 0419 08/15/22 0322 08/16/22 0258  WBC 17.8*  --   --   --  13.1* 9.4  8.0  LATICACIDVEN  --  3.7* 3.3* 2.2*  --   --   --     Mild hemoptysis likely from pneumonia. MONITOR Acute hypoxic respiratory failure due to pneumonia and sepsis. On 15 HFNC on admit, now on 4l. Cont same  Abnormal LFTs Cholangiocarcinoma-due to age and comorbidities not on treatment 2/2 age and comorbidities (Dr. Ammie Dalton) S/P biliary stent on 08/04/22: Right upper quadrant ultrasound reviewed LFTs downtrending> potentially possible obstruction -discussed with Dr Benson Norway, Does not advise any more procedures advised hospice eval  AAA stable  Essential hypertension Hyperlipidemia CAD: BP is SOFT.Continue aspirin.  Elevated troponin likely demand ischemia in the setting of sepsis: Continue her aspirin.  Hypothyroidism: Continue Synthroid.  Chronic diastolic CHF: Monitor fluid status volume status.  BNP is elevated, ivf only for hypotension. Net IO Since Admission: 143.85 mL [08/17/22 0909] Wt Readings from Last 3 Encounters:  08/13/22 44.9 kg  08/08/22 45.3 kg  08/04/22 43.4 kg    Alzheimer's dementia Acute metabolic encephalopathy present with confusion lethargy: Resolved,, cont delirium precaution fall precaution  Pain all over Chronic T12 endplate fracture: Complains of pain all over the body likely from sepsis deconditioning and cancer.  Appreciate palliative care input for pain management  Chronic anemia: Stable hb Recent Labs  Lab 08/13/22 0915 08/14/22 0419 08/15/22 0322 08/16/22 0258  HGB 10.4* 8.6* 8.5* 8.0*  HCT 34.3* 28.1* 27.1* 26.1*    Goals of care DNR prognosis is poor, palliative care following at this time patient considering transitioning to comfort measures. Keeping on IV antibiotics to help with her infection, but she has been hypotensive, needing IV pain medication.They were planning for meeting with outpatient hospice but has not happened yet.High risk of decompensation.  Seen by palliative care transitioned to comfort measures.    Consults: GI,  PMT Subjective: Sleepy.  Son is at the bedside.  They are ready for inpatient hospice house today.  Discharge Exam: Vitals:   08/16/22 2030 08/17/22 0411  BP: (!) 135/58 111/64  Pulse: 72 81  Resp: 16 16  Temp: 97.7 F (36.5 C) 98.4 F (36.9 C)  SpO2: 97% 94%   General: Pt is sleepy, not in acute distress Cardiovascular: RRR, S1/S2 +, no rubs, no gallops Respiratory: CTA bilaterally, no wheezing, no rhonchi Abdominal: Soft, NT, ND, bowel sounds + Extremities: no edema, no cyanosis  Discharge Instructions  Discharge Instructions     No wound care   Complete by: As directed       Allergies as of 08/17/2022       Reactions   Other Anaphylaxis, Swelling   Scallops   Shellfish Allergy Anaphylaxis, Swelling   Cozaar [losartan] Nausea Only   Pletaal [cilostazol] Palpitations        Medication List     STOP taking these medications    amoxicillin-clavulanate 500-125 MG tablet Commonly known as: AUGMENTIN   aspirin EC 81 MG tablet   cholecalciferol 25 MCG (1000 UNIT) tablet Commonly known as: VITAMIN D3   METAMUCIL FIBER PO   metoprolol succinate 25 MG 24 hr tablet Commonly known as: TOPROL-XL   potassium chloride 20 MEQ packet Commonly known as: KLOR-CON   rosuvastatin 40 MG tablet Commonly known as: CRESTOR   saccharomyces boulardii 250 MG capsule Commonly known as: FLORASTOR   VITAMIN B12 PO       TAKE these medications    citalopram 10 MG tablet Commonly known as: CELEXA  Take 10 mg by mouth daily.   donepezil 10 MG tablet Commonly known as: ARICEPT Take 10 mg by mouth in the morning.   HYDROcodone-acetaminophen 5-325 MG tablet Commonly known as: NORCO/VICODIN Take 1 tablet by mouth every 6 (six) hours as needed for severe pain or moderate pain.   ipratropium 0.06 % nasal spray Commonly known as: ATROVENT Place 2 sprays into both nostrils See admin instructions. Instill 2 sprays into each nostril 1-2 times a day   levothyroxine 88  MCG tablet Commonly known as: SYNTHROID Take 88 mcg by mouth at bedtime.   magic mouthwash w/lidocaine Soln Take 15 mLs by mouth 4 (four) times daily as needed for mouth pain.   nystatin 100000 UNIT/ML suspension Commonly known as: MYCOSTATIN Take 5 mLs (500,000 Units total) by mouth 4 (four) times daily.        Allergies  Allergen Reactions   Other Anaphylaxis and Swelling    Scallops   Shellfish Allergy Anaphylaxis and Swelling   Cozaar [Losartan] Nausea Only   Pletaal [Cilostazol] Palpitations    The results of significant diagnostics from this hospitalization (including imaging, microbiology, ancillary and laboratory) are listed below for reference.    Microbiology: Recent Results (from the past 240 hour(s))  Blood culture (routine x 2)     Status: Abnormal   Collection Time: 08/13/22 11:41 AM   Specimen: BLOOD LEFT WRIST  Result Value Ref Range Status   Specimen Description   Final    BLOOD LEFT WRIST Performed at [facility], [address], [place], [place] [address]    Special Requests   Final    BOTTLES DRAWN AEROBIC AND ANAEROBIC Blood Culture adequate volume Performed at [facility] [address], [place], [place] [address]    Culture  Setup Time   Final    GRAM NEGATIVE RODS AEROBIC BOTTLE ONLY Organism ID to follow CRITICAL RESULT CALLED TO, READ BACK BY AND VERIFIED WITH: PHARMD MICHELLE BELL ON 12.18.23 AT 1054 BY EM Performed at [facility], [place] [address], [place], [place] [address]    Culture PSEUDOMONAS AERUGINOSA (A)  Final   Report Status 08/16/2022 FINAL  Final   Organism ID, Bacteria PSEUDOMONAS AERUGINOSA  Final      Susceptibility   Pseudomonas aeruginosa - MIC*    CEFTAZIDIME 2 SENSITIVE Sensitive     CIPROFLOXACIN 0.5 SENSITIVE Sensitive     GENTAMICIN 2 SENSITIVE Sensitive     IMIPENEM 1 SENSITIVE Sensitive     PIP/TAZO 8 SENSITIVE Sensitive     CEFEPIME 2 SENSITIVE Sensitive     * PSEUDOMONAS  AERUGINOSA  Blood Culture ID Panel (Reflexed)     Status: Abnormal   Collection Time: 08/13/22 11:41 AM  Result Value Ref Range Status   Enterococcus faecalis NOT DETECTED NOT DETECTED Final   Enterococcus Faecium NOT DETECTED NOT DETECTED Final   Listeria monocytogenes NOT DETECTED NOT DETECTED Final   Staphylococcus species NOT DETECTED NOT DETECTED Final   Staphylococcus aureus (BCID) NOT DETECTED NOT DETECTED Final   Staphylococcus epidermidis NOT DETECTED NOT DETECTED Final   Staphylococcus lugdunensis NOT DETECTED NOT DETECTED Final   Streptococcus species NOT DETECTED NOT DETECTED Final   Streptococcus agalactiae NOT DETECTED NOT DETECTED Final   Streptococcus pneumoniae NOT DETECTED NOT DETECTED Final   Streptococcus pyogenes NOT DETECTED NOT DETECTED Final   A.calcoaceticus-baumannii NOT DETECTED NOT DETECTED Final   Bacteroides fragilis NOT DETECTED NOT DETECTED Final   Enterobacterales NOT DETECTED NOT DETECTED  Final   Enterobacter cloacae complex NOT DETECTED NOT DETECTED Final   Escherichia coli NOT DETECTED NOT DETECTED Final   Klebsiella aerogenes NOT DETECTED NOT DETECTED Final   Klebsiella oxytoca NOT DETECTED NOT DETECTED Final   Klebsiella pneumoniae NOT DETECTED NOT DETECTED Final   Proteus species NOT DETECTED NOT DETECTED Final   Salmonella species NOT DETECTED NOT DETECTED Final   Serratia marcescens NOT DETECTED NOT DETECTED Final   Haemophilus influenzae NOT DETECTED NOT DETECTED Final   Neisseria meningitidis NOT DETECTED NOT DETECTED Final   Pseudomonas aeruginosa DETECTED (A) NOT DETECTED Final    Comment: CRITICAL RESULT CALLED TO, READ BACK BY AND VERIFIED WITH: M. BELL PHARMD, AT 1054 08/14/22 BY E. MILLER    Stenotrophomonas maltophilia NOT DETECTED NOT DETECTED Final   Candida albicans NOT DETECTED NOT DETECTED Final   Candida auris NOT DETECTED NOT DETECTED Final   Candida glabrata NOT DETECTED NOT DETECTED Final   Candida krusei NOT DETECTED NOT  DETECTED Final   Candida parapsilosis NOT DETECTED NOT DETECTED Final   Candida tropicalis NOT DETECTED NOT DETECTED Final   Cryptococcus neoformans/gattii NOT DETECTED NOT DETECTED Final   CTX-M ESBL NOT DETECTED NOT DETECTED Final   Carbapenem resistance IMP NOT DETECTED NOT DETECTED Final   Carbapenem resistance KPC NOT DETECTED NOT DETECTED Final   Carbapenem resistance NDM NOT DETECTED NOT DETECTED Final   Carbapenem resistance VIM NOT DETECTED NOT DETECTED Final    Comment: Performed at [facility], [place] [address] [place], [place] [address]  Resp panel by RT-PCR (RSV, Flu A&B, Covid) Anterior Nasal Swab     Status: None   Collection Time: 08/13/22 11:59 AM   Specimen: Anterior Nasal Swab  Result Value Ref Range Status   SARS Coronavirus 2 by RT PCR NEGATIVE NEGATIVE Final    Comment: (NOTE) SARS-CoV-2 target nucleic acids are NOT DETECTED.  The SARS-CoV-2 RNA is generally detectable in upper respiratory specimens during the acute phase of infection. The lowest concentration of SARS-CoV-2 viral copies this assay can detect is 138 copies/mL. A negative result does not preclude SARS-Cov-2 infection and should not be used as the sole basis for treatment or other patient management decisions. A negative result may occur with  improper specimen collection/handling, submission of specimen other than nasopharyngeal swab, presence of viral mutation(s) within the areas targeted by this assay, and inadequate number of viral copies(<138 copies/mL). A negative result must be combined with clinical observations, patient history, and epidemiological information. The expected result is Negative.  Fact Sheet for Patients:  EntrepreneurPulse.com.au  Fact Sheet for Healthcare Providers:  IncredibleEmployment.be  This test is no t yet approved or cleared by the [place] FDA and  has been authorized for detection and/or diagnosis of SARS-CoV-2  by FDA under an Emergency Use Authorization (EUA). This EUA will remain  in effect (meaning this test can be used) for the duration of the COVID-19 declaration under Section 564(b)(1) of the Act, 21 U.S.C.section 360bbb-3(b)(1), unless the authorization is terminated  or revoked sooner.       Influenza A by PCR NEGATIVE NEGATIVE Final   Influenza B by PCR NEGATIVE NEGATIVE Final    Comment: (NOTE) The Xpert Xpress SARS-CoV-2/FLU/RSV plus assay is intended as an aid in the diagnosis of influenza from Nasopharyngeal swab specimens and should not be used as a sole basis for treatment. Nasal washings and aspirates are unacceptable for Xpert Xpress SARS-CoV-2/FLU/RSV testing.  Fact Sheet for Patients: EntrepreneurPulse.com.au  Fact Sheet for  Healthcare Providers: IncredibleEmployment.be  This test is not yet approved or cleared by the [place] and has been authorized for detection and/or diagnosis of SARS-CoV-2 by FDA under an Emergency Use Authorization (EUA). This EUA will remain in effect (meaning this test can be used) for the duration of the COVID-19 declaration under Section 564(b)(1) of the Act, 21 U.S.C. section 360bbb-3(b)(1), unless the authorization is terminated or revoked.     Resp Syncytial Virus by PCR NEGATIVE NEGATIVE Final    Comment: (NOTE) Fact Sheet for Patients: EntrepreneurPulse.com.au  Fact Sheet for Healthcare Providers: IncredibleEmployment.be  This test is not yet approved or cleared by the [place] FDA and has been authorized for detection and/or diagnosis of SARS-CoV-2 by FDA under an Emergency Use Authorization (EUA). This EUA will remain in effect (meaning this test can be used) for the duration of the COVID-19 declaration under Section 564(b)(1) of the Act, 21 U.S.C. section 360bbb-3(b)(1), unless the authorization is terminated or revoked.  Performed at  [facility], [place] [address], [place], [place] [address]   MRSA Next Gen by PCR, Nasal     Status: None   Collection Time: 08/14/22 10:23 AM   Specimen: Nasal Mucosa; Nasal Swab  Result Value Ref Range Status   MRSA by PCR Next Gen NOT DETECTED NOT DETECTED Final    Comment: (NOTE) The GeneXpert MRSA Assay (FDA approved for NASAL specimens only), is one component of a comprehensive MRSA colonization surveillance program. It is not intended to diagnose MRSA infection nor to guide or monitor treatment for MRSA infections. Test performance is not FDA approved in patients less than 73 years old. Performed at [facility], [place] [address], [place], [place] [address]   Respiratory (~20 pathogens) panel by PCR     Status: None   Collection Time: 08/14/22 10:49 AM   Specimen: Nasal Mucosa; Respiratory  Result Value Ref Range Status   Adenovirus NOT DETECTED NOT DETECTED Final   Coronavirus 229E NOT DETECTED NOT DETECTED Final    Comment: (NOTE) The Coronavirus on the Respiratory Panel, DOES NOT test for the novel  Coronavirus (2019 nCoV)    Coronavirus HKU1 NOT DETECTED NOT DETECTED Final   Coronavirus NL63 NOT DETECTED NOT DETECTED Final   Coronavirus OC43 NOT DETECTED NOT DETECTED Final   Metapneumovirus NOT DETECTED NOT DETECTED Final   Rhinovirus / Enterovirus NOT DETECTED NOT DETECTED Final   Influenza A NOT DETECTED NOT DETECTED Final   Influenza B NOT DETECTED NOT DETECTED Final   Parainfluenza Virus 1 NOT DETECTED NOT DETECTED Final   Parainfluenza Virus 2 NOT DETECTED NOT DETECTED Final   Parainfluenza Virus 3 NOT DETECTED NOT DETECTED Final   Parainfluenza Virus 4 NOT DETECTED NOT DETECTED Final   Respiratory Syncytial Virus NOT DETECTED NOT DETECTED Final   Bordetella pertussis NOT DETECTED NOT DETECTED Final   Bordetella Parapertussis NOT DETECTED NOT DETECTED Final   Chlamydophila pneumoniae NOT DETECTED NOT DETECTED Final    Mycoplasma pneumoniae NOT DETECTED NOT DETECTED Final    Comment: Performed at [facility], [place]. [address] [place], [place] [address]  Expectorated Sputum Assessment w Gram Stain, Rflx to Resp Cult     Status: None   Collection Time: 08/14/22 10:49 AM   Specimen: Expectorated Sputum  Result Value Ref Range Status   Specimen Description EXPECTORATED SPUTUM  Final   Special Requests NONE  Final   Sputum evaluation   Final    THIS SPECIMEN IS ACCEPTABLE  FOR SPUTUM CULTURE Performed at [facility], [place] [address], [place], [place] [address]    Report Status 08/14/2022 FINAL  Final  Culture, Respiratory w Gram Stain     Status: None (Preliminary result)   Collection Time: 08/14/22 10:49 AM  Result Value Ref Range Status   Specimen Description   Final    EXPECTORATED SPUTUM Performed at [place] [address] [place], [place] [address]    Special Requests   Final    NONE Reflexed from [id number] Performed at [facility], [place] [address], [place], [place] [address]    Gram Stain NO WBC SEEN NO ORGANISMS SEEN   Final   Culture   Final    CULTURE REINCUBATED FOR BETTER GROWTH Performed at [facility], [place] [address] [place], [place] [address]    Report Status PENDING  Incomplete    Procedures/Studies: DG Chest Port 1 View  Result Date: 08/14/2022 CLINICAL DATA:  Hypoxia. EXAM: PORTABLE CHEST 1 VIEW COMPARISON:  08/13/2022 FINDINGS: Stable cardiomediastinal contours. Aortic atherosclerosis. Small left pleural effusion. Significant interval increase in asymmetric opacities within the left upper and left lower lobe. Right lung appears clear. Visualized osseous structures are unremarkable. IMPRESSION: 1. Significant interval increase in asymmetric opacities within the left upper and left lower lobe compatible with pneumonia. 2. Small left pleural effusion. Electronically Signed   By: Kerby Moors M.D.   On:  08/14/2022 10:03   US Abdomen Limited RUQ (LIVER/GB)  Result Date: 08/14/2022 CLINICAL DATA:  Elevated LFTs. EXAM: ULTRASOUND ABDOMEN LIMITED RIGHT UPPER QUADRANT COMPARISON:  None Available. FINDINGS: Gallbladder: Gallbladder not visualized. Common bile duct: Diameter: Mild intrahepatic biliary duct dilatation. Extrahepatic common bile duct measures 3 mm diameter. Liver: Heterogeneous echogenic liver parenchyma. Portal vein is patent on color Doppler imaging with normal direction of blood flow towards the liver. Other: None. IMPRESSION: 1. Gallbladder not visualized. 2. Mild intrahepatic biliary duct dilatation. Extrahepatic common bile duct measures 3 mm diameter. 3. Heterogeneous echogenic liver parenchyma. This is a nonspecific finding but can be seen in the setting of hepatic steatosis and/or hepatocellular disease. Electronically Signed   By: Misty Stanley M.D.   On: 08/14/2022 09:15   DG Chest Port 1 View  Result Date: 08/13/2022 CLINICAL DATA:  Sepsis.  Nausea and vomiting. EXAM: PORTABLE CHEST 1 VIEW COMPARISON:  08/02/2022 FINDINGS: Stable mild cardiomegaly. Pulmonary hyperinflation again demonstrated, consistent with COPD. Patient is rotated to the left. Asymmetric patchy opacity is seen in the central left upper lobe, suspicious for pneumonia. Blunting of the left costophrenic angle may be due to tiny left pleural effusion. IMPRESSION: Asymmetric patchy opacity in central left upper lobe, suspicious for pneumonia. Cardiomegaly and COPD. Electronically Signed   By: Marlaine Hind M.D.   On: 08/13/2022 13:14   CT Lumbar Spine Wo Contrast  Result Date: 08/13/2022 CLINICAL DATA:  88 year old female with back pain, weakness, vomiting. Recent ERCP. EXAM: CT LUMBAR SPINE WITHOUT CONTRAST TECHNIQUE: Multidetector CT imaging of the lumbar spine was performed without intravenous contrast administration. Multiplanar CT image reconstructions were also generated. RADIATION DOSE REDUCTION: This exam was  performed according to the departmental dose-optimization program which includes automated exposure control, adjustment of the mA and/or kV according to patient size and/or use of iterative reconstruction technique. COMPARISON:  CT Abdomen and Pelvis 08/02/2022. FINDINGS: Segmentation: Normal. Alignment: Stable. Mild S-shaped thoracolumbar scoliosis, relatively preserved lumbar lordosis. Vertebrae: Diffuse osteopenia. Mild to moderate T12 inferior endplate fracture appears unchanged. No acute  osseous abnormality identified. Visible sacrum and SI joints appear intact. Paraspinal and other soft tissues: Infrarenal abdominal aortic aneurysm again partially visible, diameter grossly stable at 31 mm. Underlying Aortoiliac calcified atherosclerosis. Vascular patency is not evaluated in the absence of IV contrast. A biliary stent is visible at the liver hilum and duodenum. Other visible noncontrast abdominal viscera appear negative. Partially visible small layering pleural effusions and lung base atelectasis. Lumbar paraspinal soft tissues within normal limits. Disc levels: Diffuse advanced lumbar disc degeneration. Vacuum disc throughout. Associated multilevel multifactorial lumbar spinal stenosis appears stable from the recent CT this month, mild at L2-L3, moderate to severe at L3-L4, severe at L4-L5. IMPRESSION: 1. Diffuse osteopenia. Chronic T12 inferior endplate compression fracture. No acute osseous abnormality in the Lumbar Spine. 2. Advanced lumbar disc degeneration with multilevel spinal stenosis, stable since 08/02/2022 and moderate or severe at L3-L4 and L4-L5. 3. Partially visible grossly stable infrarenal abdominal aortic aneurysm, 3.1 cm. Recommend follow-up every 3 years. Reference: J Am Coll Radiol 4010;27:253-664. Aortic aneurysm NOS (ICD10-I71.9). Aortic Atherosclerosis (ICD10-I70.0). 4. Biliary stent. Small layering pleural effusions and lung base atelectasis. Electronically Signed   By: Genevie Ann M.D.    On: 08/13/2022 09:38   DG ERCP  Result Date: 08/04/2022 CLINICAL DATA:  Biliary stricture EXAM: ERCP TECHNIQUE: Multiple spot images obtained with the fluoroscopic device and submitted for interpretation post-procedure. FLUOROSCOPY: Radiation Exposure Index (as provided by the fluoroscopic device): 19.3 mGy Kerma COMPARISON:  07/21/2022 FINDINGS: Nine intraoperative fluoroscopic images were provided for interpretation. The submitted images demonstrate removal of existing biliary stent with opacification of intra and extrahepatic bile ducts. There is severe stenosis of the proximal common hepatic duct. Final images demonstrate new biliary stent in place. IMPRESSION: ERCP images demonstrate high-grade long segment stenosis of the proximal common hepatic duct. These images were submitted for radiologic interpretation only. Please see the procedural report for the amount of contrast and the fluoroscopy time utilized. Electronically Signed   By: Miachel Roux M.D.   On: 08/04/2022 09:51   US Abdomen Limited RUQ (LIVER/GB)  Result Date: 08/02/2022 CLINICAL DATA:  Biliary dilatation. EXAM: ULTRASOUND ABDOMEN LIMITED RIGHT UPPER QUADRANT COMPARISON:  CT abdomen pelvis dated 08/02/2022. FINDINGS: Evaluation is limited due to overlying bowel gas and body habitus. Gallbladder: The gallbladder is not visualized. Common bile duct: Diameter: A stent is noted within the common bile duct. The common bile duct is poorly delineated. There is moderate dilatation of the intrahepatic biliary trees better seen on the CT. Liver: The liver is grossly unremarkable. Portal vein is patent on color Doppler imaging with normal direction of blood flow towards the liver. Other: None. IMPRESSION: Moderate dilatation of the intrahepatic biliary trees. A stent is seen within the common bile duct. Electronically Signed   By: Anner Crete M.D.   On: 08/02/2022 22:51   CT Abdomen Pelvis W Contrast  Result Date: 08/02/2022 CLINICAL DATA:   Abdominal pain EXAM: CT ABDOMEN AND PELVIS WITH CONTRAST TECHNIQUE: Multidetector CT imaging of the abdomen and pelvis was performed using the standard protocol following bolus administration of intravenous contrast. RADIATION DOSE REDUCTION: This exam was performed according to the departmental dose-optimization program which includes automated exposure control, adjustment of the mA and/or kV according to patient size and/or use of iterative reconstruction technique. CONTRAST:  183m OMNIPAQUE IOHEXOL 300 MG/ML  SOLN COMPARISON:  01/01/2018 FINDINGS: Lower chest: Coronary artery calcifications are seen. There is peribronchial thickening. There is slight increase in interstitial markings in the lower lung fields. There  is no pleural effusion. Hepatobiliary: Gallbladder is not seen. There is moderate to marked dilation of intrahepatic bile ducts. There is biliary stent in the lumen of extrahepatic bile ducts entering the duodenum. Pancreas: There is mild prominence of pancreatic duct. There is 8 mm fluid density structure in head of the pancreas with no significant interval change. Spleen: Unremarkable. Adrenals/Urinary Tract: Adrenals are unremarkable. There is no hydronephrosis. There are no renal or ureteral stones. There is 1.7 cm cyst in the midportion of right kidney. Urinary bladder is distended. There is no wall thickening in the bladder. Stomach/Bowel: Small hiatal hernia is seen. Stomach is not distended. There is slight prominence of duodenum. There is no significant dilation of jejunum and ileum. Appendix is not seen. Multiple diverticula seen in colon. There is no significant wall thickening in colon. Vascular/Lymphatic: Extensive atherosclerotic plaques and calcifications are seen in aorta and its major branches. There is ectasia of infrarenal abdominal aorta measuring up to 3.1 cm in diameter. Aorta measured 2.7 cm in maximum diameter in the previous study. There is 2.8 cm ectasia in distal abdominal  aorta close to its bifurcation. Reproductive: Uterus is unremarkable. There is 2.3 cm fluid density structure in left adnexa which measured 2.7 cm in diameter in the previous study. Other: There is no ascites or pneumoperitoneum. Musculoskeletal: Degenerative changes are noted in lumbar spine with disc space narrowing, bony spurs and encroachment of neural foramina at multiple levels. There is 40-50% decrease in height of body of T12 vertebra which is new. IMPRESSION: There is no evidence of intestinal obstruction or pneumoperitoneum. There is no hydronephrosis. There is moderate to marked dilation of intrahepatic bile ducts. Biliary stent is noted in x-ray body bile ducts. Possibility of malfunction of the stent should be considered. Increased interstitial markings in the lower lung fields may suggest scarring or subsegmental atelectasis. Coronary artery calcifications are seen. Aortic atherosclerosis.  There is 3.1 cm infrarenal aortic aneurysm. Recommend follow-up every 3 years.Reference: J Am Coll Radiol 0998;33:825-053. There is new 40-50% decrease in height of body of T12 vertebra which may be recent or old. Lumbar spondylosis. Small hiatal hernia. Diverticulosis of colon without signs of focal diverticulitis. Other findings as described in the body of the report. Electronically Signed   By: Elmer Picker M.D.   On: 08/02/2022 20:29   DG Chest Port 1 View  Result Date: 08/02/2022 CLINICAL DATA:  Fever. EXAM: PORTABLE CHEST 1 VIEW COMPARISON:  Jan 23, 2022. FINDINGS: The heart size and mediastinal contours are within normal limits. Both lungs are clear. Old right proximal humeral fracture. IMPRESSION: No active disease. Electronically Signed   By: Marijo Conception M.D.   On: 08/02/2022 17:44   DG ERCP  Result Date: 07/21/2022 CLINICAL DATA:  Stent placement. EXAM: ERCP COMPARISON:  CT AP, 01/01/2018. FLUOROSCOPY: Exposure Index (as provided by the fluoroscopic device): 25.7 mGy Kerma FINDINGS:  Multiple, limited oblique planar images of the RIGHT upper quadrant obtained C-arm. Images demonstrating flexible endoscopy, biliary duct cannulation, retrograde cholangiogram and plastic biliary stent placement. Long segment stricture at the hilum of the liver, and involving the RIGHT hepatic ducts. IMPRESSION: Fluoroscopic imaging for ERCP and biliary stent placement. Long segment biliary stricture at the hilum of the liver. For complete description of intra procedural findings, please see performing service dictation. Electronically Signed   By: Michaelle Birks M.D.   On: 07/21/2022 11:14    Labs: BNP (last 3 results) Recent Labs    01/23/22 1540 08/14/22 0419  BNP  648.0* 7,619.5*   Basic Metabolic Panel: Recent Labs  Lab 08/13/22 0915 08/14/22 0419 08/15/22 0322 08/16/22 0258  NA 136 136 135 135  K 4.0 3.9 3.2* 3.8  CL 104 104 104 104  CO2 21* '25 24 24  '$ GLUCOSE 143* 111* 84 110*  BUN 24* 26* 31* 30*  CREATININE 1.04* 1.05* 1.02* 1.11*  CALCIUM 9.2 8.2* 8.2* 8.2*   Liver Function Tests: Recent Labs  Lab 08/13/22 0915 08/14/22 0419 08/15/22 0322 08/16/22 0258  AST 138* 136* 82* 65*  ALT 77* 74* 53* 42  ALKPHOS 486* 317* 277* 312*  BILITOT 1.2 2.9* 2.8* 1.6*  PROT 6.8 5.6* 5.3* 5.0*  ALBUMIN 3.0* 2.4* 2.1* 2.0*   Recent Labs  Lab 08/13/22 0915  LIPASE 35   No results for input(s): "AMMONIA" in the last 168 hours. CBC: Recent Labs  Lab 08/13/22 0915 08/14/22 0419 08/15/22 0322 08/16/22 0258  WBC 17.8* 13.1* 9.4 8.0  NEUTROABS 16.6*  --   --   --   HGB 10.4* 8.6* 8.5* 8.0*  HCT 34.3* 28.1* 27.1* 26.1*  MCV 88.2 88.9 87.1 86.7  PLT 157 120* 134* 159  Anemia work up No results for input(s): "VITAMINB12", "FOLATE", "FERRITIN", "TIBC", "IRON", "RETICCTPCT" in the last 72 hours. Urinalysis    Component Value Date/Time   COLORURINE YELLOW 08/14/2022 [place] 08/14/2022 2225   LABSPEC 1.016 08/14/2022 2225   PHURINE 6.0 08/14/2022 2225    GLUCOSEU NEGATIVE 08/14/2022 2225   HGBUR MODERATE (A) 08/14/2022 2225   BILIRUBINUR NEGATIVE 08/14/2022 2225   KETONESUR NEGATIVE 08/14/2022 2225   PROTEINUR 100 (A) 08/14/2022 2225   NITRITE NEGATIVE 08/14/2022 2225   LEUKOCYTESUR NEGATIVE 08/14/2022 2225   Sepsis Labs Recent Labs  Lab 08/13/22 0915 08/14/22 0419 08/15/22 0322 08/16/22 0258  WBC 17.8* 13.1* 9.4 8.0   Microbiology Recent Results (from the past 240 hour(s))  Blood culture (routine x 2)     Status: Abnormal   Collection Time: 08/13/22 11:41 AM   Specimen: BLOOD LEFT WRIST  Result Value Ref Range Status   Specimen Description   Final    BLOOD LEFT WRIST Performed at [facility], [address], [place], [place] [address]    Special Requests   Final    BOTTLES DRAWN AEROBIC AND ANAEROBIC Blood Culture adequate volume Performed at [place] [address], [place], [place] [address]    Culture  Setup Time   Final    GRAM NEGATIVE RODS AEROBIC BOTTLE ONLY Organism ID to follow CRITICAL RESULT CALLED TO, READ BACK BY AND VERIFIED WITH: PHARMD MICHELLE BELL ON 12.18.23 AT 1054 BY EM Performed at [facility], [place] [address] [place], [place] [address]    Culture PSEUDOMONAS AERUGINOSA (A)  Final   Report Status 08/16/2022 FINAL  Final   Organism ID, Bacteria PSEUDOMONAS AERUGINOSA  Final      Susceptibility   Pseudomonas aeruginosa - MIC*    CEFTAZIDIME 2 SENSITIVE Sensitive     CIPROFLOXACIN 0.5 SENSITIVE Sensitive     GENTAMICIN 2 SENSITIVE Sensitive     IMIPENEM 1 SENSITIVE Sensitive     PIP/TAZO 8 SENSITIVE Sensitive     CEFEPIME 2 SENSITIVE Sensitive     * PSEUDOMONAS AERUGINOSA  Blood Culture ID Panel (Reflexed)     Status: Abnormal   Collection Time: 08/13/22 11:41 AM  Result Value Ref Range Status   Enterococcus faecalis NOT DETECTED NOT DETECTED Final   Enterococcus Faecium  NOT DETECTED NOT DETECTED Final   Listeria monocytogenes NOT DETECTED NOT DETECTED  Final   Staphylococcus species NOT DETECTED NOT DETECTED Final   Staphylococcus aureus (BCID) NOT DETECTED NOT DETECTED Final   Staphylococcus epidermidis NOT DETECTED NOT DETECTED Final   Staphylococcus lugdunensis NOT DETECTED NOT DETECTED Final   Streptococcus species NOT DETECTED NOT DETECTED Final   Streptococcus agalactiae NOT DETECTED NOT DETECTED Final   Streptococcus pneumoniae NOT DETECTED NOT DETECTED Final   Streptococcus pyogenes NOT DETECTED NOT DETECTED Final   A.calcoaceticus-baumannii NOT DETECTED NOT DETECTED Final   Bacteroides fragilis NOT DETECTED NOT DETECTED Final   Enterobacterales NOT DETECTED NOT DETECTED Final   Enterobacter cloacae complex NOT DETECTED NOT DETECTED Final   Escherichia coli NOT DETECTED NOT DETECTED Final   Klebsiella aerogenes NOT DETECTED NOT DETECTED Final   Klebsiella oxytoca NOT DETECTED NOT DETECTED Final   Klebsiella pneumoniae NOT DETECTED NOT DETECTED Final   Proteus species NOT DETECTED NOT DETECTED Final   Salmonella species NOT DETECTED NOT DETECTED Final   Serratia marcescens NOT DETECTED NOT DETECTED Final   Haemophilus influenzae NOT DETECTED NOT DETECTED Final   Neisseria meningitidis NOT DETECTED NOT DETECTED Final   Pseudomonas aeruginosa DETECTED (A) NOT DETECTED Final    Comment: CRITICAL RESULT CALLED TO, READ BACK BY AND VERIFIED WITH: M. BELL PHARMD, AT 1054 08/14/22 BY E. MILLER    Stenotrophomonas maltophilia NOT DETECTED NOT DETECTED Final   Candida albicans NOT DETECTED NOT DETECTED Final   Candida auris NOT DETECTED NOT DETECTED Final   Candida glabrata NOT DETECTED NOT DETECTED Final   Candida krusei NOT DETECTED NOT DETECTED Final   Candida parapsilosis NOT DETECTED NOT DETECTED Final   Candida tropicalis NOT DETECTED NOT DETECTED Final   Cryptococcus neoformans/gattii NOT DETECTED NOT DETECTED Final   CTX-M ESBL NOT DETECTED NOT DETECTED Final   Carbapenem resistance IMP NOT DETECTED NOT DETECTED Final    Carbapenem resistance KPC NOT DETECTED NOT DETECTED Final   Carbapenem resistance NDM NOT DETECTED NOT DETECTED Final   Carbapenem resistance VIM NOT DETECTED NOT DETECTED Final    Comment: Performed at [facility], [address] [place], [place] [address]  Resp panel by RT-PCR (RSV, Flu A&B, Covid) Anterior Nasal Swab     Status: None   Collection Time: 08/13/22 11:59 AM   Specimen: Anterior Nasal Swab  Result Value Ref Range Status   SARS Coronavirus 2 by RT PCR NEGATIVE NEGATIVE Final    Comment: (NOTE) SARS-CoV-2 target nucleic acids are NOT DETECTED.  The SARS-CoV-2 RNA is generally detectable in upper respiratory specimens during the acute phase of infection. The lowest concentration of SARS-CoV-2 viral copies this assay can detect is 138 copies/mL. A negative result does not preclude SARS-Cov-2 infection and should not be used as the sole basis for treatment or other patient management decisions. A negative result may occur with  improper specimen collection/handling, submission of specimen other than nasopharyngeal swab, presence of viral mutation(s) within the areas targeted by this assay, and inadequate number of viral copies(<138 copies/mL). A negative result must be combined with clinical observations, patient history, and epidemiological information. The expected result is Negative.  Fact Sheet for Patients:  EntrepreneurPulse.com.au  Fact Sheet for Healthcare Providers:  IncredibleEmployment.be  This test is no t yet approved or cleared by the [place] FDA and  has been authorized for detection and/or diagnosis of SARS-CoV-2 by FDA under an Emergency Use Authorization (EUA). This EUA will remain  in  effect (meaning this test can be used) for the duration of the COVID-19 declaration under Section 564(b)(1) of the Act, 21 U.S.C.section 360bbb-3(b)(1), unless the authorization is terminated  or revoked sooner.        Influenza A by PCR NEGATIVE NEGATIVE Final   Influenza B by PCR NEGATIVE NEGATIVE Final    Comment: (NOTE) The Xpert Xpress SARS-CoV-2/FLU/RSV plus assay is intended as an aid in the diagnosis of influenza from Nasopharyngeal swab specimens and should not be used as a sole basis for treatment. Nasal washings and aspirates are unacceptable for Xpert Xpress SARS-CoV-2/FLU/RSV testing.  Fact Sheet for Patients: EntrepreneurPulse.com.au  Fact Sheet for Healthcare Providers: IncredibleEmployment.be  This test is not yet approved or cleared by the [place] FDA and has been authorized for detection and/or diagnosis of SARS-CoV-2 by FDA under an Emergency Use Authorization (EUA). This EUA will remain in effect (meaning this test can be used) for the duration of the COVID-19 declaration under Section 564(b)(1) of the Act, 21 U.S.C. section 360bbb-3(b)(1), unless the authorization is terminated or revoked.     Resp Syncytial Virus by PCR NEGATIVE NEGATIVE Final    Comment: (NOTE) Fact Sheet for Patients: EntrepreneurPulse.com.au  Fact Sheet for Healthcare Providers: IncredibleEmployment.be  This test is not yet approved or cleared by the [place] FDA and has been authorized for detection and/or diagnosis of SARS-CoV-2 by FDA under an Emergency Use Authorization (EUA). This EUA will remain in effect (meaning this test can be used) for the duration of the COVID-19 declaration under Section 564(b)(1) of the Act, 21 U.S.C. section 360bbb-3(b)(1), unless the authorization is terminated or revoked.  Performed at [facility], [place] [address], [place], [place] [address]   MRSA Next Gen by PCR, Nasal     Status: None   Collection Time: 08/14/22 10:23 AM   Specimen: Nasal Mucosa; Nasal Swab  Result Value Ref Range Status   MRSA by PCR Next Gen NOT DETECTED NOT DETECTED Final    Comment:  (NOTE) The GeneXpert MRSA Assay (FDA approved for NASAL specimens only), is one component of a comprehensive MRSA colonization surveillance program. It is not intended to diagnose MRSA infection nor to guide or monitor treatment for MRSA infections. Test performance is not FDA approved in patients less than 8 years old. Performed at [facility], [place] [address] [place], [place] [address]   Respiratory (~20 pathogens) panel by PCR     Status: None   Collection Time: 08/14/22 10:49 AM   Specimen: Nasal Mucosa; Respiratory  Result Value Ref Range Status   Adenovirus NOT DETECTED NOT DETECTED Final   Coronavirus 229E NOT DETECTED NOT DETECTED Final    Comment: (NOTE) The Coronavirus on the Respiratory Panel, DOES NOT test for the novel  Coronavirus (2019 nCoV)    Coronavirus HKU1 NOT DETECTED NOT DETECTED Final   Coronavirus NL63 NOT DETECTED NOT DETECTED Final   Coronavirus OC43 NOT DETECTED NOT DETECTED Final   Metapneumovirus NOT DETECTED NOT DETECTED Final   Rhinovirus / Enterovirus NOT DETECTED NOT DETECTED Final   Influenza A NOT DETECTED NOT DETECTED Final   Influenza B NOT DETECTED NOT DETECTED Final   Parainfluenza Virus 1 NOT DETECTED NOT DETECTED Final   Parainfluenza Virus 2 NOT DETECTED NOT DETECTED Final   Parainfluenza Virus 3 NOT DETECTED NOT DETECTED Final   Parainfluenza Virus 4 NOT DETECTED NOT DETECTED Final   Respiratory Syncytial Virus NOT DETECTED NOT DETECTED Final   Bordetella pertussis NOT DETECTED  NOT DETECTED Final   Bordetella Parapertussis NOT DETECTED NOT DETECTED Final   Chlamydophila pneumoniae NOT DETECTED NOT DETECTED Final   Mycoplasma pneumoniae NOT DETECTED NOT DETECTED Final    Comment: Performed at [facility], [place] [address], [place], [place] [address]  Expectorated Sputum Assessment w Gram Stain, Rflx to Resp Cult     Status: None   Collection Time: 08/14/22 10:49 AM   Specimen: Expectorated Sputum  Result Value  Ref Range Status   Specimen Description EXPECTORATED SPUTUM  Final   Special Requests NONE  Final   Sputum evaluation   Final    THIS SPECIMEN IS ACCEPTABLE FOR SPUTUM CULTURE Performed at [facility], [place] [address], [place], [place] [address]    Report Status 08/14/2022 FINAL  Final  Culture, Respiratory w Gram Stain     Status: None (Preliminary result)   Collection Time: 08/14/22 10:49 AM  Result Value Ref Range Status   Specimen Description   Final    EXPECTORATED SPUTUM Performed at [place] [address], [place], [place] [address]    Special Requests   Final    NONE Reflexed from [phone] Performed at [facility], [place] [address], [place], [place] [address]    Gram Stain NO WBC SEEN NO ORGANISMS SEEN   Final   Culture   Final    CULTURE REINCUBATED FOR BETTER GROWTH Performed at [facility], [place] [address], [place], [place] [address]    Report Status PENDING  Incomplete  Time coordinating discharge: 25 minutes SIGNED: Antonieta Pert, MD  [facility] 08/17/2022, 9:09 AM  If 7PM-7AM, please contact night-coverage [email or web address]

## 2022-08-28 DEATH — deceased

## 2022-09-07 ENCOUNTER — Inpatient Hospital Stay: Payer: Medicare Other | Admitting: Oncology

## 2022-09-07 ENCOUNTER — Telehealth: Payer: Self-pay | Admitting: *Deleted

## 2022-09-07 NOTE — Telephone Encounter (Signed)
Called daughter to determine if Selenia was coming in today. Ms. Valarie Merino reports she died at [facility] on September 23, 2023.  MD made aware.

## 2022-10-31 ENCOUNTER — Encounter (HOSPITAL_COMMUNITY): Payer: Medicare Other

## 2023-04-05 IMAGING — DX DG CHEST 1V PORT
1 series · 1 of 1 positions shown · non-contrast
Comparison: 03/08/2021

CLINICAL DATA: Shortness of breath

EXAM:
PORTABLE CHEST 1 VIEW

[chest ap]
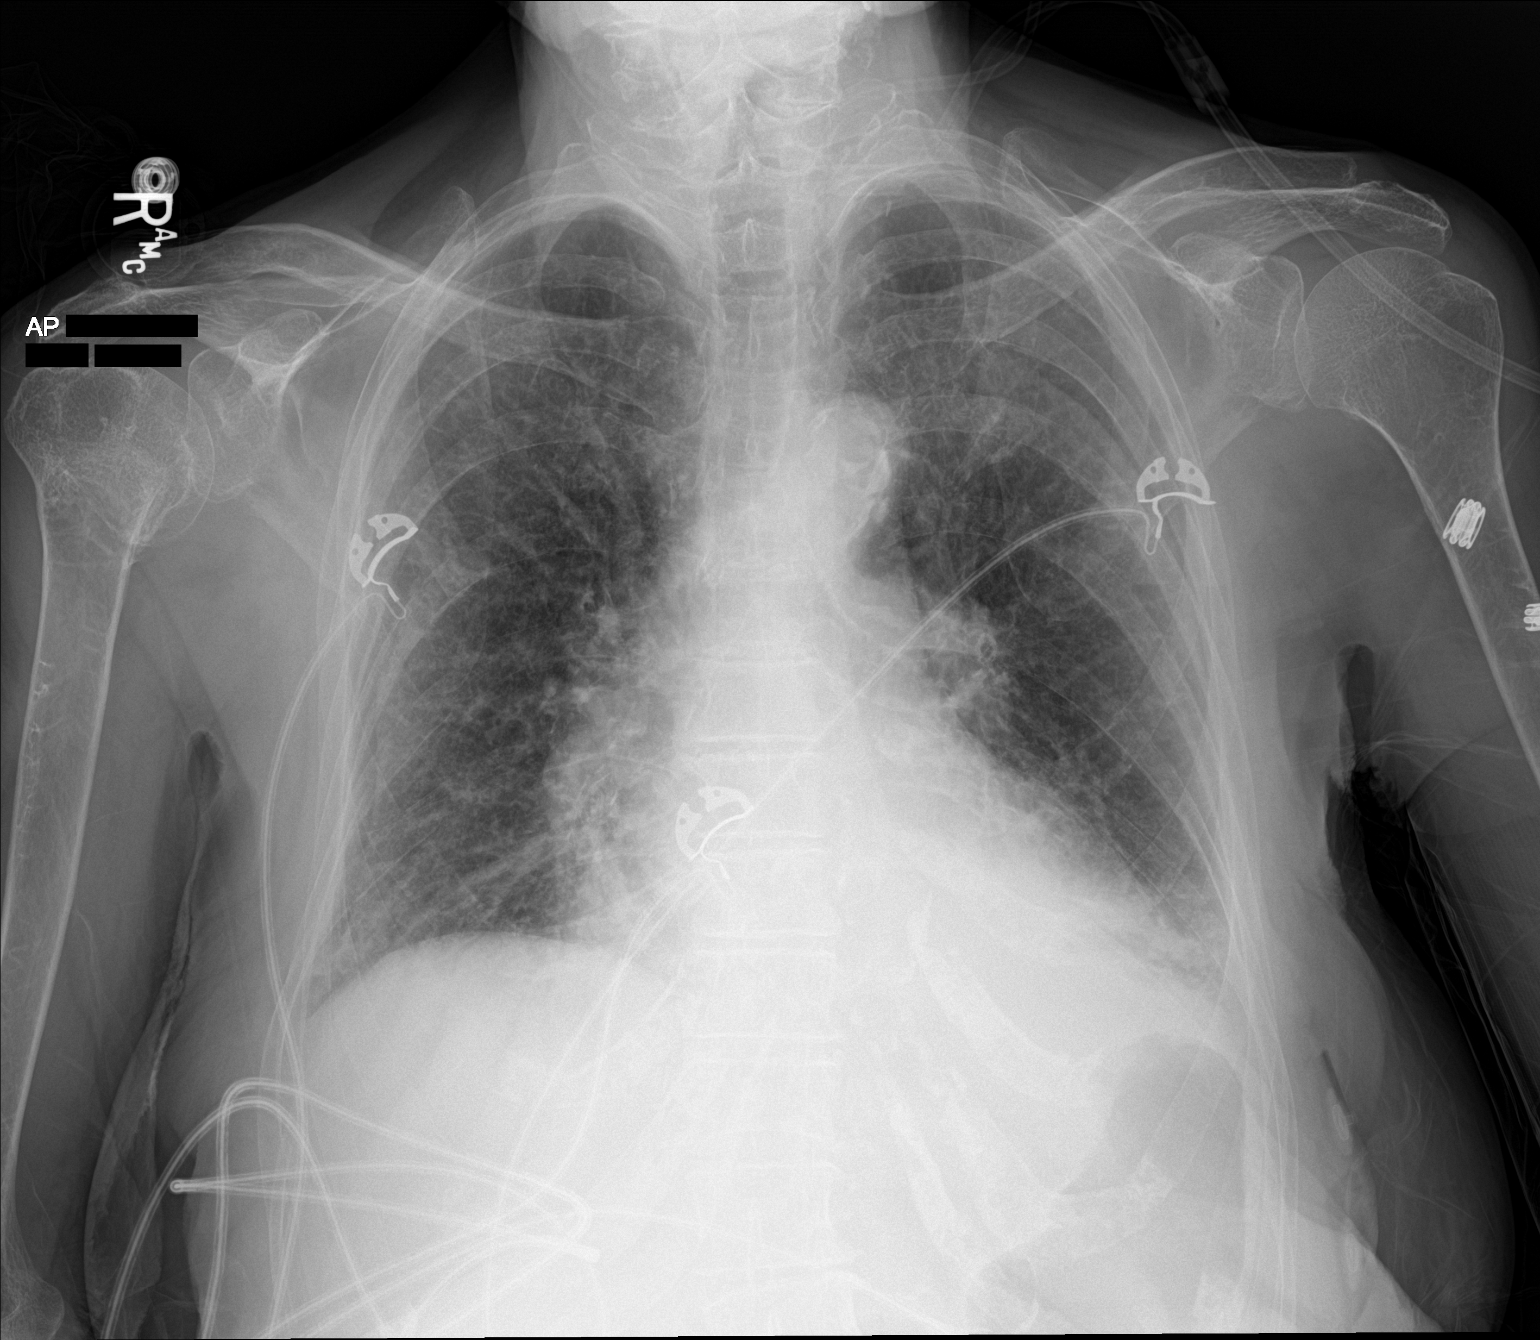

[1 of 1 positions shown; findings below may reference images not displayed]

FINDINGS: Stable cardiomediastinal contours. Aortic atherosclerosis. Streaky
left lower lobe airspace opacity. No large pleural fluid collection.
No pneumothorax. Bones are demineralized. Healed proximal right
humeral deformity.
IMPRESSION: Streaky left lower lobe airspace opacity, atelectasis versus
pneumonia.
